# Patient Record
Sex: Female | Born: 1950 | Race: White | Hispanic: No | Marital: Married | Smoking: Never smoker
Health system: Southern US, Community
[De-identification: ages and names within clinical notes are randomized; demographics above are authoritative.]

## PROBLEM LIST (undated history)

## (undated) DIAGNOSIS — E039 Hypothyroidism, unspecified: Secondary | ICD-10-CM

## (undated) DIAGNOSIS — C50919 Malignant neoplasm of unspecified site of unspecified female breast: Secondary | ICD-10-CM

## (undated) DIAGNOSIS — Z923 Personal history of irradiation: Secondary | ICD-10-CM

## (undated) DIAGNOSIS — C801 Malignant (primary) neoplasm, unspecified: Secondary | ICD-10-CM

## (undated) DIAGNOSIS — C50411 Malignant neoplasm of upper-outer quadrant of right female breast: Secondary | ICD-10-CM

## (undated) DIAGNOSIS — C50412 Malignant neoplasm of upper-outer quadrant of left female breast: Secondary | ICD-10-CM

## (undated) DIAGNOSIS — IMO0001 Reserved for inherently not codable concepts without codable children: Secondary | ICD-10-CM

## (undated) DIAGNOSIS — D219 Benign neoplasm of connective and other soft tissue, unspecified: Secondary | ICD-10-CM

## (undated) DIAGNOSIS — IMO0002 Reserved for concepts with insufficient information to code with codable children: Secondary | ICD-10-CM

## (undated) HISTORY — PX: REFRACTIVE SURGERY: SHX103

## (undated) HISTORY — DX: Reserved for concepts with insufficient information to code with codable children: IMO0002

## (undated) HISTORY — PX: BREAST BIOPSY: SHX20

## (undated) HISTORY — PX: COLONOSCOPY: SHX174

## (undated) HISTORY — PX: BREAST SURGERY: SHX581

## (undated) HISTORY — DX: Reserved for inherently not codable concepts without codable children: IMO0001

## (undated) HISTORY — PX: TONSILLECTOMY AND ADENOIDECTOMY: SUR1326

## (undated) HISTORY — PX: BREAST LUMPECTOMY: SHX2

## (undated) HISTORY — PX: MYOMECTOMY: SHX85

## (undated) HISTORY — DX: Hypothyroidism, unspecified: E03.9

## (undated) HISTORY — DX: Benign neoplasm of connective and other soft tissue, unspecified: D21.9

---

## 2000-02-14 ENCOUNTER — Encounter: Admission: RE | Admit: 2000-02-14 | Discharge: 2000-02-14 | Payer: Self-pay | Admitting: Obstetrics and Gynecology

## 2000-02-14 ENCOUNTER — Encounter: Payer: Self-pay | Admitting: Obstetrics and Gynecology

## 2001-02-23 ENCOUNTER — Encounter: Payer: Self-pay | Admitting: Obstetrics and Gynecology

## 2001-02-23 ENCOUNTER — Encounter: Admission: RE | Admit: 2001-02-23 | Discharge: 2001-02-23 | Payer: Self-pay | Admitting: Obstetrics and Gynecology

## 2002-06-04 ENCOUNTER — Encounter: Payer: Self-pay | Admitting: Obstetrics and Gynecology

## 2002-06-04 ENCOUNTER — Encounter: Admission: RE | Admit: 2002-06-04 | Discharge: 2002-06-04 | Payer: Self-pay | Admitting: Obstetrics and Gynecology

## 2002-11-22 ENCOUNTER — Observation Stay (HOSPITAL_COMMUNITY): Admission: RE | Admit: 2002-11-22 | Discharge: 2002-11-23 | Payer: Self-pay | Admitting: Orthopedic Surgery

## 2003-06-07 ENCOUNTER — Encounter: Admission: RE | Admit: 2003-06-07 | Discharge: 2003-06-07 | Payer: Self-pay | Admitting: Obstetrics and Gynecology

## 2003-06-07 ENCOUNTER — Encounter: Payer: Self-pay | Admitting: Obstetrics and Gynecology

## 2003-09-24 HISTORY — PX: SHOULDER SURGERY: SHX246

## 2004-06-07 ENCOUNTER — Encounter: Admission: RE | Admit: 2004-06-07 | Discharge: 2004-06-07 | Payer: Self-pay | Admitting: Obstetrics and Gynecology

## 2005-06-18 ENCOUNTER — Encounter: Admission: RE | Admit: 2005-06-18 | Discharge: 2005-06-18 | Payer: Self-pay | Admitting: Obstetrics and Gynecology

## 2006-06-26 ENCOUNTER — Encounter: Admission: RE | Admit: 2006-06-26 | Discharge: 2006-06-26 | Payer: Self-pay | Admitting: Obstetrics and Gynecology

## 2007-06-29 ENCOUNTER — Encounter: Admission: RE | Admit: 2007-06-29 | Discharge: 2007-06-29 | Payer: Self-pay | Admitting: Obstetrics and Gynecology

## 2008-08-23 ENCOUNTER — Encounter: Admission: RE | Admit: 2008-08-23 | Discharge: 2008-08-23 | Payer: Self-pay | Admitting: Obstetrics and Gynecology

## 2009-09-18 ENCOUNTER — Encounter: Admission: RE | Admit: 2009-09-18 | Discharge: 2009-09-18 | Payer: Self-pay | Admitting: Internal Medicine

## 2010-10-03 ENCOUNTER — Encounter
Admission: RE | Admit: 2010-10-03 | Discharge: 2010-10-03 | Payer: Self-pay | Source: Home / Self Care | Attending: Internal Medicine | Admitting: Internal Medicine

## 2011-02-08 NOTE — Op Note (Signed)
   NAMEMEEGHAN, Valerie Heath                         ACCOUNT NO.:  [id number]   MEDICAL RECORD NO.:  [id number]                   PATIENT TYPE:  AMB   LOCATION:  DAY                                  FACILITY:  [facility]   PHYSICIAN:  Georges Lynch. Gioffre, M.D.             DATE OF BIRTH:  06/17/1951   DATE OF PROCEDURE:  11/22/2002  DATE OF DISCHARGE:                                 OPERATIVE REPORT   PREOPERATIVE DIAGNOSIS:  1. Small tear of the rotator cuff tendon on the left.  2. Severe impingement syndrome, left shoulder.   POSTOPERATIVE DIAGNOSIS:  1. Small tear of the rotator cuff tendon on the left.  2. Severe impingement syndrome, left shoulder.   OPERATION PERFORMED:  1. Open decompression by performing a partial acromionectomy, left shoulder.  2. Tear of a small hole in the rotator cuff tendon, left shoulder.   SURGEON:  Georges Lynch. Darrelyn Hillock, M.D.   ASSISTANT:  Ebbie Ridge. Paitsel, P.A.   ANESTHESIA:  General.   DESCRIPTION OF PROCEDURE:  Prior to general anesthesia the patient had an  interscalene block.  A routine orthopedic prep and drape of the left  shoulder was carried out.  Incision was made over the anterior aspect of the  left shoulder, the bleeders were identified and cauterized.  At this time  the deltoid tendon was stripped from the acromion by sharp dissection.  She  had a markedly thickened acromion that literally put a small hole in her  rotator cuff tendon. One could note that with abduction, she literally had  no room between the rotator cuff and the acromion.  So I then protected the  rotator cuff with the Bennett retractor and then did a partial  acromionectomy with the oscillating saw.  I then utilized the bur to even  out the raw bone and the uneven bone end.  We now had a sufficient amount of  room for abduction.  I was able to take her on through a good range of  motion.  I then removed the subdeltoid bursa and then repaired the small  hole in the rotator cuff  with #1 Tycron suture.  I thoroughly irrigated out  the area and then repaired the deltoid tendon and muscle in the usual  fashion.  Subcu was closed with 0 Vicryl, skin with metal staples.  A  sterile Neosporin dressing was applied.  She was placed in a sling.  She had  1g of IV Ancef preop.                                                Ronald A. Darrelyn Hillock, M.D.    RAG/MEDQ  D:  11/22/2002  T:  11/22/2002  Job:  [id number]

## 2011-04-11 ENCOUNTER — Other Ambulatory Visit: Payer: Self-pay | Admitting: Gynecology

## 2011-04-11 ENCOUNTER — Other Ambulatory Visit (HOSPITAL_COMMUNITY)
Admission: RE | Admit: 2011-04-11 | Discharge: 2011-04-11 | Disposition: A | Discharge status: Home / Self Care | Payer: 59 | Source: Ambulatory Visit | Attending: Gynecology | Admitting: Gynecology

## 2011-04-11 ENCOUNTER — Ambulatory Visit (INDEPENDENT_AMBULATORY_CARE_PROVIDER_SITE_OTHER): Payer: 59 | Admitting: Gynecology

## 2011-04-11 DIAGNOSIS — Z01419 Encounter for gynecological examination (general) (routine) without abnormal findings: Secondary | ICD-10-CM

## 2011-04-11 DIAGNOSIS — Z124 Encounter for screening for malignant neoplasm of cervix: Secondary | ICD-10-CM | POA: Insufficient documentation

## 2011-04-11 DIAGNOSIS — N952 Postmenopausal atrophic vaginitis: Secondary | ICD-10-CM

## 2011-04-16 ENCOUNTER — Encounter: Payer: Self-pay | Admitting: *Deleted

## 2011-10-16 ENCOUNTER — Other Ambulatory Visit: Payer: Self-pay | Admitting: Gynecology

## 2011-10-16 DIAGNOSIS — Z1231 Encounter for screening mammogram for malignant neoplasm of breast: Secondary | ICD-10-CM

## 2011-11-04 ENCOUNTER — Ambulatory Visit
Admission: RE | Admit: 2011-11-04 | Discharge: 2011-11-04 | Disposition: A | Discharge status: Home / Self Care | Payer: 59 | Source: Ambulatory Visit | Attending: Gynecology | Admitting: Gynecology

## 2011-11-04 DIAGNOSIS — Z1231 Encounter for screening mammogram for malignant neoplasm of breast: Secondary | ICD-10-CM

## 2012-04-14 ENCOUNTER — Encounter: Payer: Self-pay | Admitting: Gynecology

## 2012-04-14 ENCOUNTER — Ambulatory Visit (INDEPENDENT_AMBULATORY_CARE_PROVIDER_SITE_OTHER): Payer: BC Managed Care – PPO | Admitting: Gynecology

## 2012-04-14 VITALS — BP 124/70 | Ht 64.0 in | Wt 142.0 lb

## 2012-04-14 DIAGNOSIS — N952 Postmenopausal atrophic vaginitis: Secondary | ICD-10-CM

## 2012-04-14 DIAGNOSIS — Z01419 Encounter for gynecological examination (general) (routine) without abnormal findings: Secondary | ICD-10-CM

## 2012-04-14 NOTE — Progress Notes (Signed)
Valerie Heath [birth year] [id number]        61 y.o.  G0P0 for annual exam.  Doing well without complaints.  Past medical history,surgical history, medications, allergies, family history and social history were all reviewed and documented in the EPIC chart. ROS:  Was performed and pertinent positives and negatives are included in the history.  Exam: Kim assistant Filed Vitals:   04/14/12 1532  BP: 124/70  Height: 5\' 4"  (1.626 m)  Weight: 142 lb (64.411 kg)   General appearance  Normal Skin grossly normal Head/Neck normal with no cervical or supraclavicular adenopathy thyroid normal Lungs  clear Cardiac RR, without RMG Abdominal  soft, nontender, without masses, organomegaly or hernia Breasts  examined lying and sitting without masses, retractions, discharge or axillary adenopathy. Pelvic  Ext/BUS/vagina  normal mild atrophic changes  Cervix  normal   Uterus  anteverted, normal size, shape and contour, midline and mobile nontender   Adnexa  Without masses or tenderness    Anus and perineum  normal   Rectovaginal  normal sphincter tone without palpated masses or tenderness.    Assessment/Plan:  61 y.o. G0P0 female for annual exam.   1. Postmenopausal. Mild atrophic genital changes. Patient doing well without symptoms/bleeding. We'll continue to monitor. 2. Pap smear. Last Pap 2012. No Pap done today. No history of abnormal Pap smears before. We'll plan less frequent screening per current screening guidelines every 3-5 years. 3. Mammography. Patient had mammogram January 2013. Continue on an annual basis. SBE monthly reviewed. 4. DEXA. Patient had DEXA through Dr. Alver Fisher office last year. We'll follow up with him in reference to this. 5. Colonoscopy. Patient had colonoscopy 2 years ago. We'll follow up with their recommended screening interval. 6. Health maintenance. No blood work done today as it's all done through Dr. Leandro Reasoner office who actively sees her. Follow up in one year,  sooner as needed.    Dara Lords MD, 4:39 PM 04/14/2012

## 2012-04-14 NOTE — Patient Instructions (Signed)
Follow up in one year for annual gynecologic exam. 

## 2012-07-02 ENCOUNTER — Encounter: Payer: Self-pay | Admitting: Cardiology

## 2012-10-07 ENCOUNTER — Other Ambulatory Visit: Payer: Self-pay | Admitting: Gynecology

## 2012-10-07 DIAGNOSIS — Z1231 Encounter for screening mammogram for malignant neoplasm of breast: Secondary | ICD-10-CM

## 2012-11-06 ENCOUNTER — Ambulatory Visit: Payer: BC Managed Care – PPO

## 2012-11-09 ENCOUNTER — Ambulatory Visit
Admission: RE | Admit: 2012-11-09 | Discharge: 2012-11-09 | Disposition: A | Discharge status: Home / Self Care | Payer: BC Managed Care – PPO | Source: Ambulatory Visit | Attending: Gynecology | Admitting: Gynecology

## 2012-11-09 DIAGNOSIS — Z1231 Encounter for screening mammogram for malignant neoplasm of breast: Secondary | ICD-10-CM

## 2013-04-20 ENCOUNTER — Encounter: Payer: Self-pay | Admitting: Gynecology

## 2013-04-20 ENCOUNTER — Ambulatory Visit (INDEPENDENT_AMBULATORY_CARE_PROVIDER_SITE_OTHER): Payer: BC Managed Care – PPO | Admitting: Gynecology

## 2013-04-20 VITALS — BP 120/70 | Ht 65.0 in | Wt 144.0 lb

## 2013-04-20 DIAGNOSIS — L989 Disorder of the skin and subcutaneous tissue, unspecified: Secondary | ICD-10-CM

## 2013-04-20 DIAGNOSIS — N952 Postmenopausal atrophic vaginitis: Secondary | ICD-10-CM

## 2013-04-20 DIAGNOSIS — Z01419 Encounter for gynecological examination (general) (routine) without abnormal findings: Secondary | ICD-10-CM

## 2013-04-20 DIAGNOSIS — M899 Disorder of bone, unspecified: Secondary | ICD-10-CM

## 2013-04-20 DIAGNOSIS — M8580 Other specified disorders of bone density and structure, unspecified site: Secondary | ICD-10-CM

## 2013-04-20 NOTE — Patient Instructions (Signed)
Office will call you with biopsy results 

## 2013-04-20 NOTE — Progress Notes (Signed)
Valerie Heath Sep 29, 1950 [id number]        62 y.o.  G0P0 for annual exam.  Several issues noted below.  Past medical history,surgical history, medications, allergies, family history and social history were all reviewed and documented in the EPIC chart.  ROS:  Performed and pertinent positives and negatives are included in the history, assessment and plan .  Exam: Kim assistant Filed Vitals:   04/20/13 1131  BP: 120/70  Height: 5\' 5"  (1.651 m)  Weight: 144 lb (65.318 kg)   General appearance  Normal Skin grossly normal Head/Neck normal with no cervical or supraclavicular adenopathy thyroid normal Lungs  clear Cardiac RR, without RMG Abdominal  soft, nontender, without masses, organomegaly or hernia Breasts  examined lying and sitting without masses, retractions, discharge or axillary adenopathy. Pelvic  Ext/BUS/vagina  hyperkeratotic skin, thick plaque-like surrounding anus and extending to the perineal body.  Cervix  normal with atrophic changes  Uterus  anteverted, normal size, shape and contour, midline and mobile nontender   Adnexa  Without masses or tenderness    Anus and perineum  normal   Rectovaginal  normal sphincter tone without palpated masses or tenderness.    Assessment/Plan:  62 y.o. G0P0 female for annual exam.   1. Hyperkeratotic vulvar/perianal changes. Patient relates having pruritus chronically for years. Never appreciated to this extent previously. Discussed with patient she'll biopsy warranted and she agrees.  Subsequently representative area right lateral to anus cleansed with Betadine, infiltrated with 1% lidocaine and a small skin biopsy taken. Silver nitrate applied afterwards. Patient to followup for biopsy results and treatment plan. 2. Postmenopausal. Patient without significant symptoms of hot flushes night sweats vaginal dryness or dyspareunia. Patient knows to report any bleeding. 3. Osteopenia. Being followed by Dr. Clelia Croft and will continue to  followup with him. Increase calcium item and they discussed. 4. Pap smear 2012. No Pap smear done today. No history of abnormal Pap smears. Plan repeat Pap smear next year at 3 year interval. 5. Colonoscopy 2011. Repeated today are recommended interval. 6. Mammography 10/2012. Continued annual mammography. 7. Health maintenance. The lab work done as this is all done through Dr. Alver Fisher office. Followup for biopsy results.  Note: This document was prepared with digital dictation and possible smart phrase technology. Any transcriptional errors that result from this process are unintentional.   Dara Lords MD, 11:59 AM 04/20/2013

## 2013-04-23 ENCOUNTER — Other Ambulatory Visit: Payer: Self-pay | Admitting: Gynecology

## 2013-04-23 MED ORDER — CLOBETASOL PROPIONATE 0.05 % EX CREA: TOPICAL_CREAM | Freq: Every day | CUTANEOUS | Status: DC

## 2013-06-08 ENCOUNTER — Encounter: Payer: Self-pay | Admitting: Gynecology

## 2013-06-08 ENCOUNTER — Ambulatory Visit (INDEPENDENT_AMBULATORY_CARE_PROVIDER_SITE_OTHER): Payer: BC Managed Care – PPO | Admitting: Gynecology

## 2013-06-08 DIAGNOSIS — L900 Lichen sclerosus et atrophicus: Secondary | ICD-10-CM

## 2013-06-08 DIAGNOSIS — L940 Localized scleroderma [morphea]: Secondary | ICD-10-CM

## 2013-06-08 NOTE — Progress Notes (Signed)
Patient presents for followup exam. Had hyperkeratotic area July 2014 which was biopsied and showed lichen sclerosis. Has been using Temovate cream nightly and has done great with resolution of her itching irritated symptoms.  Exam with Selena Batten assistant External BUS vagina with whitish lichen sclerosis changes bilateral posterior fourchette and perianal region. No hyperkeratotic areas or other lesions. Vaginal exam with atrophic changes.  Assessment and plan: Lichen sclerosis, responding to Temovate. Will wean off of the Temovate now. Will use when necessary for symptom relief. Followup in July 2015 when due for annual exam. Sooner if any issues.

## 2013-06-08 NOTE — Patient Instructions (Signed)
Continue with the Temovate cream as needed. Follow up in July for your annual exam.

## 2013-11-29 ENCOUNTER — Other Ambulatory Visit: Payer: Self-pay

## 2013-11-29 DIAGNOSIS — Z1231 Encounter for screening mammogram for malignant neoplasm of breast: Secondary | ICD-10-CM

## 2013-11-29 DIAGNOSIS — Z803 Family history of malignant neoplasm of breast: Secondary | ICD-10-CM

## 2013-12-21 ENCOUNTER — Ambulatory Visit
Admission: RE | Admit: 2013-12-21 | Discharge: 2013-12-21 | Disposition: A | Discharge status: Home / Self Care | Payer: PRIVATE HEALTH INSURANCE | Source: Ambulatory Visit

## 2013-12-21 DIAGNOSIS — Z1231 Encounter for screening mammogram for malignant neoplasm of breast: Secondary | ICD-10-CM

## 2013-12-21 DIAGNOSIS — Z803 Family history of malignant neoplasm of breast: Secondary | ICD-10-CM

## 2013-12-23 ENCOUNTER — Other Ambulatory Visit: Payer: Self-pay | Admitting: Gynecology

## 2013-12-23 DIAGNOSIS — R928 Other abnormal and inconclusive findings on diagnostic imaging of breast: Secondary | ICD-10-CM

## 2013-12-29 ENCOUNTER — Other Ambulatory Visit: Payer: Self-pay | Admitting: Gynecology

## 2013-12-29 ENCOUNTER — Ambulatory Visit
Admission: RE | Admit: 2013-12-29 | Discharge: 2013-12-29 | Disposition: A | Discharge status: Home / Self Care | Payer: PRIVATE HEALTH INSURANCE | Source: Ambulatory Visit | Attending: Gynecology | Admitting: Gynecology

## 2013-12-29 ENCOUNTER — Ambulatory Visit
Admission: RE | Admit: 2013-12-29 | Discharge: 2013-12-29 | Disposition: A | Discharge status: Home / Self Care | Payer: Self-pay | Source: Ambulatory Visit | Attending: Gynecology | Admitting: Gynecology

## 2013-12-29 DIAGNOSIS — R928 Other abnormal and inconclusive findings on diagnostic imaging of breast: Secondary | ICD-10-CM

## 2013-12-29 DIAGNOSIS — N6320 Unspecified lump in the left breast, unspecified quadrant: Secondary | ICD-10-CM

## 2014-01-04 ENCOUNTER — Ambulatory Visit
Admission: RE | Admit: 2014-01-04 | Discharge: 2014-01-04 | Disposition: A | Discharge status: Home / Self Care | Payer: PRIVATE HEALTH INSURANCE | Source: Ambulatory Visit | Attending: Gynecology | Admitting: Gynecology

## 2014-01-04 ENCOUNTER — Other Ambulatory Visit: Payer: Self-pay | Admitting: Gynecology

## 2014-01-04 DIAGNOSIS — N6320 Unspecified lump in the left breast, unspecified quadrant: Secondary | ICD-10-CM

## 2014-01-04 DIAGNOSIS — R928 Other abnormal and inconclusive findings on diagnostic imaging of breast: Secondary | ICD-10-CM

## 2014-01-25 ENCOUNTER — Encounter (INDEPENDENT_AMBULATORY_CARE_PROVIDER_SITE_OTHER): Payer: Self-pay | Admitting: General Surgery

## 2014-01-25 ENCOUNTER — Ambulatory Visit (INDEPENDENT_AMBULATORY_CARE_PROVIDER_SITE_OTHER): Payer: PRIVATE HEALTH INSURANCE | Admitting: General Surgery

## 2014-01-25 VITALS — BP 140/90 | HR 80 | Temp 97.8°F | Resp 14 | Ht 65.0 in | Wt 146.8 lb

## 2014-01-25 DIAGNOSIS — N6092 Unspecified benign mammary dysplasia of left breast: Secondary | ICD-10-CM | POA: Insufficient documentation

## 2014-01-25 DIAGNOSIS — N62 Hypertrophy of breast: Secondary | ICD-10-CM

## 2014-01-25 NOTE — Progress Notes (Signed)
Patient ID: Valerie Heath, female   DOB: 15-Aug-1951, 63 y.o.   MRN: [id number]  Chief Complaint  Patient presents with  . New Evaluation    Lft br atypical lobular hyperplasia    HPI Valerie Heath is a 63 y.o. female.  She is referred by Dr. Luberta Robertson for evaluation of atypical lobular hyperplasia left breast, 2:00 position.  The patient has no prior history of breast problems. She gets yearly mammograms. Recent mammogram shows a 1 cm area of distortion in the left breast at the 2:00 position. Image guided biopsy shows atypical lobular hyperplasia. She is referred for consideration of lumpectomy.  In the history is significant in that her mother developed some type of gynecologic cancer and then later on developed breast cancer in her 86s and she is now living at age 81. Father died of congestive heart failure but did have prostate cancer  The patient is otherwise healthy. She had a myomectomy through a Pfannenstiel incision and has hypothyroidism. Denies tobacco. Married with 2 children. 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 HPI  Past Medical History  Diagnosis Date  . Hypothyroid   . Fibroid     Past Surgical History  Procedure Laterality Date  . Tonsillectomy and adenoidectomy    . Myomectomy    . Shoulder surgery      Family History  Problem Relation Age of Onset  . Hypertension Mother   . Breast cancer Mother 17  . Cancer Mother     Fallopian tube tumor  . Diabetes Father   . Hypertension Father   . Heart disease Father   . Prostate cancer Father   . Colon cancer Paternal Uncle     Social History History  Substance Use Topics  . Smoking status: Never Smoker   . Smokeless tobacco: Not on file  . Alcohol Use: 1.0 oz/week    2 drink(s) per week    Allergies  Allergen Reactions  . Erythromycin Nausea Only    Current Outpatient Prescriptions  Medication Sig Dispense Refill  . Calcium Carbonate-Vitamin D (CALCIUM + D PO) Take  by mouth.      . Cholecalciferol (VITAMIN D PO) Take by mouth.      . Flaxseed Oil OIL 1,400 mg by Does not apply route 3 (three) times a week.      Marland Kitchen glucosamine-chondroitin 500-400 MG tablet Take 1,875 tablets by mouth 5 (five) times daily.      Marland Kitchen levothyroxine (SYNTHROID, LEVOTHROID) 125 MCG tablet Take 125 mcg by mouth daily.        . Multiple Vitamin (MULTIVITAMIN) tablet Take 1 tablet by mouth daily.      . Omega-3 Fatty Acids (FISH OIL PO) Take by mouth.       No current facility-administered medications for this visit.    Review of Systems Review of Systems  Constitutional: Negative for fever, chills and unexpected weight change.  HENT: Negative for congestion, hearing loss, sore throat, trouble swallowing and voice change.   Eyes: Negative for visual disturbance.  Respiratory: Negative for cough and wheezing.   Cardiovascular: Negative for chest pain, palpitations and leg swelling.  Gastrointestinal: Negative for nausea, vomiting, abdominal pain, diarrhea, constipation, blood in stool, abdominal distention and anal bleeding.  Genitourinary: Negative for hematuria, vaginal bleeding and difficulty urinating.  Musculoskeletal: Negative for arthralgias.  Skin: Negative for rash and wound.  Neurological: Negative for seizures, syncope and headaches.  Hematological: Negative for adenopathy. Does not bruise/bleed easily.  Psychiatric/Behavioral: Negative  for confusion.    Blood pressure 140/90, pulse 80, temperature 97.8 F (36.6 C), temperature source Temporal, resp. rate 14, height 5\' 5"  (1.651 m), weight 146 lb 12.8 oz (66.588 kg).  Physical Exam Physical Exam  Constitutional: She is oriented to person, place, and time. She appears well-developed and well-nourished. No distress.  HENT:  Head: Normocephalic and atraumatic.  Nose: Nose normal.  Mouth/Throat: No oropharyngeal exudate.  Eyes: Conjunctivae and EOM are normal. Pupils are equal, round, and reactive to light. Left  eye exhibits no discharge. No scleral icterus.  Neck: Neck supple. No JVD present. No tracheal deviation present. No thyromegaly present.  Cardiovascular: Normal rate, regular rhythm, normal heart sounds and intact distal pulses.   No murmur heard. Pulmonary/Chest: Effort normal and breath sounds normal. No respiratory distress. She has no wheezes. She has no rales. She exhibits no tenderness.  Small bruise left breast, 2:30 position. No other mass or skin change in either breast and no axillary adenopathy.  Abdominal: Soft. Bowel sounds are normal. She exhibits no distension and no mass. There is no tenderness. There is no rebound and no guarding.  Healed Pfannenstiel scar  Musculoskeletal: She exhibits no edema and no tenderness.  Lymphadenopathy:    She has no cervical adenopathy.  Neurological: She is alert and oriented to person, place, and time. She exhibits normal muscle tone. Coordination normal.  Skin: Skin is warm. No rash noted. She is not diaphoretic. No erythema. No pallor.  Psychiatric: She has a normal mood and affect. Her behavior is normal. Judgment and thought content normal.    Data Reviewed Imaging studies. Pathology report  Assessment    Atypical lobular hyperplasia, 2:00 position left breast, 1 cm area by mammography     Plan    Scheduled for a left breast lumpectomy with radioactive seed localization  I discussed the indications, details, techniques, and numerous risk of the surgery with the patient. She is aware of the risk of bleeding, infection, reoperation if this is cancer, cosmetic deformity, nerve damage with chronic pain or numbness, and other unforeseen problems. She understands all these issues. All of her questions are answered. She agrees with this plan.        Edsel Petrin. Dalbert Batman, M.D., [facility] General and Minimally invasive Surgery Breast and Colorectal Surgery Office:   [phone] Pager:    [phone]  01/25/2014, 12:44 PM

## 2014-01-25 NOTE — Patient Instructions (Signed)
Your image guided biopsy of the left breast shows atypical lobular hyperplasia.  Because of your family history of breast cancer, and because there is a possibility that you have an early cancer in the left breast already, this area should be conservatively excised with a lumpectomy.  You will be scheduled for a left breast lumpectomy with radioactive seed localization in the future.     Lumpectomy A lumpectomy is a form of "breast conserving" or "breast preservation" surgery. It may also be referred to as a partial mastectomy. During a lumpectomy, the portion of the breast that contains the cancerous tumor or breast mass (the lump) is removed. Some normal tissue around the lump may also be removed to make sure all the tumor has been removed. This surgery should take 40 minutes or less. LET [facility] CARE PROVIDER KNOW ABOUT:  Any allergies you have.  All medicines you are taking, including vitamins, herbs, eye drops, creams, and over-the-counter medicines.  Previous problems you or members of your family have had with the use of anesthetics.  Any blood disorders you have.  Previous surgeries you have had.  Medical conditions you have. RISKS AND COMPLICATIONS Generally, this is a safe procedure. However, as with any procedure, complications can occur. Possible complications include:  Bleeding.  Infection.  Pain.  Temporary swelling.  Change in the shape of the breast, particularly if a large portion is removed. BEFORE THE PROCEDURE  Ask your health care provider about changing or stopping your regular medicines.  Do not eat or drink anything for 7 8 hours before the surgery or as directed by your health care provider. Ask your health care provider if you can take a sip of water with any approved medicines.  On the day of surgery, your healthcare provider will use a mammogram or ultrasound to locate and mark the tumor in your breast. These markings on your breast will show  where the cut (incision) will be made. PROCEDURE   An IV tube will be put into one of your veins.  You may be given medicine to help you relax before the surgery (sedative). You will be given one of the following:  A medicine that numbs the area (local anesthesia).  A medicine that makes you go to sleep (general anesthesia).  Your health care provider will use a kind of electric scalpel that uses heat to minimize bleeding (electrocautery knife).  A curved incision (like a smile or frown) that follows the natural curve of your breast is made, to allow for minimal scarring and better healing.  The tumor will be removed with some of the surrounding tissue. This will be sent to the lab for analysis. Your health care provider may also remove your lymph nodes at this time if needed.  Sometimes, but not always, a rubber tube called a drain will be surgically inserted into your breast area or armpit to collect excess fluid that may accumulate in the space where the tumor was. This drain is connected to a plastic bulb on the outside of your body. This drain creates suction to help remove the fluid.  The incisions will be closed with stitches (sutures).  A bandage may be placed over the incisions. AFTER THE PROCEDURE  You will be taken to the recovery area.  You will be given medicine for pain.  A small rubber drain may be placed in the breast for 2 3 days to prevent a collection of blood (hematoma) from developing in the breast. You will  be given instructions on caring for the drain before you go home.  A pressure bandage (dressing) will be applied for 1 2 days to prevent bleeding. Ask your health care provider how to care for your bandage at home. Document Released: 10/21/2006 Document Revised: 05/12/2013 Document Reviewed: 02/12/2013 [facility] Patient Information 2014 [place].

## 2014-01-26 ENCOUNTER — Other Ambulatory Visit (INDEPENDENT_AMBULATORY_CARE_PROVIDER_SITE_OTHER): Payer: Self-pay | Admitting: General Surgery

## 2014-01-26 DIAGNOSIS — N6092 Unspecified benign mammary dysplasia of left breast: Secondary | ICD-10-CM

## 2014-01-27 ENCOUNTER — Encounter (HOSPITAL_BASED_OUTPATIENT_CLINIC_OR_DEPARTMENT_OTHER): Payer: Self-pay | Admitting: *Deleted

## 2014-02-17 NOTE — H&P (Signed)
Valerie Heath    MRN:  [id number]   Description: 63 year old female  Provider: Adin Hector, MD  Department: [facility]                Diagnoses      Atypical lobular hyperplasia of left breast    -  Primary      611.1           Current Vitals Most recent update: 01/25/2014 11:59 AM by Elwyn Lade, CMA      BP Pulse Temp(Src) Resp Ht Wt      140/90 80 97.8 F (36.6 C) (Temporal) 14 5\' 5"  (1.651 m) 146 lb 12.8 oz (66.588 kg)      BMI - 24.43 kg/m2                   History and Physical    Adin Hector, MD     Status: Signed            Patient ID: Valerie Heath, female   DOB: [birth year], 63 y.o.   MRN: [id number]                 HPI Valerie Heath is a 63 y.o. female.  She is referred by Dr. Luberta Robertson for evaluation of atypical lobular hyperplasia left breast, 2:00 position.   The patient has no prior history of breast problems. She gets yearly mammograms. Recent mammogram shows a 1 cm area of distortion in the left breast at the 2:00 position. Image guided biopsy shows atypical lobular hyperplasia. She is referred for consideration of lumpectomy.   In the history is significant in that her mother developed some type of gynecologic cancer and then later on developed breast cancer in her 18s and she is now living at age 57. Father died of congestive heart failure but did have prostate cancer   The patient is otherwise healthy. She had a myomectomy through a Pfannenstiel incision and has hypothyroidism. Denies tobacco. Married with 2 children. 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        Past Medical History   Diagnosis  Date   .  Hypothyroid     .  Fibroid           Past Surgical History   Procedure  Laterality  Date   .  Tonsillectomy and adenoidectomy       .  Myomectomy       .  Shoulder surgery             Family History   Problem  Relation  Age of Onset   .  Hypertension  Mother     .  Breast  cancer  Mother  66   .  Cancer  Mother         Fallopian tube tumor   .  Diabetes  Father     .  Hypertension  Father     .  Heart disease  Father     .  Prostate cancer  Father     .  Colon cancer  Paternal Uncle          Social History History   Substance Use Topics   .  Smoking status:  Never Smoker    .  Smokeless tobacco:  Not on file   .  Alcohol Use:  1.0 oz/week       2 drink(s) per week  Allergies   Allergen  Reactions   .  Erythromycin  Nausea Only         Current Outpatient Prescriptions   Medication  Sig  Dispense  Refill   .  Calcium Carbonate-Vitamin D (CALCIUM + D PO)  Take by mouth.         .  Cholecalciferol (VITAMIN D PO)  Take by mouth.         .  Flaxseed Oil OIL  1,400 mg by Does not apply route 3 (three) times a week.         Marland Kitchen  glucosamine-chondroitin 500-400 MG tablet  Take 1,875 tablets by mouth 5 (five) times daily.         Marland Kitchen  levothyroxine (SYNTHROID, LEVOTHROID) 125 MCG tablet  Take 125 mcg by mouth daily.           .  Multiple Vitamin (MULTIVITAMIN) tablet  Take 1 tablet by mouth daily.         .  Omega-3 Fatty Acids (FISH OIL PO)  Take by mouth.       .        Review of Systems   Constitutional: Negative for fever, chills and unexpected weight change.  HENT: Negative for congestion, hearing loss, sore throat, trouble swallowing and voice change.   Eyes: Negative for visual disturbance.  Respiratory: Negative for cough and wheezing.   Cardiovascular: Negative for chest pain, palpitations and leg swelling.  Gastrointestinal: Negative for nausea, vomiting, abdominal pain, diarrhea, constipation, blood in stool, abdominal distention and anal bleeding.  Genitourinary: Negative for hematuria, vaginal bleeding and difficulty urinating.  Musculoskeletal: Negative for arthralgias.  Skin: Negative for rash and wound.  Neurological: Negative for seizures, syncope and headaches.  Hematological: Negative for adenopathy. Does not  bruise/bleed easily.  Psychiatric/Behavioral: Negative for confusion.      Blood pressure 140/90, pulse 80, temperature 97.8 F (36.6 C), temperature source Temporal, resp. rate 14, height 5\' 5"  (1.651 m), weight 146 lb 12.8 oz (66.588 kg).   Physical Exam   Constitutional: She is oriented to person, place, and time. She appears well-developed and well-nourished. No distress.  HENT:   Head: Normocephalic and atraumatic.   Nose: Nose normal.   Mouth/Throat: No oropharyngeal exudate.  Eyes: Conjunctivae and EOM are normal. Pupils are equal, round, and reactive to light. Left eye exhibits no discharge. No scleral icterus.  Neck: Neck supple. No JVD present. No tracheal deviation present. No thyromegaly present.  Cardiovascular: Normal rate, regular rhythm, normal heart sounds and intact distal pulses.    No murmur heard. Pulmonary/Chest: Effort normal and breath sounds normal. No respiratory distress. She has no wheezes. She has no rales. She exhibits no tenderness.  Small bruise left breast, 2:30 position. No other mass or skin change in either breast and no axillary adenopathy.  Abdominal: Soft. Bowel sounds are normal. She exhibits no distension and no mass. There is no tenderness. There is no rebound and no guarding.  Healed Pfannenstiel scar  Musculoskeletal: She exhibits no edema and no tenderness.  Lymphadenopathy:    She has no cervical adenopathy.  Neurological: She is alert and oriented to person, place, and time. She exhibits normal muscle tone. Coordination normal.  Skin: Skin is warm. No rash noted. She is not diaphoretic. No erythema. No pallor.  Psychiatric: She has a normal mood and affect. Her behavior is normal. Judgment and thought content normal.      Data Reviewed Imaging studies. Pathology report   Assessment  Atypical lobular hyperplasia, 2:00 position left breast, 1 cm area by mammography      Plan    Scheduled for a left breast lumpectomy with  radioactive seed localization   I discussed the indications, details, techniques, and numerous risk of the surgery with the patient. She is aware of the risk of bleeding, infection, reoperation if this is cancer, cosmetic deformity, nerve damage with chronic pain or numbness, and other unforeseen problems. She understands all these issues. All of her questions are answered. She agrees with this plan.         Edsel Petrin. Dalbert Batman, M.D., [facility] General and Minimally invasive Surgery Breast and Colorectal Surgery Office:   [phone] Pager:   [phone]

## 2014-02-21 ENCOUNTER — Ambulatory Visit
Admission: RE | Admit: 2014-02-21 | Discharge: 2014-02-21 | Disposition: A | Discharge status: Home / Self Care | Payer: PRIVATE HEALTH INSURANCE | Source: Ambulatory Visit | Attending: General Surgery | Admitting: General Surgery

## 2014-02-21 ENCOUNTER — Encounter (HOSPITAL_BASED_OUTPATIENT_CLINIC_OR_DEPARTMENT_OTHER)
Admission: RE | Disposition: A | Discharge status: Home / Self Care | Payer: Self-pay | Source: Ambulatory Visit | Attending: General Surgery

## 2014-02-21 ENCOUNTER — Encounter (HOSPITAL_BASED_OUTPATIENT_CLINIC_OR_DEPARTMENT_OTHER): Payer: No Typology Code available for payment source | Admitting: Anesthesiology

## 2014-02-21 ENCOUNTER — Ambulatory Visit (HOSPITAL_BASED_OUTPATIENT_CLINIC_OR_DEPARTMENT_OTHER): Payer: No Typology Code available for payment source | Admitting: Anesthesiology

## 2014-02-21 ENCOUNTER — Ambulatory Visit (HOSPITAL_BASED_OUTPATIENT_CLINIC_OR_DEPARTMENT_OTHER)
Admission: RE | Admit: 2014-02-21 | Discharge: 2014-02-21 | Disposition: A | Discharge status: Home / Self Care | Payer: No Typology Code available for payment source | Source: Ambulatory Visit | Attending: General Surgery | Admitting: General Surgery

## 2014-02-21 ENCOUNTER — Encounter (HOSPITAL_BASED_OUTPATIENT_CLINIC_OR_DEPARTMENT_OTHER): Payer: Self-pay

## 2014-02-21 DIAGNOSIS — Z170 Estrogen receptor positive status [ER+]: Secondary | ICD-10-CM | POA: Insufficient documentation

## 2014-02-21 DIAGNOSIS — Z803 Family history of malignant neoplasm of breast: Secondary | ICD-10-CM | POA: Insufficient documentation

## 2014-02-21 DIAGNOSIS — Z8049 Family history of malignant neoplasm of other genital organs: Secondary | ICD-10-CM | POA: Insufficient documentation

## 2014-02-21 DIAGNOSIS — Z881 Allergy status to other antibiotic agents status: Secondary | ICD-10-CM | POA: Insufficient documentation

## 2014-02-21 DIAGNOSIS — N6092 Unspecified benign mammary dysplasia of left breast: Secondary | ICD-10-CM | POA: Diagnosis present

## 2014-02-21 DIAGNOSIS — C50419 Malignant neoplasm of upper-outer quadrant of unspecified female breast: Secondary | ICD-10-CM | POA: Insufficient documentation

## 2014-02-21 DIAGNOSIS — E039 Hypothyroidism, unspecified: Secondary | ICD-10-CM | POA: Insufficient documentation

## 2014-02-21 DIAGNOSIS — Z79899 Other long term (current) drug therapy: Secondary | ICD-10-CM | POA: Insufficient documentation

## 2014-02-21 DIAGNOSIS — D0590 Unspecified type of carcinoma in situ of unspecified breast: Secondary | ICD-10-CM

## 2014-02-21 HISTORY — PX: BREAST LUMPECTOMY WITH RADIOACTIVE SEED LOCALIZATION: SHX6424

## 2014-02-21 LAB — POCT HEMOGLOBIN-HEMACUE: Hemoglobin: 13.6 g/dL (ref 12.0–15.0)

## 2014-02-21 SURGERY — BREAST LUMPECTOMY WITH RADIOACTIVE SEED LOCALIZATION
Anesthesia: General | Site: Breast | Laterality: Left

## 2014-02-21 MED ORDER — OXYCODONE HCL 5 MG/5ML PO SOLN: 5.0000 mg | Freq: Once | ORAL | Status: AC | PRN

## 2014-02-21 MED ORDER — FENTANYL CITRATE 0.05 MG/ML IJ SOLN
INTRAMUSCULAR | Status: AC
  Filled 2014-02-21: qty 4

## 2014-02-21 MED ORDER — HYDROMORPHONE HCL PF 1 MG/ML IJ SOLN
INTRAMUSCULAR | Status: AC
  Filled 2014-02-21: qty 1

## 2014-02-21 MED ORDER — HYDROMORPHONE HCL PF 1 MG/ML IJ SOLN
0.2500 mg | INTRAMUSCULAR | Status: DC | PRN
  Administered 2014-02-21 (×2): 0.5 mg via INTRAVENOUS

## 2014-02-21 MED ORDER — DEXAMETHASONE SODIUM PHOSPHATE 4 MG/ML IJ SOLN
INTRAMUSCULAR | Status: DC | PRN
  Administered 2014-02-21: 10 mg via INTRAVENOUS

## 2014-02-21 MED ORDER — MEPERIDINE HCL 25 MG/ML IJ SOLN: 6.2500 mg | INTRAMUSCULAR | Status: DC | PRN

## 2014-02-21 MED ORDER — OXYCODONE HCL 5 MG PO TABS
ORAL_TABLET | ORAL | Status: AC
  Filled 2014-02-21: qty 1

## 2014-02-21 MED ORDER — MIDAZOLAM HCL 2 MG/2ML IJ SOLN
INTRAMUSCULAR | Status: AC
  Filled 2014-02-21: qty 2

## 2014-02-21 MED ORDER — BUPIVACAINE-EPINEPHRINE (PF) 0.5% -1:200000 IJ SOLN
INTRAMUSCULAR | Status: DC | PRN
  Administered 2014-02-21: 8 mL

## 2014-02-21 MED ORDER — CEFAZOLIN SODIUM-DEXTROSE 2-3 GM-% IV SOLR
2.0000 g | INTRAVENOUS | Status: AC
  Administered 2014-02-21: 2 g via INTRAVENOUS

## 2014-02-21 MED ORDER — MIDAZOLAM HCL 2 MG/2ML IJ SOLN: 1.0000 mg | INTRAMUSCULAR | Status: DC | PRN

## 2014-02-21 MED ORDER — CEFAZOLIN SODIUM-DEXTROSE 2-3 GM-% IV SOLR
INTRAVENOUS | Status: AC
  Filled 2014-02-21: qty 50

## 2014-02-21 MED ORDER — ONDANSETRON HCL 4 MG/2ML IJ SOLN
INTRAMUSCULAR | Status: DC | PRN
  Administered 2014-02-21: 4 mg via INTRAVENOUS

## 2014-02-21 MED ORDER — LACTATED RINGERS IV SOLN
INTRAVENOUS | Status: DC
  Administered 2014-02-21: 10:00:00 via INTRAVENOUS

## 2014-02-21 MED ORDER — PROMETHAZINE HCL 25 MG/ML IJ SOLN: 6.2500 mg | INTRAMUSCULAR | Status: DC | PRN

## 2014-02-21 MED ORDER — FENTANYL CITRATE 0.05 MG/ML IJ SOLN
INTRAMUSCULAR | Status: DC | PRN
  Administered 2014-02-21: 100 ug via INTRAVENOUS

## 2014-02-21 MED ORDER — PROPOFOL 10 MG/ML IV BOLUS
INTRAVENOUS | Status: AC
  Filled 2014-02-21: qty 20

## 2014-02-21 MED ORDER — CHLORHEXIDINE GLUCONATE 4 % EX LIQD: 1.0000 "application " | Freq: Once | CUTANEOUS | Status: DC

## 2014-02-21 MED ORDER — OXYCODONE HCL 5 MG PO TABS
5.0000 mg | ORAL_TABLET | Freq: Once | ORAL | Status: AC | PRN
  Administered 2014-02-21: 5 mg via ORAL

## 2014-02-21 MED ORDER — FENTANYL CITRATE 0.05 MG/ML IJ SOLN: 50.0000 ug | INTRAMUSCULAR | Status: DC | PRN

## 2014-02-21 MED ORDER — PROPOFOL 10 MG/ML IV BOLUS
INTRAVENOUS | Status: DC | PRN
  Administered 2014-02-21: 140 mg via INTRAVENOUS

## 2014-02-21 MED ORDER — MIDAZOLAM HCL 5 MG/5ML IJ SOLN
INTRAMUSCULAR | Status: DC | PRN
  Administered 2014-02-21: 2 mg via INTRAVENOUS

## 2014-02-21 MED ORDER — HYDROCODONE-ACETAMINOPHEN 5-325 MG PO TABS: 1.0000 | ORAL_TABLET | Freq: Four times a day (QID) | ORAL | Status: DC | PRN

## 2014-02-21 MED ORDER — MIDAZOLAM HCL 2 MG/2ML IJ SOLN: 0.5000 mg | Freq: Once | INTRAMUSCULAR | Status: DC | PRN

## 2014-02-21 MED ORDER — LIDOCAINE HCL (CARDIAC) 20 MG/ML IV SOLN
INTRAVENOUS | Status: DC | PRN
  Administered 2014-02-21: 40 mg via INTRAVENOUS

## 2014-02-21 SURGICAL SUPPLY — 67 items
ADH SKN CLS APL DERMABOND .7 (GAUZE/BANDAGES/DRESSINGS) ×1
APL SKNCLS STERI-STRIP NONHPOA (GAUZE/BANDAGES/DRESSINGS)
APPLIER CLIP 9.375 MED OPEN (MISCELLANEOUS)
APR CLP MED 9.3 20 MLT OPN (MISCELLANEOUS)
BENZOIN TINCTURE PRP APPL 2/3 (GAUZE/BANDAGES/DRESSINGS) IMPLANT
BINDER BREAST LRG (GAUZE/BANDAGES/DRESSINGS) ×1 IMPLANT
BINDER BREAST MEDIUM (GAUZE/BANDAGES/DRESSINGS) IMPLANT
BINDER BREAST XLRG (GAUZE/BANDAGES/DRESSINGS) IMPLANT
BINDER BREAST XXLRG (GAUZE/BANDAGES/DRESSINGS) IMPLANT
BLADE 10 SAFETY STRL DISP (BLADE) IMPLANT
BLADE 15 SAFETY STRL DISP (BLADE) ×2 IMPLANT
BLADE HEX COATED 2.75 (ELECTRODE) ×2 IMPLANT
CANISTER SUC SOCK COL 7IN (MISCELLANEOUS) ×2 IMPLANT
CANISTER SUCT 1200ML W/VALVE (MISCELLANEOUS) ×2 IMPLANT
CHLORAPREP W/TINT 26ML (MISCELLANEOUS) ×2 IMPLANT
CLIP APPLIE 9.375 MED OPEN (MISCELLANEOUS) IMPLANT
COVER MAYO STAND STRL (DRAPES) ×2 IMPLANT
COVER PROBE W GEL 5X96 (DRAPES) ×2 IMPLANT
COVER TABLE BACK 60X90 (DRAPES) ×2 IMPLANT
DECANTER SPIKE VIAL GLASS SM (MISCELLANEOUS) IMPLANT
DERMABOND ADVANCED (GAUZE/BANDAGES/DRESSINGS) ×1
DERMABOND ADVANCED .7 DNX12 (GAUZE/BANDAGES/DRESSINGS) ×1 IMPLANT
DEVICE DUBIN W/COMP PLATE 8390 (MISCELLANEOUS) ×2 IMPLANT
DRAIN CHANNEL 19F RND (DRAIN) IMPLANT
DRAPE LAPAROSCOPIC ABDOMINAL (DRAPES) ×2 IMPLANT
DRAPE UTILITY XL STRL (DRAPES) ×2 IMPLANT
DRSG PAD ABDOMINAL 8X10 ST (GAUZE/BANDAGES/DRESSINGS) IMPLANT
ELECT REM PT RETURN 9FT ADLT (ELECTROSURGICAL) ×2
ELECTRODE REM PT RTRN 9FT ADLT (ELECTROSURGICAL) ×1 IMPLANT
EVACUATOR SILICONE 100CC (DRAIN) IMPLANT
GLOVE BIO SURGEON STRL SZ 6.5 (GLOVE) ×2 IMPLANT
GLOVE BIOGEL M STRL SZ7.5 (GLOVE) ×1 IMPLANT
GLOVE BIOGEL PI IND STRL 7.0 (GLOVE) IMPLANT
GLOVE BIOGEL PI IND STRL 8 (GLOVE) IMPLANT
GLOVE BIOGEL PI INDICATOR 7.0 (GLOVE) ×1
GLOVE BIOGEL PI INDICATOR 8 (GLOVE) ×1
GLOVE EUDERMIC 7 POWDERFREE (GLOVE) ×2 IMPLANT
GOWN STRL REUS W/ TWL LRG LVL3 (GOWN DISPOSABLE) ×1 IMPLANT
GOWN STRL REUS W/ TWL XL LVL3 (GOWN DISPOSABLE) ×1 IMPLANT
GOWN STRL REUS W/TWL LRG LVL3 (GOWN DISPOSABLE) ×2
GOWN STRL REUS W/TWL XL LVL3 (GOWN DISPOSABLE) ×2
KIT MARKER MARGIN INK (KITS) IMPLANT
NDL HYPO 25X1 1.5 SAFETY (NEEDLE) IMPLANT
NEEDLE HYPO 25X1 1.5 SAFETY (NEEDLE) ×2 IMPLANT
NS IRRIG 1000ML POUR BTL (IV SOLUTION) ×2 IMPLANT
PACK BASIN DAY SURGERY FS (CUSTOM PROCEDURE TRAY) ×2 IMPLANT
PENCIL BUTTON HOLSTER BLD 10FT (ELECTRODE) ×2 IMPLANT
PIN SAFETY STERILE (MISCELLANEOUS) ×1 IMPLANT
SHEET MEDIUM DRAPE 40X70 STRL (DRAPES) IMPLANT
SLEEVE SCD COMPRESS KNEE MED (MISCELLANEOUS) ×2 IMPLANT
SPONGE GAUZE 4X4 12PLY STER LF (GAUZE/BANDAGES/DRESSINGS) IMPLANT
SPONGE LAP 18X18 X RAY DECT (DISPOSABLE) IMPLANT
SPONGE LAP 4X18 X RAY DECT (DISPOSABLE) ×2 IMPLANT
STRIP CLOSURE SKIN 1/2X4 (GAUZE/BANDAGES/DRESSINGS) IMPLANT
SUT ETHILON 3 0 FSL (SUTURE) IMPLANT
SUT MNCRL AB 4-0 PS2 18 (SUTURE) ×2 IMPLANT
SUT SILK 2 0 SH (SUTURE) ×2 IMPLANT
SUT VIC AB 2-0 CT1 27 (SUTURE)
SUT VIC AB 2-0 CT1 TAPERPNT 27 (SUTURE) IMPLANT
SUT VIC AB 3-0 SH 27 (SUTURE)
SUT VIC AB 3-0 SH 27X BRD (SUTURE) IMPLANT
SUT VICRYL 3-0 CR8 SH (SUTURE) ×2 IMPLANT
SYR CONTROL 10ML LL (SYRINGE) ×1 IMPLANT
TOWEL OR 17X24 6PK STRL BLUE (TOWEL DISPOSABLE) ×2 IMPLANT
TOWEL OR NON WOVEN STRL DISP B (DISPOSABLE) ×2 IMPLANT
TUBE CONNECTING 20X1/4 (TUBING) ×2 IMPLANT
YANKAUER SUCT BULB TIP NO VENT (SUCTIONS) ×2 IMPLANT

## 2014-02-21 NOTE — Transfer of Care (Signed)
Immediate Anesthesia Transfer of Care Note  Patient: Valerie Heath  Procedure(s) Performed: Procedure(s): BREAST LUMPECTOMY WITH RADIOACTIVE SEED LOCALIZATION (Left)  Patient Location: PACU  Anesthesia Type:General  Level of Consciousness: awake, oriented and patient cooperative  Airway & Oxygen Therapy: Patient Spontanous Breathing and Patient connected to face mask  Post-op Assessment: Report given to PACU RN and Post -op Vital signs reviewed and stable  Post vital signs: Reviewed and stable  Complications: No apparent anesthesia complications

## 2014-02-21 NOTE — Discharge Instructions (Signed)
[facility] °Office Phone Number [phone] ° °BREAST BIOPSY/ PARTIAL MASTECTOMY: POST OP INSTRUCTIONS ° °Always review your discharge instruction sheet given to you by the facility where your surgery was performed. ° °IF YOU HAVE DISABILITY OR FAMILY LEAVE FORMS, YOU MUST BRING THEM TO THE OFFICE FOR PROCESSING.  DO NOT GIVE THEM TO YOUR DOCTOR. ° °1. A prescription for pain medication may be given to you upon discharge.  Take your pain medication as prescribed, if needed.  If narcotic pain medicine is not needed, then you may take acetaminophen (Tylenol) or ibuprofen (Advil) as needed. °2. Take your usually prescribed medications unless otherwise directed °3. If you need a refill on your pain medication, please contact your pharmacy.  They will contact our office to request authorization.  Prescriptions will not be filled after 5pm or on week-ends. °4. You should eat very light the first 24 hours after surgery, such as soup, crackers, pudding, etc.  Resume your normal diet the day after surgery. °5. Most patients will experience some swelling and bruising in the breast.  Ice packs and a good support bra will help.  Swelling and bruising can take several days to resolve.  °6. It is common to experience some constipation if taking pain medication after surgery.  Increasing fluid intake and taking a stool softener will usually help or prevent this problem from occurring.  A mild laxative (Milk of Magnesia or Miralax) should be taken according to package directions if there are no bowel movements after 48 hours. °7. Unless discharge instructions indicate otherwise, you may remove your bandages 24-48 hours after surgery, and you may shower at that time.  You may have steri-strips (small skin tapes) in place directly over the incision.  These strips should be left on the skin for 7-10 days.  If your surgeon used skin glue on the incision, you may shower in 24 hours.  The glue will flake off over the  next 2-3 weeks.  Any sutures or staples will be removed at the office during your follow-up visit. °8. ACTIVITIES:  You may resume regular daily activities (gradually increasing) beginning the next day.  Wearing a good support bra or sports bra minimizes pain and swelling.  You may have sexual intercourse when it is comfortable. °a. You may drive when you no longer are taking prescription pain medication, you can comfortably wear a seatbelt, and you can safely maneuver your car and apply brakes. °b. RETURN TO WORK:  ______________________________________________________________________________________ °9. You should see your doctor in the office for a follow-up appointment approximately two weeks after your surgery.  Your doctor’s nurse will typically make your follow-up appointment when she calls you with your pathology report.  Expect your pathology report 2-3 business days after your surgery.  You may call to check if you do not hear from us after three days. °10. OTHER INSTRUCTIONS: _______________________________________________________________________________________________ _____________________________________________________________________________________________________________________________________ °_____________________________________________________________________________________________________________________________________ °_____________________________________________________________________________________________________________________________________ ° °WHEN TO CALL YOUR DOCTOR: °1. Fever over 101.0 °2. Nausea and/or vomiting. °3. Extreme swelling or bruising. °4. Continued bleeding from incision. °5. Increased pain, redness, or drainage from the incision. ° °The clinic staff is available to answer your questions during regular business hours.  Please don’t hesitate to call and ask to speak to one of the nurses for clinical concerns.  If you have a medical emergency, go to the nearest  emergency room or call 911.  A surgeon from [facility] is always on call at the hospital. ° °For further questions, please visit [email or web address]  ° ° °  Post Anesthesia Home Care Instructions ° °Activity: °Get plenty of rest for the remainder of the day. A responsible adult should stay with you for 24 hours following the procedure.  °For the next 24 hours, DO NOT: °-Drive a car °-Operate machinery °-Drink alcoholic beverages °-Take any medication unless instructed by your physician °-Make any legal decisions or sign important papers. ° °Meals: °Start with liquid foods such as gelatin or soup. Progress to regular foods as tolerated. Avoid greasy, spicy, heavy foods. If nausea and/or vomiting occur, drink only clear liquids until the nausea and/or vomiting subsides. Call your physician if vomiting continues. ° °Special Instructions/Symptoms: °Your throat may feel dry or sore from the anesthesia or the breathing tube placed in your throat during surgery. If this causes discomfort, gargle with warm salt water. The discomfort should disappear within 24 hours. ° °

## 2014-02-21 NOTE — Anesthesia Procedure Notes (Signed)
Procedure Name: LMA Insertion Date/Time: 02/21/2014 10:15 AM Performed by: Toula Moos L Pre-anesthesia Checklist: Patient identified, Emergency Drugs available, Suction available, Patient being monitored and Timeout performed Patient Re-evaluated:Patient Re-evaluated prior to inductionOxygen Delivery Method: Circle System Utilized Preoxygenation: Pre-oxygenation with 100% oxygen Intubation Type: IV induction Ventilation: Mask ventilation without difficulty LMA: LMA inserted LMA Size: 4.0 Number of attempts: 1 Airway Equipment and Method: bite block Placement Confirmation: positive ETCO2 and breath sounds checked- equal and bilateral Tube secured with: Tape Dental Injury: Teeth and Oropharynx as per pre-operative assessment

## 2014-02-21 NOTE — Anesthesia Preprocedure Evaluation (Addendum)
Anesthesia Evaluation  Patient identified by MRN, date of birth, ID band Patient awake    Reviewed: Allergy & Precautions, H&P , NPO status , Patient's Chart, lab work & pertinent test results  History of Anesthesia Complications Negative for: history of anesthetic complications  Airway Mallampati: II TM Distance: >3 FB Neck ROM: Full    Dental  (+) Teeth Intact, Dental Advisory Given   Pulmonary neg pulmonary ROS,  breath sounds clear to auscultation  Pulmonary exam normal       Cardiovascular - anginanegative cardio ROS  Rhythm:Regular Rate:Normal     Neuro/Psych negative neurological ROS     GI/Hepatic negative GI ROS, Neg liver ROS,   Endo/Other  Hypothyroidism   Renal/GU negative Renal ROS     Musculoskeletal   Abdominal   Peds  Hematology negative hematology ROS (+)   Anesthesia Other Findings   Reproductive/Obstetrics                          Anesthesia Physical Anesthesia Plan  ASA: II  Anesthesia Plan: General   Post-op Pain Management:    Induction: Intravenous  Airway Management Planned: LMA  Additional Equipment:   Intra-op Plan:   Post-operative Plan:   Informed Consent: I have reviewed the patients History and Physical, chart, labs and discussed the procedure including the risks, benefits and alternatives for the proposed anesthesia with the patient or authorized representative who has indicated his/her understanding and acceptance.   Dental advisory given  Plan Discussed with: CRNA and Surgeon  Anesthesia Plan Comments: (Plan routine monitors, GA- LMA OK)        Anesthesia Quick Evaluation

## 2014-02-21 NOTE — Anesthesia Postprocedure Evaluation (Signed)
  Anesthesia Post-op Note  Patient: Printmaker  Procedure(s) Performed: Procedure(s): BREAST LUMPECTOMY WITH RADIOACTIVE SEED LOCALIZATION (Left)  Patient Location: PACU  Anesthesia Type:General  Level of Consciousness: awake, alert , oriented and patient cooperative  Airway and Oxygen Therapy: Patient Spontanous Breathing  Post-op Pain: none  Post-op Assessment: Post-op Vital signs reviewed, Patient's Cardiovascular Status Stable, Respiratory Function Stable, Patent Airway, No signs of Nausea or vomiting and Pain level controlled  Post-op Vital Signs: Reviewed and stable  Last Vitals:  Filed Vitals:   02/21/14 1230  BP: 132/66  Pulse: 66  Temp: 36.4 C  Resp: 16    Complications: No apparent anesthesia complications

## 2014-02-21 NOTE — Op Note (Signed)
Patient Name:           Valerie Heath   Date of Surgery:        02/21/2014  Note: This dictation was prepared with Dragon/digital dictation along with [facility] technology. Any transcriptional errors that result from this process are unintentional.  Pre op Diagnosis:      Atypical lobular hyperplasia left breast, upper outer quadrant  Post op Diagnosis:    Same  Procedure:                 Left breast lumpectomy with radioactive seed localization  Surgeon:                     Edsel Petrin. Dalbert Batman, M.D., FACS  Assistant:                      None  Operative Indications:   Valerie Heath is a 63 y.o. female. She is referred by Dr. Luberta Robertson for evaluation of atypical lobular hyperplasia left breast, 2:00 position.  The patient has no prior history of breast problems. She gets yearly mammograms. Recent mammogram shows a 1 cm area of distortion in the left breast at the 2:00 position. Image guided biopsy shows atypical lobular hyperplasia. She is referred for consideration of lumpectomy. Physical exam is unremarkable except for the biopsy site. There is no mass, skin changes, or adenopathy. In the history is significant in that her mother developed some type of gynecologic cancer and then later on developed breast cancer in her 3s and she is now living at age 85. Father died of congestive heart failure but did have prostate cancer  The patient is otherwise healthy. She had a myomectomy through a Pfannenstiel incision and has hypothyroidism. Denies tobacco. Married with 2 children. 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   Operative Findings:       A single radioactive seed was noted on the preoperative imaging. A single radioactive seed was noted on the specimen mammogram. The radioactivity was confined to the lumpectomy tissue and there was no residual radioactivity in the left breast postoperatively. The marker clip and the radioactive seed appeared to be in the  center of the specimen.  Procedure in Detail:          The radioactive seed was placed a few days ago. In the preop area I was able to use the neoprobe and isolate the radioactivity to the upper outer quadrant left breast. The patient was taken to the operating room and underwent general anesthesia with LMA device. The left breast was prepped and draped in sterile fashion. Antibiotics were given. Surgical time out was performed. Using the neoprobe I isolated an area of maximum radioactivity and made a curvilinear incision in the upper outer quadrant of the left breast at that site. Dissection was carried down into the breast tissue. I used the neoprobe frequently to guide  the dissection. A spherical piece of tissue was removed and marked with silk sutures to orient the pathologist in 3 cardinal positions. Specimen mammogram looked good with a marker clip and the  seed nearly in the exact center of the specimen. Specimen was marked with pins and sent to the lab. Hemostasis was excellent and achieved with electrocautery. The wound was irrigated with saline. The breast tissues were later closed in multiple layers with interrupted 3-0 Vicryl sutures and the skin closed with a running subcuticular 4-0 Monocryl suture and Dermabond. A breast binder  was placed the patient taken to PACU in stable condition. EBL 15 cc. Counts correct. Complications none.     Edsel Petrin. Dalbert Batman, M.D., FACS General and Minimally Invasive Surgery Breast and Colorectal Surgery  02/21/2014 11:02 AM

## 2014-02-21 NOTE — Interval H&P Note (Signed)
History and Physical Interval Note:  02/21/2014 9:28 AM  Valerie Heath  has presented today for surgery, with the diagnosis of Atypical lobular hyperplasia left breast  The various methods of treatment have been discussed with the patient and family. After consideration of risks, benefits and other options for treatment, the patient has consented to  Procedure(s): BREAST LUMPECTOMY WITH RADIOACTIVE SEED LOCALIZATION (Left) as a surgical intervention .  The patient's history has been reviewed, patient examined, no change in status, stable for surgery.  I have reviewed the patient's chart and labs.  Questions were answered to the patient's satisfaction.     Adin Hector

## 2014-02-23 ENCOUNTER — Telehealth (INDEPENDENT_AMBULATORY_CARE_PROVIDER_SITE_OTHER): Payer: Self-pay | Admitting: General Surgery

## 2014-02-23 ENCOUNTER — Other Ambulatory Visit (INDEPENDENT_AMBULATORY_CARE_PROVIDER_SITE_OTHER): Payer: Self-pay | Admitting: General Surgery

## 2014-02-23 ENCOUNTER — Encounter (HOSPITAL_BASED_OUTPATIENT_CLINIC_OR_DEPARTMENT_OTHER): Payer: Self-pay | Admitting: General Surgery

## 2014-02-23 DIAGNOSIS — C50912 Malignant neoplasm of unspecified site of left female breast: Secondary | ICD-10-CM

## 2014-02-23 NOTE — Telephone Encounter (Signed)
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 The margins are clear. This is a T1 C., NX cancer. I reviewed her mammograms and they are fairly dense.  I called the patient and discussed her report with her. She understands that she will need further evaluation and probably further surgery. I told her she might be a candidate for breast conservation, lumpectomy and sentinel node biopsy. I told her that she might have already had the lumpectomy since the margins are negative.  Because her breasts are dense, she'll be scheduled for bilateral breast MRI. She will return to see me after this is done. By that time we should have the breast diagnostic profile. We can discuss options at that point in time. We will decide whether to refer her to medical oncology or to proceed with surgery directly. \ Rodarius Kichline M. Dalbert Batman, M.D., [facility] General and Minimally invasive Surgery Breast and Colorectal Surgery Office:   [phone] Pager:   [phone]

## 2014-02-23 NOTE — Telephone Encounter (Signed)
Left message on machine requesting the patient call me back to discuss final pathology report.  Daeja Helderman Alyssa Grove 1:15 PM

## 2014-02-28 ENCOUNTER — Ambulatory Visit
Admission: RE | Admit: 2014-02-28 | Discharge: 2014-02-28 | Disposition: A | Discharge status: Home / Self Care | Payer: PRIVATE HEALTH INSURANCE | Source: Ambulatory Visit | Attending: General Surgery | Admitting: General Surgery

## 2014-02-28 DIAGNOSIS — C50912 Malignant neoplasm of unspecified site of left female breast: Secondary | ICD-10-CM

## 2014-02-28 MED ORDER — GADOBENATE DIMEGLUMINE 529 MG/ML IV SOLN
13.0000 mL | Freq: Once | INTRAVENOUS | Status: AC | PRN
  Administered 2014-02-28: 13 mL via INTRAVENOUS

## 2014-03-07 ENCOUNTER — Other Ambulatory Visit (INDEPENDENT_AMBULATORY_CARE_PROVIDER_SITE_OTHER): Payer: Self-pay | Admitting: General Surgery

## 2014-03-07 DIAGNOSIS — R928 Other abnormal and inconclusive findings on diagnostic imaging of breast: Secondary | ICD-10-CM

## 2014-03-09 ENCOUNTER — Encounter (INDEPENDENT_AMBULATORY_CARE_PROVIDER_SITE_OTHER): Payer: Self-pay | Admitting: General Surgery

## 2014-03-09 ENCOUNTER — Ambulatory Visit (INDEPENDENT_AMBULATORY_CARE_PROVIDER_SITE_OTHER): Payer: PRIVATE HEALTH INSURANCE | Admitting: General Surgery

## 2014-03-09 VITALS — BP 134/80 | HR 83 | Temp 98.1°F | Ht 65.0 in | Wt 148.0 lb

## 2014-03-09 DIAGNOSIS — C50412 Malignant neoplasm of upper-outer quadrant of left female breast: Secondary | ICD-10-CM

## 2014-03-09 DIAGNOSIS — C50419 Malignant neoplasm of upper-outer quadrant of unspecified female breast: Secondary | ICD-10-CM

## 2014-03-09 DIAGNOSIS — R928 Other abnormal and inconclusive findings on diagnostic imaging of breast: Secondary | ICD-10-CM

## 2014-03-09 NOTE — Patient Instructions (Signed)
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 This has been completely removed with a negative margin. We gave you a copy of your  pathology report that was highlighted.  The subsequent MRI shows a 10 mm density in the upper-outer quadrant of the right breast, and you are scheduled to have this area biopsied on June 29 after you return from vacation.  We talked about the different ways to take care of the left breast cancer and the need to do lymph node biopsies on the left.  We also talked about genetic testing, especially if you're found to have bilateral breast cancer.  We talked about referral to a medical oncologist and radiation oncologist. You stated she would like to hold off on this until we have all the information back on the right breast.  Return to see Dr. Dalbert Heath after the right breast biopsy is performed in radiology. We will discuss everything it detail at that point.

## 2014-03-09 NOTE — Progress Notes (Signed)
Patient ID: Valerie Heath, female   DOB: 09-Jan-1951, 63 y.o.   MRN: [id number] History: This patient underwent a left breast lumpectomy with radioactive seed localization of 02/21/2014. This was done for atypical lobular hyperplasia. 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 Margins are negative. Receptors positive. HER-2 negative PT 67 8%. She has had no problems with wound healing MRI was performed and shows a 10 mm area of enhancement in the upper outer quadrant of the right breast. She has a vacation coming up and so we could not schedule the MRI guided biopsy until June 29th. She is comfortable with that. We had a long discussion about management of the left breast cancer, surgical options. Implications if she turns out to have a bilateral breast cancer and so forth.  Exam: Patient is in no distress Left breast shows lumpectomy incision in the upper outer quadrant. Healing normally. Slight thickening. No infection. No hematoma. Left axilla negative. Right breast reveals no palpable mass, no skin changes, no axillary adenopathy  Assessment: Invasive ductal carcinoma left breast, upper outer quadrant, receptor positive, HER-2-negative. Negative margins. Recovery uneventfully following left lumpectomy with radioactive seed localization. Abnormal MRI right breast, upper outer quadrant, 10 mm area and cancer. MRI guided biopsy pending, scheduled for June 29  Plan: A long discussion about management of her left breast cancer. We talked about the fact of the lumpectomy has already been adequately done. We talked about mastectomy with or without reconstruction. She will need left sentinel lymph node biopsy and  she understands that. Next step in her care is to proceed with the MRI guided biopsy of the right breast abnormality. We talked about referral to medical oncology and radiation oncology. She wants to hold off on any of that until we have all  the information in. I told her that if she had bilateral breast cancer that consideration would need to be given to genetic testing, and we might need to do that anyway since her mother reportedly had some type of breast cancer and gynecologic cancer. I will see her on July 7.  Edsel Petrin. Dalbert Batman, M.D., [facility] General and Minimally invasive Surgery Breast and Colorectal Surgery Office:   [phone] Pager:   [phone]

## 2014-03-21 ENCOUNTER — Ambulatory Visit
Admission: RE | Admit: 2014-03-21 | Discharge: 2014-03-21 | Disposition: A | Discharge status: Home / Self Care | Payer: PRIVATE HEALTH INSURANCE | Source: Ambulatory Visit | Attending: General Surgery | Admitting: General Surgery

## 2014-03-21 DIAGNOSIS — R928 Other abnormal and inconclusive findings on diagnostic imaging of breast: Secondary | ICD-10-CM

## 2014-03-21 MED ORDER — GADOBENATE DIMEGLUMINE 529 MG/ML IV SOLN
13.0000 mL | Freq: Once | INTRAVENOUS | Status: AC | PRN
  Administered 2014-03-21: 13 mL via INTRAVENOUS

## 2014-03-23 ENCOUNTER — Telehealth (INDEPENDENT_AMBULATORY_CARE_PROVIDER_SITE_OTHER): Payer: Self-pay | Admitting: General Surgery

## 2014-03-23 NOTE — Telephone Encounter (Signed)
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  Mother had breast cancer. She has no siblings and no children. I discussed this with her in detail.  Offered Genetic counseling/testing Offered med-onc referral Discussed surgical options. She wanted to wait until we discuss in office next week before any decisions are made. She will bring her husband.   Edsel Petrin. Dalbert Batman, M.D., [facility] General and Minimally invasive Surgery Breast and Colorectal Surgery Office:   [phone] Pager:   [phone]

## 2014-03-23 NOTE — Telephone Encounter (Signed)
Called....LMOM to call back   Valerie Heath. Dalbert Batman, M.D., [facility]

## 2014-03-29 ENCOUNTER — Encounter (INDEPENDENT_AMBULATORY_CARE_PROVIDER_SITE_OTHER): Payer: Self-pay | Admitting: General Surgery

## 2014-03-29 ENCOUNTER — Ambulatory Visit (INDEPENDENT_AMBULATORY_CARE_PROVIDER_SITE_OTHER): Payer: PRIVATE HEALTH INSURANCE | Admitting: General Surgery

## 2014-03-29 VITALS — BP 110/65 | HR 71 | Temp 96.7°F | Resp 14 | Ht 65.0 in | Wt 149.8 lb

## 2014-03-29 DIAGNOSIS — C50412 Malignant neoplasm of upper-outer quadrant of left female breast: Secondary | ICD-10-CM | POA: Insufficient documentation

## 2014-03-29 DIAGNOSIS — C50411 Malignant neoplasm of upper-outer quadrant of right female breast: Secondary | ICD-10-CM | POA: Insufficient documentation

## 2014-03-29 DIAGNOSIS — C50419 Malignant neoplasm of upper-outer quadrant of unspecified female breast: Secondary | ICD-10-CM

## 2014-03-29 NOTE — Patient Instructions (Signed)
Your right breast image guided biopsy shows an early form of breast cancer: Ductal carcinoma in situ. 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  We have discussed all of your surgical options including right-sided lumpectomy and bilateral sentinel node biopsy. We've discussed the role of radiation therapy in that setting.  We discussed the option of bilateral mastectomy with bilateral sentinal node  biopsy, with or without plastic surgical reconstruction.  We have discussed referral to a medical oncologist for preoperative consultation, and you are aware that you will need to see one postop  We have also discussed genetic counseling and genetic testing since you have  have bilateral breast cancer and your mother had breast cancer. You are aware that a genetic mutation could increase your risk of recurrence in the future.  You have decided to proceed with right breast lumpectomy with radioactive seed localization, and bilateral sentinel node biopsy. You state that you would like to be referred to a medical oncologist postop and that you are considering genetic counseling.  We will schedule the planned surgery, and plan to refer you to a medical oncologist and radiation oncologist postop. We will discuss genetic testing again at a later date.

## 2014-03-29 NOTE — Progress Notes (Signed)
Patient ID: Valerie Heath, female   DOB: 11/15/1950, 63 y.o.   MRN: [id number]  History:  This patient underwent a left breast lumpectomy with radioactive seed localization of 02/21/2014. This was done for atypical lobular hyperplasia. 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 Margins are negative. Receptors positive. HER-2 negative PT 67 8%.  She has had no problems with wound healing  MRI was performed and shows a 10 mm area of enhancement in the upper outer quadrant of the right breast.  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 She developed a noticeable hematoma in the right breast.   Exam:  Patient is in no distress  Neck reveals no adenopathy or mass Lungs are clear to auscultation bilaterally Heart regular rate and rhythm without murmur or ectopy. Left breast shows lumpectomy incision in the upper outer quadrant. Healing normally. Slight thickening. No infection. No hematoma. Left axilla negative.  Right breast shows significant ecchymoses and a golf ball size hematoma centrally. No other skin changes or axillary adenopathy.  Assessment:  Invasive ductal carcinoma left breast, upper outer quadrant, receptor positive, HER-2-negative. Negative margins. Recovery uneventfully following left lumpectomy with radioactive seed localization.  Receptor-positive DCIS right breast, upper outer quadrant, status post image guided biopsy Family history breast cancer in mother.   Plan:  I had a long conversation with the patient and her husband. We went over the anatomy of her bilateral breast cancers. Essentially these are relatively small, solitary cancers, both in the upper outer quadrant. We talked about bilateral SLN biopsy. We talked about right lumpectomy with radioactive seed. We talked about radiation therapy being essential following lumpectomy. We talked about bilateral  mastectomy with or without reconstruction. I told her she would probably be offered antiestrogen therapy.   We talked about genetic counseling & testing as being appropriate given her risk factors. Discussed the implications of having a positive genetic testing in terms of recurrence. She has no siblings or children. Offered to refer to medical oncology radiation oncology preop for consultation. She declined.   She is strongly motivated towards breast conservation. She wants to go ahead with right lumpectomy with radioactive seed localization, bilateral sentinel lymph node  biopsy. She wants to be referred to medical oncology, radiation oncology, and probably genetic counseling postop. She is very willing to have radiation therapy and understands she will likely be offered antiestrogen therapy.  I discussed the indications, details, techniques, and numerous risk of the surgery with her. She is aware of the risk of arm swelling, arm numbness, arm pain, bleeding, infection, cosmetic deformity, skin necrosis, and other unforeseen problems. She understands these issues well. All of her questions are answered. She agrees with this plan.  We will try to wait about 3 weeks to let the right-sided hematoma subside.    Haywood M. Ingram, M.D., FACS  [facility]  General and Minimally invasive Surgery  Breast and Colorectal Surgery  Office: [phone]  Pager: [phone]   

## 2014-04-04 ENCOUNTER — Other Ambulatory Visit (INDEPENDENT_AMBULATORY_CARE_PROVIDER_SITE_OTHER): Payer: Self-pay | Admitting: General Surgery

## 2014-04-04 DIAGNOSIS — C50411 Malignant neoplasm of upper-outer quadrant of right female breast: Secondary | ICD-10-CM

## 2014-04-20 ENCOUNTER — Encounter (HOSPITAL_BASED_OUTPATIENT_CLINIC_OR_DEPARTMENT_OTHER): Payer: Self-pay | Admitting: *Deleted

## 2014-04-20 NOTE — Progress Notes (Signed)
Pt here 6/15 for lt lumpectomy-coming back for rt lump-bilat snbx No labs needed

## 2014-04-23 HISTORY — PX: BREAST SURGERY: SHX581

## 2014-04-26 ENCOUNTER — Ambulatory Visit
Admission: RE | Admit: 2014-04-26 | Discharge: 2014-04-26 | Disposition: A | Discharge status: Home / Self Care | Payer: No Typology Code available for payment source | Source: Ambulatory Visit | Attending: General Surgery | Admitting: General Surgery

## 2014-04-26 DIAGNOSIS — C50411 Malignant neoplasm of upper-outer quadrant of right female breast: Secondary | ICD-10-CM

## 2014-04-26 NOTE — H&P (Signed)
   History:  This patient underwent a left breast lumpectomy with radioactive seed localization of 02/21/2014. This was done for atypical lobular hyperplasia. 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 Margins are negative. Receptors positive. HER-2 negative PT 67 8%.  She has had no problems with wound healing  MRI was performed and shows a 10 mm area of enhancement in the upper outer quadrant of the right breast.  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 She developed a noticeable hematoma in the right breast.   Exam:  Patient is in no distress  Neck reveals no adenopathy or mass  Lungs are clear to auscultation bilaterally  Heart regular rate and rhythm without murmur or ectopy.  Left breast shows lumpectomy incision in the upper outer quadrant. Healing normally. Slight thickening. No infection. No hematoma. Left axilla negative.  Right breast shows significant ecchymoses and a golf ball size hematoma centrally. No other skin changes or axillary adenopathy.   Assessment:  Invasive ductal carcinoma left breast, upper outer quadrant, receptor positive, HER-2-negative. Negative margins. Recovery uneventfully following left lumpectomy with radioactive seed localization.  Receptor-positive DCIS right breast, upper outer quadrant, status post image guided biopsy  Family history breast cancer in mother.   Plan:  I had a long conversation with the patient and her husband. We went over the anatomy of her bilateral breast cancers. Essentially these are relatively small, solitary cancers, both in the upper outer quadrant. We talked about bilateral SLN biopsy. We talked about right lumpectomy with radioactive seed. We talked about radiation therapy being essential following lumpectomy. We talked about bilateral mastectomy with or without reconstruction. I told her she would  probably be offered antiestrogen therapy. We talked about genetic counseling & testing as being appropriate given her risk factors. Discussed the implications of having a positive genetic testing in terms of recurrence. She has no siblings or children. Offered to refer to medical oncology radiation oncology preop for consultation. She declined.  She is strongly motivated towards breast conservation. She wants to go ahead with right lumpectomy with radioactive seed localization, bilateral sentinel lymph node biopsy. She wants to be referred to medical oncology, radiation oncology, and probably genetic counseling postop. She is very willing to have radiation therapy and understands she will likely be offered antiestrogen therapy.  I discussed the indications, details, techniques, and numerous risk of the surgery with her. She is aware of the risk of arm swelling, arm numbness, arm pain, bleeding, infection, cosmetic deformity, skin necrosis, and other unforeseen problems. She understands these issues well. All of her questions are answered. She agrees with this plan.  We will try to wait about 3 weeks to let the right-sided hematoma subside.     Edsel Petrin. Dalbert Batman, M.D., [facility]  General and Minimally invasive Surgery  Breast and Colorectal Surgery  Office: [phone]  Pager: [phone]

## 2014-04-27 ENCOUNTER — Ambulatory Visit (HOSPITAL_BASED_OUTPATIENT_CLINIC_OR_DEPARTMENT_OTHER): Payer: 59 | Admitting: Anesthesiology

## 2014-04-27 ENCOUNTER — Ambulatory Visit
Admission: RE | Admit: 2014-04-27 | Discharge: 2014-04-27 | Disposition: A | Discharge status: Home / Self Care | Payer: No Typology Code available for payment source | Source: Ambulatory Visit | Attending: General Surgery | Admitting: General Surgery

## 2014-04-27 ENCOUNTER — Encounter (HOSPITAL_BASED_OUTPATIENT_CLINIC_OR_DEPARTMENT_OTHER): Payer: 59 | Admitting: Anesthesiology

## 2014-04-27 ENCOUNTER — Telehealth (INDEPENDENT_AMBULATORY_CARE_PROVIDER_SITE_OTHER): Payer: Self-pay

## 2014-04-27 ENCOUNTER — Ambulatory Visit (HOSPITAL_BASED_OUTPATIENT_CLINIC_OR_DEPARTMENT_OTHER)
Admission: RE | Admit: 2014-04-27 | Discharge: 2014-04-27 | Disposition: A | Discharge status: Home / Self Care | Payer: 59 | Source: Ambulatory Visit | Attending: General Surgery | Admitting: General Surgery

## 2014-04-27 ENCOUNTER — Encounter (HOSPITAL_BASED_OUTPATIENT_CLINIC_OR_DEPARTMENT_OTHER): Payer: Self-pay | Admitting: *Deleted

## 2014-04-27 ENCOUNTER — Encounter (HOSPITAL_BASED_OUTPATIENT_CLINIC_OR_DEPARTMENT_OTHER)
Admission: RE | Disposition: A | Discharge status: Home / Self Care | Payer: Self-pay | Source: Ambulatory Visit | Attending: General Surgery

## 2014-04-27 ENCOUNTER — Encounter (HOSPITAL_COMMUNITY)
Admission: RE | Admit: 2014-04-27 | Discharge: 2014-04-27 | Disposition: A | Discharge status: Home / Self Care | Payer: No Typology Code available for payment source | Source: Ambulatory Visit | Attending: General Surgery | Admitting: General Surgery

## 2014-04-27 DIAGNOSIS — C50419 Malignant neoplasm of upper-outer quadrant of unspecified female breast: Secondary | ICD-10-CM | POA: Insufficient documentation

## 2014-04-27 DIAGNOSIS — Z803 Family history of malignant neoplasm of breast: Secondary | ICD-10-CM | POA: Diagnosis not present

## 2014-04-27 DIAGNOSIS — N6019 Diffuse cystic mastopathy of unspecified breast: Secondary | ICD-10-CM | POA: Diagnosis not present

## 2014-04-27 DIAGNOSIS — D0590 Unspecified type of carcinoma in situ of unspecified breast: Secondary | ICD-10-CM

## 2014-04-27 DIAGNOSIS — N6029 Fibroadenosis of unspecified breast: Secondary | ICD-10-CM | POA: Insufficient documentation

## 2014-04-27 DIAGNOSIS — R920 Mammographic microcalcification found on diagnostic imaging of breast: Secondary | ICD-10-CM

## 2014-04-27 DIAGNOSIS — C50411 Malignant neoplasm of upper-outer quadrant of right female breast: Secondary | ICD-10-CM

## 2014-04-27 DIAGNOSIS — C50412 Malignant neoplasm of upper-outer quadrant of left female breast: Secondary | ICD-10-CM

## 2014-04-27 HISTORY — PX: LYMPH NODE DISSECTION: SHX5087

## 2014-04-27 HISTORY — DX: Malignant (primary) neoplasm, unspecified: C80.1

## 2014-04-27 LAB — POCT HEMOGLOBIN-HEMACUE: Hemoglobin: 14.3 g/dL (ref 12.0–15.0)

## 2014-04-27 SURGERY — RADIOACTIVE SEED GUIDED PARTIAL MASTECTOMY WITH AXILLARY SENTINEL LYMPH NODE BIOPSY
Anesthesia: General | Site: Breast | Laterality: Right

## 2014-04-27 MED ORDER — SODIUM CHLORIDE 0.9 % IJ SOLN
INTRAMUSCULAR | Status: AC
  Filled 2014-04-27: qty 10

## 2014-04-27 MED ORDER — BUPIVACAINE-EPINEPHRINE (PF) 0.5% -1:200000 IJ SOLN
INTRAMUSCULAR | Status: AC
  Filled 2014-04-27: qty 30

## 2014-04-27 MED ORDER — MIDAZOLAM HCL 2 MG/2ML IJ SOLN
INTRAMUSCULAR | Status: AC
  Filled 2014-04-27: qty 2

## 2014-04-27 MED ORDER — HYDROCODONE-ACETAMINOPHEN 5-325 MG PO TABS: 1.0000 | ORAL_TABLET | Freq: Four times a day (QID) | ORAL | Status: DC | PRN

## 2014-04-27 MED ORDER — LACTATED RINGERS IV SOLN
INTRAVENOUS | Status: DC
  Administered 2014-04-27 (×3): via INTRAVENOUS

## 2014-04-27 MED ORDER — FENTANYL CITRATE 0.05 MG/ML IJ SOLN
INTRAMUSCULAR | Status: AC
  Filled 2014-04-27: qty 2

## 2014-04-27 MED ORDER — CEFAZOLIN SODIUM-DEXTROSE 2-3 GM-% IV SOLR
INTRAVENOUS | Status: AC
  Filled 2014-04-27: qty 50

## 2014-04-27 MED ORDER — SODIUM CHLORIDE 0.9 % IJ SOLN
INTRAMUSCULAR | Status: DC | PRN
  Administered 2014-04-27: 11:00:00 via INTRAMUSCULAR

## 2014-04-27 MED ORDER — PROPOFOL 10 MG/ML IV BOLUS
INTRAVENOUS | Status: DC | PRN
  Administered 2014-04-27: 30 mg via INTRAVENOUS
  Administered 2014-04-27: 200 mg via INTRAVENOUS

## 2014-04-27 MED ORDER — FENTANYL CITRATE 0.05 MG/ML IJ SOLN
50.0000 ug | INTRAMUSCULAR | Status: DC | PRN
  Administered 2014-04-27: 50 ug via INTRAVENOUS
  Administered 2014-04-27: 100 ug via INTRAVENOUS

## 2014-04-27 MED ORDER — FENTANYL CITRATE 0.05 MG/ML IJ SOLN
INTRAMUSCULAR | Status: AC
  Filled 2014-04-27: qty 4

## 2014-04-27 MED ORDER — METHYLENE BLUE 1 % INJ SOLN
INTRAMUSCULAR | Status: AC
  Filled 2014-04-27: qty 10

## 2014-04-27 MED ORDER — ONDANSETRON HCL 4 MG/2ML IJ SOLN: 4.0000 mg | Freq: Four times a day (QID) | INTRAMUSCULAR | Status: DC | PRN

## 2014-04-27 MED ORDER — OXYCODONE HCL 5 MG/5ML PO SOLN: 5.0000 mg | Freq: Once | ORAL | Status: DC | PRN

## 2014-04-27 MED ORDER — LIDOCAINE HCL (CARDIAC) 20 MG/ML IV SOLN
INTRAVENOUS | Status: DC | PRN
  Administered 2014-04-27: 25 mg via INTRAVENOUS

## 2014-04-27 MED ORDER — FENTANYL CITRATE 0.05 MG/ML IJ SOLN
25.0000 ug | INTRAMUSCULAR | Status: DC | PRN
  Administered 2014-04-27: 50 ug via INTRAVENOUS
  Administered 2014-04-27 (×2): 25 ug via INTRAVENOUS
  Administered 2014-04-27: 50 ug via INTRAVENOUS

## 2014-04-27 MED ORDER — CHLORHEXIDINE GLUCONATE 4 % EX LIQD: 1.0000 "application " | Freq: Once | CUTANEOUS | Status: DC

## 2014-04-27 MED ORDER — DEXAMETHASONE SODIUM PHOSPHATE 4 MG/ML IJ SOLN
INTRAMUSCULAR | Status: DC | PRN
  Administered 2014-04-27: 10 mg via INTRAVENOUS

## 2014-04-27 MED ORDER — BUPIVACAINE-EPINEPHRINE (PF) 0.5% -1:200000 IJ SOLN
INTRAMUSCULAR | Status: DC | PRN
  Administered 2014-04-27 (×2): 15 mL via PERINEURAL

## 2014-04-27 MED ORDER — MIDAZOLAM HCL 2 MG/2ML IJ SOLN
1.0000 mg | INTRAMUSCULAR | Status: DC | PRN
  Administered 2014-04-27: 1 mg via INTRAVENOUS
  Administered 2014-04-27: 2 mg via INTRAVENOUS

## 2014-04-27 MED ORDER — TECHNETIUM TC 99M SULFUR COLLOID FILTERED
1.0000 | Freq: Once | INTRAVENOUS | Status: AC | PRN
  Administered 2014-04-27: 1 via INTRADERMAL

## 2014-04-27 MED ORDER — BUPIVACAINE-EPINEPHRINE (PF) 0.5% -1:200000 IJ SOLN
INTRAMUSCULAR | Status: DC | PRN
  Administered 2014-04-27: 10 mL

## 2014-04-27 MED ORDER — PROPOFOL 10 MG/ML IV EMUL
INTRAVENOUS | Status: AC
  Filled 2014-04-27: qty 50

## 2014-04-27 MED ORDER — OXYCODONE HCL 5 MG PO TABS: 5.0000 mg | ORAL_TABLET | Freq: Once | ORAL | Status: DC | PRN

## 2014-04-27 MED ORDER — CEFAZOLIN SODIUM-DEXTROSE 2-3 GM-% IV SOLR
2.0000 g | INTRAVENOUS | Status: AC
  Administered 2014-04-27: 2 g via INTRAVENOUS

## 2014-04-27 SURGICAL SUPPLY — 65 items
ADH SKN CLS APL DERMABOND .7 (GAUZE/BANDAGES/DRESSINGS) ×2
APL SKNCLS STERI-STRIP NONHPOA (GAUZE/BANDAGES/DRESSINGS)
APPLIER CLIP 9.375 MED OPEN (MISCELLANEOUS) ×2
APR CLP MED 9.3 20 MLT OPN (MISCELLANEOUS) ×1
BENZOIN TINCTURE PRP APPL 2/3 (GAUZE/BANDAGES/DRESSINGS) IMPLANT
BINDER BREAST LRG (GAUZE/BANDAGES/DRESSINGS) IMPLANT
BINDER BREAST MEDIUM (GAUZE/BANDAGES/DRESSINGS) IMPLANT
BINDER BREAST XLRG (GAUZE/BANDAGES/DRESSINGS) IMPLANT
BINDER BREAST XXLRG (GAUZE/BANDAGES/DRESSINGS) IMPLANT
BLADE HEX COATED 2.75 (ELECTRODE) ×2 IMPLANT
BLADE SURG 10 STRL SS (BLADE) IMPLANT
BLADE SURG 15 STRL LF DISP TIS (BLADE) ×1 IMPLANT
BLADE SURG 15 STRL SS (BLADE) ×2
CANISTER SUC SOCK COL 7IN (MISCELLANEOUS) ×1 IMPLANT
CANISTER SUCT 1200ML W/VALVE (MISCELLANEOUS) ×2 IMPLANT
CHLORAPREP W/TINT 26ML (MISCELLANEOUS) ×3 IMPLANT
CLIP APPLIE 9.375 MED OPEN (MISCELLANEOUS) ×1 IMPLANT
COVER MAYO STAND STRL (DRAPES) ×2 IMPLANT
COVER PROBE W GEL 5X96 (DRAPES) ×2 IMPLANT
COVER TABLE BACK 60X90 (DRAPES) ×2 IMPLANT
DECANTER SPIKE VIAL GLASS SM (MISCELLANEOUS) IMPLANT
DERMABOND ADVANCED (GAUZE/BANDAGES/DRESSINGS) ×2
DERMABOND ADVANCED .7 DNX12 (GAUZE/BANDAGES/DRESSINGS) ×1 IMPLANT
DEVICE DUBIN W/COMP PLATE 8390 (MISCELLANEOUS) ×2 IMPLANT
DRAPE LAPAROSCOPIC ABDOMINAL (DRAPES) ×1 IMPLANT
DRAPE U-SHAPE 76X120 STRL (DRAPES) ×4 IMPLANT
DRAPE UTILITY XL STRL (DRAPES) ×2 IMPLANT
DRSG PAD ABDOMINAL 8X10 ST (GAUZE/BANDAGES/DRESSINGS) IMPLANT
ELECT REM PT RETURN 9FT ADLT (ELECTROSURGICAL) ×2
ELECTRODE REM PT RTRN 9FT ADLT (ELECTROSURGICAL) ×1 IMPLANT
GLOVE BIOGEL PI IND STRL 7.0 (GLOVE) IMPLANT
GLOVE BIOGEL PI INDICATOR 7.0 (GLOVE) ×1
GLOVE ECLIPSE 6.5 STRL STRAW (GLOVE) ×2 IMPLANT
GLOVE EUDERMIC 7 POWDERFREE (GLOVE) ×4 IMPLANT
GOWN STRL REUS W/ TWL LRG LVL3 (GOWN DISPOSABLE) ×2 IMPLANT
GOWN STRL REUS W/ TWL XL LVL3 (GOWN DISPOSABLE) ×1 IMPLANT
GOWN STRL REUS W/TWL LRG LVL3 (GOWN DISPOSABLE) ×2
GOWN STRL REUS W/TWL XL LVL3 (GOWN DISPOSABLE) ×2
KIT MARKER MARGIN INK (KITS) ×2 IMPLANT
NDL HYPO 25X1 1.5 SAFETY (NEEDLE) ×2 IMPLANT
NDL SAFETY ECLIPSE 18X1.5 (NEEDLE) ×1 IMPLANT
NEEDLE HYPO 18GX1.5 SHARP (NEEDLE) ×2
NEEDLE HYPO 25X1 1.5 SAFETY (NEEDLE) ×4 IMPLANT
NS IRRIG 1000ML POUR BTL (IV SOLUTION) ×2 IMPLANT
PACK BASIN DAY SURGERY FS (CUSTOM PROCEDURE TRAY) ×2 IMPLANT
PENCIL BUTTON HOLSTER BLD 10FT (ELECTRODE) ×2 IMPLANT
SHEET MEDIUM DRAPE 40X70 STRL (DRAPES) ×2 IMPLANT
SLEEVE SCD COMPRESS KNEE MED (MISCELLANEOUS) ×2 IMPLANT
SPONGE GAUZE 4X4 12PLY STER LF (GAUZE/BANDAGES/DRESSINGS) IMPLANT
SPONGE LAP 18X18 X RAY DECT (DISPOSABLE) IMPLANT
SPONGE LAP 4X18 X RAY DECT (DISPOSABLE) ×4 IMPLANT
STRIP CLOSURE SKIN 1/2X4 (GAUZE/BANDAGES/DRESSINGS) IMPLANT
SUT ETHILON 3 0 FSL (SUTURE) IMPLANT
SUT MNCRL AB 4-0 PS2 18 (SUTURE) ×3 IMPLANT
SUT SILK 2 0 SH (SUTURE) ×2 IMPLANT
SUT VIC AB 2-0 CT1 27 (SUTURE)
SUT VIC AB 2-0 CT1 TAPERPNT 27 (SUTURE) IMPLANT
SUT VIC AB 3-0 SH 27 (SUTURE)
SUT VIC AB 3-0 SH 27X BRD (SUTURE) IMPLANT
SUT VICRYL 3-0 CR8 SH (SUTURE) ×3 IMPLANT
SYRINGE 10CC LL (SYRINGE) ×4 IMPLANT
TOWEL OR 17X24 6PK STRL BLUE (TOWEL DISPOSABLE) ×2 IMPLANT
TOWEL OR NON WOVEN STRL DISP B (DISPOSABLE) ×1 IMPLANT
TUBE CONNECTING 20X1/4 (TUBING) ×2 IMPLANT
YANKAUER SUCT BULB TIP NO VENT (SUCTIONS) ×2 IMPLANT

## 2014-04-27 NOTE — Telephone Encounter (Signed)
Pt called to see what time she needs to be at [facility] for her surgery. After speaking with Colletta Maryland in surgery scheduling I let pt know to arrive at 9:30.

## 2014-04-27 NOTE — Transfer of Care (Signed)
Immediate Anesthesia Transfer of Care Note  Patient: Valerie Heath  Procedure(s) Performed: Procedure(s): RIGHT BREAST SEED GUIDED LUMPECTOMY WITH BILATERAL AXILLARY SENTINEL LYMPH NODE BIOPSY (Right)  Patient Location: PACU  Anesthesia Type:General  Level of Consciousness: awake  Airway & Oxygen Therapy: Patient Spontanous Breathing and Patient connected to face mask oxygen  Post-op Assessment: Report given to PACU RN and Post -op Vital signs reviewed and stable  Post vital signs: Reviewed and stable  Complications: No apparent anesthesia complications

## 2014-04-27 NOTE — Anesthesia Preprocedure Evaluation (Signed)
Anesthesia Evaluation  Patient identified by MRN, date of birth, ID band Patient awake    Reviewed: Allergy & Precautions, H&P , NPO status , Patient's Chart, lab work & pertinent test results  Airway Mallampati: II  Neck ROM: full    Dental   Pulmonary neg pulmonary ROS,          Cardiovascular negative cardio ROS      Neuro/Psych    GI/Hepatic   Endo/Other  Hypothyroidism   Renal/GU      Musculoskeletal   Abdominal   Peds  Hematology   Anesthesia Other Findings   Reproductive/Obstetrics Breast CA                           Anesthesia Physical Anesthesia Plan  ASA: II  Anesthesia Plan: General   Post-op Pain Management:    Induction: Intravenous  Airway Management Planned: LMA  Additional Equipment:   Intra-op Plan:   Post-operative Plan:   Informed Consent: I have reviewed the patients History and Physical, chart, labs and discussed the procedure including the risks, benefits and alternatives for the proposed anesthesia with the patient or authorized representative who has indicated his/her understanding and acceptance.     Plan Discussed with: CRNA, Anesthesiologist and Surgeon  Anesthesia Plan Comments:         Anesthesia Quick Evaluation

## 2014-04-27 NOTE — Anesthesia Postprocedure Evaluation (Signed)
  Anesthesia Post-op Note  Patient: Valerie Heath  Procedure(s) Performed: Procedure(s): RIGHT BREAST SEED GUIDED LUMPECTOMY WITH BILATERAL AXILLARY SENTINEL LYMPH NODE BIOPSY (Right)  Patient Location: PACU  Anesthesia Type:General and block  Level of Consciousness: awake and alert   Airway and Oxygen Therapy: Patient Spontanous Breathing  Post-op Pain: mild  Post-op Assessment: Post-op Vital signs reviewed, Patient's Cardiovascular Status Stable and Respiratory Function Stable  Post-op Vital Signs: Reviewed  Filed Vitals:   04/27/14 1406  BP:   Pulse: 74  Temp:   Resp: 16    Complications: No apparent anesthesia complications

## 2014-04-27 NOTE — Progress Notes (Signed)
Assisted Dr. Hodierne with right, left, ultrasound guided, pectoralis block. Side rails up, monitors on throughout procedure. See vital signs in flow sheet. Tolerated Procedure well. °

## 2014-04-27 NOTE — Anesthesia Procedure Notes (Addendum)
Anesthesia Regional Block:  Pectoralis block  Pre-Anesthetic Checklist: ,, timeout performed, Correct Patient, Correct Site, Correct Laterality, Correct Procedure, Correct Position, site marked, Risks and benefits discussed,  Surgical consent,  Pre-op evaluation,  At surgeon's request and post-op pain management  Laterality: Left  Prep: chloraprep       Needles:  Injection technique: Single-shot  Needle Type: Echogenic Needle     Needle Length: 9cm 9 cm Needle Gauge: 21 and 21 G    Additional Needles:  Procedures: ultrasound guided (picture in chart) Pectoralis block Narrative:  Start time: 04/27/2014 10:33 AM End time: 04/27/2014 10:41 AM Injection made incrementally with aspirations every 5 mL.  Performed by: Personally  Anesthesiologist: Dr Marcie Bal   Anesthesia Regional Block:  Pectoralis block  Pre-Anesthetic Checklist: ,, timeout performed, Correct Patient, Correct Site, Correct Laterality, Correct Procedure, Correct Position, site marked, Risks and benefits discussed,  Surgical consent,  Pre-op evaluation,  At surgeon's request and post-op pain management  Laterality: Right  Prep: chloraprep       Needles:  Injection technique: Single-shot  Needle Type: Echogenic Needle     Needle Length: 9cm 9 cm Needle Gauge: 21 and 21 G    Additional Needles:  Procedures: ultrasound guided (picture in chart) Pectoralis block Narrative:  Start time: 04/27/2014 10:43 AM End time: 04/27/2014 10:51 AM Injection made incrementally with aspirations every 5 mL.  Performed by: Personally  Anesthesiologist: Dr Marcie Bal   Procedure Name: LMA Insertion Date/Time: 04/27/2014 11:14 AM Performed by: Lyndee Leo Pre-anesthesia Checklist: Patient identified, Emergency Drugs available, Suction available and Patient being monitored Patient Re-evaluated:Patient Re-evaluated prior to inductionOxygen Delivery Method: Circle System Utilized Preoxygenation: Pre-oxygenation with 100%  oxygen Intubation Type: IV induction Ventilation: Mask ventilation without difficulty LMA: LMA inserted LMA Size: 4.0 Number of attempts: 1 Airway Equipment and Method: bite block Placement Confirmation: positive ETCO2 Tube secured with: Tape Dental Injury: Teeth and Oropharynx as per pre-operative assessment

## 2014-04-27 NOTE — Interval H&P Note (Signed)
History and Physical Interval Note:  04/27/2014 10:52 AM  Valerie Heath  has presented today for surgery, with the diagnosis of bilateral breast cancer  The goals and the various methods of treatment have been discussed with the patient and family. After consideration of risks, benefits and other options for treatment, the patient has consented to  Procedure(s): RIGHT BREAST SEED GUIDED LUMPECTOMY WITH BILATERAL AXILLARY SENTINEL LYMPH NODE BIOPSY (N/A) as a surgical intervention .  The patient's history has been reviewed, patient examined today, no change in status, stable for surgery.  I have reviewed the patient's chart and labs.  Questions were answered to the patient's satisfaction.     Adin Hector

## 2014-04-27 NOTE — Op Note (Signed)
Patient Name:           Valerie Heath   Date of Surgery:        04/27/2014  Note: This dictation was prepared with Dragon/digital dictation along with [facility] technology. Any transcriptional errors that result from this process are unintentional.    Pre op Diagnosis:      1)   Invasive ductal carcinoma left breast, upper outer quadrant, receptor positive, HER-2-negative. Negative margins. Status post recent left lumpectomy with radioactive seed localization.  2)   Receptor-positive DCIS right breast, Lateral retroareolar area,, status post image guided biopsy  3)   Family history breast cancer in mother   Post op Diagnosis:    same  Procedure:  Inject blue dye right breast Inject blue dye  left breast Right partial mastectomy with radioactive seed localization and margin assessment Right axillary sentinel node biopsy Left axillary sentinel node biopsy  Surgeon:                     Edsel Petrin. Dalbert Batman, M.D., FACS  Assistant:                      none  Operative Indications:   This patient recently underwent a left breast lumpectomy with radioactive seed localization on 02/21/2014. This was done for atypical lobular hyperplasia. 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 Margins are negative. Receptors positive. HER-2 negative Ki-67 8%.  MRI was performed and shows a 10 mm area of enhancement in the upper outer quadrant of the right breast.  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 She developed a noticeable hematoma in the right breast.  She was counseled regarding her options, referral to medical oncology and radiation oncology and genetic counseling. She is strongly motivated for breast conservation and wanted to defer those consultations until she was postop. She is brought to the operating room for right partial mastectomy and bilateral sentinel node  biopsy.   Operative Findings:       The marker clip and the radioactive seed in the right breast were in the retroareolar area laterally. There was a resolving hematoma in this area which made the tissue somewhat fibrotic. The marker clip and the single radioactive seed were found within the specimen on the specimen mammogram. I was able to map and remove 2 sentinel nodes on the right side as well as 2 sentinel nodes on the left side.  Procedure in Detail:          The patient underwent radioactive seed placement into the right breast cancer yesterday, and it was positioned as described above. I identified the I-125 radioactivity in the right breast with the neoprobe preop. In the holding area, the nuclear medicine technician injected technetium-99  radionuclide into both breasts. On the left side I had them inject lateral to the upper outer quadrant incision. The patient was brought to the operating room. Gen. Anesthesia with LMA device was induced. Intravenous antibiotics were given. Surgical time out was performed.     Following alcohol prep I injected 5 cc of blue dye in the right breast retroareolar area and another 5 cc of blue dye into the left breast upper outer quadrant. The breasts were massaged for a few minutes. The breast and axilla and chest wall were then prepped and draped in a sterile fashion.     Using the  neoprobe I identified the area of maximal radioactivity in the right breast retroareolar area laterally. I made a circumareolar incision just outside the lateral areolar margin. I dissected down into the breast tissue and dissected widely around the radioactivity using the neoprobe frequently. The specimen was removed. The radioactivity was contained within the specimen. The specimen was marked with silk sutures and a 6 color ink kit to orient the pathologist. The specimen was marked according to the radiologist's directions and sent to the lab. Hemostasis was excellent and achieved with  electrocautery. Right breast lumpectomy cavity is marked with metal clips. The lumpectomy cavity was closed with several layers of interrupted 3-0 Vicryl and skin closed with a running subcuticular suture of 4-0 Monocryl and Dermabond.    I then made a transverse incision at the right axilla at the hairline. Dissection was carried down into the axillary space. Using the neoprobe I found 2 very hot, very blue lymph nodes and removed those. There was no more radioactivity in the axilla. The wound was irrigated. Hemostasis excellent. Deeper tissues were closed with interrupted 3-0 Vicryl sutures and skin closed with running subcuticular suture of 4-0 Monocryl and Dermabond.    I then made a transverse incision in the left axilla at the hairline. I dissected down into the axillary space and found 2 sentinel lymph nodes both hot and blue and both were sent to the lab. There was no more radioactivity. Hemostasis was excellent. The wound was irrigated. The left axilla tissues were closed with 3-0 Vicryl sutures and the skin closed with running subcuticular 4-0 Monocryl and Dermabond. The patient tolerated the procedure well and was  taken to PACU in stable condition. EBL 25 cc. Counts correct. Complications none.     Edsel Petrin. Dalbert Batman, M.D., FACS General and Minimally Invasive Surgery Breast and Colorectal Surgery  04/27/2014 12:47 PM

## 2014-04-27 NOTE — Discharge Instructions (Signed)
[facility] °Office Phone Number [phone] ° °BREAST BIOPSY/ PARTIAL MASTECTOMY: POST OP INSTRUCTIONS ° °Always review your discharge instruction sheet given to you by the facility where your surgery was performed. ° °IF YOU HAVE DISABILITY OR FAMILY LEAVE FORMS, YOU MUST BRING THEM TO THE OFFICE FOR PROCESSING.  DO NOT GIVE THEM TO YOUR DOCTOR. ° °1. A prescription for pain medication may be given to you upon discharge.  Take your pain medication as prescribed, if needed.  If narcotic pain medicine is not needed, then you may take acetaminophen (Tylenol) or ibuprofen (Advil) as needed. °2. Take your usually prescribed medications unless otherwise directed °3. If you need a refill on your pain medication, please contact your pharmacy.  They will contact our office to request authorization.  Prescriptions will not be filled after 5pm or on week-ends. °4. You should eat very light the first 24 hours after surgery, such as soup, crackers, pudding, etc.  Resume your normal diet the day after surgery. °5. Most patients will experience some swelling and bruising in the breast.  Ice packs and a good support bra will help.  Swelling and bruising can take several days to resolve.  °6. It is common to experience some constipation if taking pain medication after surgery.  Increasing fluid intake and taking a stool softener will usually help or prevent this problem from occurring.  A mild laxative (Milk of Magnesia or Miralax) should be taken according to package directions if there are no bowel movements after 48 hours. °7. Unless discharge instructions indicate otherwise, you may remove your bandages 24-48 hours after surgery, and you may shower at that time.  You may have steri-strips (small skin tapes) in place directly over the incision.  These strips should be left on the skin for 7-10 days.  If your surgeon used skin glue on the incision, you may shower in 24 hours.  The glue will flake off over the  next 2-3 weeks.  Any sutures or staples will be removed at the office during your follow-up visit. °8. ACTIVITIES:  You may resume regular daily activities (gradually increasing) beginning the next day.  Wearing a good support bra or sports bra minimizes pain and swelling.  You may have sexual intercourse when it is comfortable. °a. You may drive when you no longer are taking prescription pain medication, you can comfortably wear a seatbelt, and you can safely maneuver your car and apply brakes. °b. RETURN TO WORK:  ______________________________________________________________________________________ °9. You should see your doctor in the office for a follow-up appointment approximately two weeks after your surgery.  Your doctor’s nurse will typically make your follow-up appointment when she calls you with your pathology report.  Expect your pathology report 2-3 business days after your surgery.  You may call to check if you do not hear from us after three days. °10. OTHER INSTRUCTIONS: _______________________________________________________________________________________________ _____________________________________________________________________________________________________________________________________ °_____________________________________________________________________________________________________________________________________ °_____________________________________________________________________________________________________________________________________ ° °WHEN TO CALL YOUR DOCTOR: °1. Fever over 101.0 °2. Nausea and/or vomiting. °3. Extreme swelling or bruising. °4. Continued bleeding from incision. °5. Increased pain, redness, or drainage from the incision. ° °The clinic staff is available to answer your questions during regular business hours.  Please don’t hesitate to call and ask to speak to one of the nurses for clinical concerns.  If you have a medical emergency, go to the nearest  emergency room or call 911.  A surgeon from [facility] is always on call at the hospital. ° °For further questions, please visit [email or web address]  ° ° °  Regional Anesthesia Blocks  1. Numbness or the inability to move the "blocked" extremity may last from 3-48 hours after placement. The length of time depends on the medication injected and your individual response to the medication. If the numbness is not going away after 48 hours, call your surgeon.  2. The extremity that is blocked will need to be protected until the numbness is gone and the  Strength has returned. Because you cannot feel it, you will need to take extra care to avoid injury. Because it may be weak, you may have difficulty moving it or using it. You may not know what position it is in without looking at it while the block is in effect.  3. For blocks in the legs and feet, returning to weight bearing and walking needs to be done carefully. You will need to wait until the numbness is entirely gone and the strength has returned. You should be able to move your leg and foot normally before you try and bear weight or walk. You will need someone to be with you when you first try to ensure you do not fall and possibly risk injury.  4. Bruising and tenderness at the needle site are common side effects and will resolve in a few days.  5. Persistent numbness or new problems with movement should be communicated to the surgeon or the [facility] [phone] [facility] [phone]).   Post Anesthesia Home Care Instructions  Activity: Get plenty of rest for the remainder of the day. A responsible adult should stay with you for 24 hours following the procedure.  For the next 24 hours, DO NOT: -Drive a car -Paediatric nurse -Drink alcoholic beverages -Take any medication unless instructed by your physician -Make any legal decisions or sign important papers.  Meals: Start with liquid  foods such as gelatin or soup. Progress to regular foods as tolerated. Avoid greasy, spicy, heavy foods. If nausea and/or vomiting occur, drink only clear liquids until the nausea and/or vomiting subsides. Call your physician if vomiting continues.  Special Instructions/Symptoms: Your throat may feel dry or sore from the anesthesia or the breathing tube placed in your throat during surgery. If this causes discomfort, gargle with warm salt water. The discomfort should disappear within 24 hours.

## 2014-04-28 ENCOUNTER — Telehealth (INDEPENDENT_AMBULATORY_CARE_PROVIDER_SITE_OTHER): Payer: Self-pay

## 2014-04-28 NOTE — Telephone Encounter (Signed)
Informed Patient all of the lymph nodes right and left where negative for cancer . Dr. Dalbert Batman will discuss results on next ov

## 2014-04-28 NOTE — Progress Notes (Signed)
Quick Note:  Inform patient of Pathology report,.Tell her that all the lymph nodes on the right side and the left side were negative for cancer. Also tell her that there was no residual cancer in the lumpectomy specimen on the right. This is all excellent news. I will discuss with her in detail at her next office visit.  hmi ______

## 2014-04-29 ENCOUNTER — Telehealth (INDEPENDENT_AMBULATORY_CARE_PROVIDER_SITE_OTHER): Payer: Self-pay

## 2014-04-29 NOTE — Telephone Encounter (Signed)
LMOM for pt to call back and ask for triage. Pt can be advised of attached msg from Dr Dalbert Batman re: path result.

## 2014-04-29 NOTE — Telephone Encounter (Signed)
Message copied by Dois Davenport on Fri Apr 29, 2014 12:47 PM ------      Message from: Adin Hector      Created: Thu Apr 28, 2014 12:17 PM       Inform patient of Pathology report,.Tell her that all the lymph nodes on the right side and the left side were negative for cancer. Also tell her that there was no residual cancer in the lumpectomy specimen on the right. This is all excellent news. I will discuss with her in detail at her next office visit.            hmi ------

## 2014-04-29 NOTE — Telephone Encounter (Signed)
Pt called back. Pt notified of path result msg from Dr Dalbert Batman. Pt will keep appt for 8-19.

## 2014-05-10 ENCOUNTER — Other Ambulatory Visit (INDEPENDENT_AMBULATORY_CARE_PROVIDER_SITE_OTHER): Payer: Self-pay

## 2014-05-10 ENCOUNTER — Ambulatory Visit (INDEPENDENT_AMBULATORY_CARE_PROVIDER_SITE_OTHER): Payer: PRIVATE HEALTH INSURANCE | Admitting: General Surgery

## 2014-05-10 ENCOUNTER — Encounter (INDEPENDENT_AMBULATORY_CARE_PROVIDER_SITE_OTHER): Payer: Self-pay | Admitting: General Surgery

## 2014-05-10 VITALS — BP 122/76 | HR 77 | Temp 98.0°F | Ht 65.0 in | Wt 155.0 lb

## 2014-05-10 DIAGNOSIS — C50411 Malignant neoplasm of upper-outer quadrant of right female breast: Secondary | ICD-10-CM

## 2014-05-10 DIAGNOSIS — C50412 Malignant neoplasm of upper-outer quadrant of left female breast: Secondary | ICD-10-CM

## 2014-05-10 DIAGNOSIS — D0592 Unspecified type of carcinoma in situ of left breast: Secondary | ICD-10-CM

## 2014-05-10 DIAGNOSIS — C50419 Malignant neoplasm of upper-outer quadrant of unspecified female breast: Secondary | ICD-10-CM

## 2014-05-10 NOTE — Progress Notes (Addendum)
Patient ID: Valerie Heath, female   DOB: Nov 25, 1950, 63 y.o.   MRN: [id number]  History: This patient returns regard following her second operation for breast cancer.      This patient initially  underwent a left breast lumpectomy with radioactive seed localization on 02/21/2014. This was done for atypical lobular hyperplasia. 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 Margins are negative. Receptors positive. HER-2 negative Ki-67 8%.       RI was performed and shows a 10 mm area of enhancement in the upper outer quadrant of the right breast.  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 She developed a noticeable hematoma in the right breast.      She was counseled regarding her options, referral to medical oncology and radiation oncology and genetic counseling. She is strongly motivated for breast conservation and wanted to defer those consultations until she was postop.     On 04/27/2014 she underwent right partial mastectomy with radioactive seed localization and right axillary sentinel node  biopsy. She also underwent left axillary sentinel node biopsy.3 sentinel lymph nodes on the right and 2 sentinel lymph nodes on the left were all negative for cancer. There was no residual cancer in the right breast lumpectomy specimen.  Addendum: Genetic testing is negative for deleterious mutation   Exam: Patient looks well. No distress. Cooperative his usual baseline bilateral breast incisions and bilateral axillary incisions are healing normally. No infection or hematoma. No skin necrosis. Cosmesis very good. Contour normal. Nipple projection normal. No significant volume loss.   Assessment:    Invasive ductal carcinoma left breast, upper outer quadrant, receptor positive, HER-2-negative, pathologic stage TIc, N0. Recovering uneventfully following left partial mastectomy and left  axillary sentinel node biopsy    Ductal carcinoma in situ right breast, upper outer quadrant, receptor positive, pathologic stage Tis, N0 Recovering uneventfully following right partial mastectomy and right axilla sentinel node biopsy.     Addendum:Genetic testing is negative for deleterious mutation   Plan: Wound care and activities discussed Request Oncotype DX score on left side Ambulatory referral to medical oncology. I told her she would almost certainly be offered antiestrogen therapy. Whether or not she gets chemotherapy may depend on the Oncotype score... which we will ask for Ambulatory referral to radiation oncology. I told her she would need bilateral whole breast radiation Consideration will need to be given to a genetic counseling and genetic testing. Mother developed a fallopian tube cancer, and then 10 years later developed breast cancer at age 27. No other breast or ovarian cancer as mentioned. Her son return to see me in 6 weeks, sooner if there are any problems.    Edsel Petrin. Dalbert Batman, M.D., [facility] General and Minimally invasive Surgery Breast and Colorectal Surgery Office:   [phone] Pager:   [phone]

## 2014-05-10 NOTE — Patient Instructions (Signed)
You are recovering from your bilateral lumpectomy and bilateral axillary sentinel node biopsy without any obvious surgical complication.  Avoid sports, dirty activities, swiming pools or tubs  for another 2-3 weeks.  You will be referred to medical oncology  You will be referred to radiation oncology  Eventually, it would be a good idea to consider genetic counseling  Return to see Dr. Dalbert Batman in 6 weeks.

## 2014-05-11 ENCOUNTER — Encounter: Payer: Self-pay | Admitting: *Deleted

## 2014-05-11 NOTE — Progress Notes (Signed)
Received order for oncotype testing per Dr. Dalbert Batman. Requisition sent to pathology. Received by Alyse Low.

## 2014-05-16 ENCOUNTER — Encounter: Payer: Self-pay | Admitting: Radiation Oncology

## 2014-05-16 ENCOUNTER — Telehealth: Payer: Self-pay | Admitting: *Deleted

## 2014-05-16 NOTE — Telephone Encounter (Signed)
Left message for pt to return my call so I can schedule a med onc appt. 

## 2014-05-16 NOTE — Progress Notes (Signed)
Location of Breast Cancer: Invasive ductal carcinoma left breast, upper outer quadrant, ductal carcinoma in situ right breast, upper outer quadrant  Histology per Pathology Report:   02/21/14 Diagnosis Breast, lumpectomy, left, upper outer quadrant - INVASIVE DUCTAL CARCINOMA WITH CALCIFICATIONS, GRADE I/III, SPANNING 1.5 CM. - DUCTAL CARCINOMA IN SITU, INTERMEDIATE GRADE. - LOBULAR NEOPLASIA (LOBULAR CARCINOMA IN SITU). - THE SURGICAL RESECTION MARGINS ARE NEGATIVE FOR DUCTAL CARCINOMA. - SEE ONCOLOGY TABLE BELOW  03/21/14 Diagnosis Breast, right, needle core biopsy, anterior lateral - DUCTAL CARCINOMA IN SITU WITH CALCIFICATIONS. - ADENOSIS WITH CALCIFICATIONS. - SEE COMMENT.  04/27/14 Diagnosis 1. Breast, lumpectomy, Right - FIBROCYSTIC CHANGES WITH ADENOSIS AND CALCIFICATIONS. - HEALING BIOPSY SITE. - THERE IS NO EVIDENCE OF MALIGNANCY. - SEE ONCOLOGY TABLE BELOW. 2. Lymph node, sentinel, biopsy, Right axillary #1 - THERE IS NO EVIDENCE OF CARCINOMA IN 1 OF 1 LYMPH NODE (0/1). 3. Lymph node, sentinel, biopsy, Right axillary #2 - THERE IS NO EVIDENCE OF CARCINOMA IN 1 OF 1 LYMPH NODE (0/1). 4. Lymph node, sentinel, biopsy, Right axillary # 3 - THERE IS NO EVIDENCE OF CARCINOMA IN 1 OF 1 LYMPH NODE (0/1). 5. Lymph node, sentinel, biopsy, Left axillary #1 - - THERE IS NO EVIDENCE OF CARCINOMA IN 1 OF 1 LYMPH NODE (0/1). 6. Lymph node, sentinel, biopsy, Left axillary #2 - THERE IS NO EVIDENCE OF CARCINOMA IN 1 OF 1 LYMPH NODE (0/1).  Receptor Status: left breast ER(1005 +), PR (53%+), Her2-neu (negative) Right breast ER (100%+), PR (98%+)  Did patient present with symptoms (if so, please note symptoms) or was this found on screening mammography?: screening mammography  Past/Anticipated interventions by surgeon, if any: 02/21/14 - Procedure: BREAST LUMPECTOMY WITH RADIOACTIVE SEED LOCALIZATION;  Surgeon: Adin Hector, MD;  Location: [facility];  Service: General;   Laterality: Left; 04/27/2014 right partial mastectomy with radioactive seed localization and right axillary sentinel node biopsy.    Past/Anticipated interventions by medical oncology, if any:  Appointment with Dr. Lindi Adie 06/06/14  Lymphedema issues, if any:  no  Pain issues, if any:  No but is sore and swollen under her arms from surgery.  OBGYN history:  Patient was 63 years old with first period.  Has not used birth control pills.  She does not have any children.  Was 34 when she went through menopause.  She did not use estrogen.    SAFETY ISSUES:  Prior radiation? no  Pacemaker/ICD? no  Possible current pregnancy?no  Is the patient on methotrexate? no  Current Complaints / other details:  Patient is here with her husband.  She is retired from [employer]    Jacqulyn Liner, RN 05/16/2014,10:10 AM

## 2014-05-17 ENCOUNTER — Telehealth: Payer: Self-pay | Admitting: *Deleted

## 2014-05-17 NOTE — Telephone Encounter (Signed)
Pt returned my call and I confirmed 06/06/14 appt w/ her.  Mailed before appt letter, welcoming packet & intake form to pt.  Emailed Holley Raring and [facility] at [facility] to make them aware.

## 2014-05-18 ENCOUNTER — Encounter: Payer: Self-pay | Admitting: Radiation Oncology

## 2014-05-18 ENCOUNTER — Ambulatory Visit
Admission: RE | Admit: 2014-05-18 | Discharge: 2014-05-18 | Disposition: A | Discharge status: Home / Self Care | Payer: 59 | Source: Ambulatory Visit | Attending: Radiation Oncology | Admitting: Radiation Oncology

## 2014-05-18 VITALS — BP 158/90 | HR 83 | Temp 98.1°F | Resp 16 | Ht 65.0 in | Wt 154.5 lb

## 2014-05-18 DIAGNOSIS — Z170 Estrogen receptor positive status [ER+]: Secondary | ICD-10-CM | POA: Insufficient documentation

## 2014-05-18 DIAGNOSIS — D0590 Unspecified type of carcinoma in situ of unspecified breast: Secondary | ICD-10-CM | POA: Insufficient documentation

## 2014-05-18 DIAGNOSIS — C50411 Malignant neoplasm of upper-outer quadrant of right female breast: Secondary | ICD-10-CM

## 2014-05-18 DIAGNOSIS — C50419 Malignant neoplasm of upper-outer quadrant of unspecified female breast: Secondary | ICD-10-CM | POA: Diagnosis not present

## 2014-05-18 DIAGNOSIS — C50412 Malignant neoplasm of upper-outer quadrant of left female breast: Secondary | ICD-10-CM

## 2014-05-18 DIAGNOSIS — Z510 Encounter for antineoplastic radiation therapy: Secondary | ICD-10-CM | POA: Insufficient documentation

## 2014-05-18 DIAGNOSIS — E039 Hypothyroidism, unspecified: Secondary | ICD-10-CM | POA: Diagnosis not present

## 2014-05-18 HISTORY — DX: Malignant neoplasm of upper-outer quadrant of right female breast: C50.411

## 2014-05-18 HISTORY — DX: Malignant neoplasm of upper-outer quadrant of left female breast: C50.412

## 2014-05-18 NOTE — Progress Notes (Signed)
Please see the Nurse Progress Note in the MD Initial Consult Encounter for this patient. 

## 2014-05-18 NOTE — Progress Notes (Signed)
Radiation Oncology         [phone]) [phone] ________________________________  Initial outpatient Consultation  Name: Valerie Heath MRN: [id number]  Date: 05/18/2014  DOB: 20-Nov-1950  UT:MLYY, Valerie Saxon, MD  Adin Hector, MD   REFERRING PHYSICIAN: Adin Hector, MD  DIAGNOSIS: Intraductal carcinoma of the right breast, stage I invasive ductal carcinoma of the left breast (T1c, N0, Mx)   HISTORY OF PRESENT ILLNESS::Valerie Heath is a 63 y.o. female who is seen out courtesy of Dr. Lisabeth Register for an opinion concerning radiation therapy as part of management of patient's recently diagnosed bilateral breast cancer. Patient recently completed surgery directed at the left and right breast area.  the patient was interested in breast conservation therapy and initially underwent a left lumpectomy with radioactive seed localization. This was done for atypical lobular hyperplasia. The final pathology report showed invasive ductal carcinoma, grade 1/3 measuring 1.5 cm in size. There was also DCIS present. The margins were negative. The receptors were positive with HER-2/neu negative and Ki-67 8%.. additional imaging was performed and shows a 10 mm area of enhancement in the upper outer quadrant of the right breast. 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 . On 04/27/2014 she underwent right partial mastectomy with radioactive seed localization and right axillary sentinel node biopsy. She also underwent left axillary sentinel node biopsy.3 sentinel lymph nodes on the right and 2 sentinel lymph nodes on the left were all negative for cancer. There was no residual cancer in the right breast lumpectomy specimen. With this information the patient is now seen in radiation oncology to be considered for breast conservation therapy with treatment for bilateral breast cancer.   PREVIOUS RADIATION THERAPY: No  PAST MEDICAL HISTORY:  has a past  medical history of Hypothyroid; Fibroid; Cancer; Breast cancer of upper-outer quadrant of left female breast; and Breast cancer of upper-outer quadrant of right female breast.    PAST SURGICAL HISTORY: Past Surgical History  Procedure Laterality Date  . Tonsillectomy and adenoidectomy    . Myomectomy    . Shoulder surgery  2005    left  . Breast lumpectomy with radioactive seed localization Left 02/21/2014    Procedure: BREAST LUMPECTOMY WITH RADIOACTIVE SEED LOCALIZATION;  Surgeon: Adin Hector, MD;  Location: [facility];  Service: General;  Laterality: Left;  . Colonoscopy    . Lymph node dissection  04/27/14    right and left    FAMILY HISTORY: family history includes Brain cancer in her paternal uncle; Breast cancer (age of onset: 26) in her mother; Cancer in her mother; Colon cancer in her paternal uncle; Diabetes in her father; Heart disease in her father; Hypertension in her father and mother; Prostate cancer in her father and maternal uncle; Throat cancer in her maternal uncle.  SOCIAL HISTORY:  reports that she has never smoked. She does not have any smokeless tobacco history on file. She reports that she drinks about one ounce of alcohol per week. She reports that she does not use illicit drugs.  ALLERGIES: Erythromycin  MEDICATIONS:  Current Outpatient Prescriptions  Medication Sig Dispense Refill  . levothyroxine (SYNTHROID, LEVOTHROID) 125 MCG tablet Take 125 mcg by mouth daily.        . Calcium Carbonate-Vitamin D (CALCIUM + D PO) Take by mouth.      . Cholecalciferol (VITAMIN D PO) Take by mouth.      . Flaxseed Oil OIL 1,400 mg by Does  not apply route 3 (three) times a week.      Marland Kitchen glucosamine-chondroitin 500-400 MG tablet Take 1,875 tablets by mouth 5 (five) times daily.      . Multiple Vitamin (MULTIVITAMIN) tablet Take 1 tablet by mouth daily.      . NON FORMULARY 300 mg. 5 times a week      . NON FORMULARY Cherry extract 1500 mg 5 times a week        . Omega-3 Fatty Acids (FISH OIL PO) Take 1,000 mg by mouth 3 (three) times a week.        No current facility-administered medications for this encounter.    REVIEW OF SYSTEMS:  A 15 point review of systems is documented in the electronic medical record. This was obtained by the nursing staff. However, I reviewed this with the patient to discuss relevant findings and make appropriate changes.  No significant tenderness in either breast. Prior to diagnosis she denies any nipple discharge or bleeding from either breast. She denies a new bony pain headaches dizziness or blurred vision. Patient denies the problems with swelling in her arms or hands   PHYSICAL EXAM:  height is 5' 5"  (1.651 m) and weight is 154 lb 8 oz (70.081 kg). Her oral temperature is 98.1 F (36.7 C). Her blood pressure is 158/90 and her pulse is 83. Her respiration is 16.   BP 158/90  Pulse 83  Temp(Src) 98.1 F (36.7 C) (Oral)  Resp 16  Ht 5' 5"  (1.651 m)  Wt 154 lb 8 oz (70.081 kg)  BMI 25.71 kg/m2  General Appearance:    Alert, cooperative, no distress, appears stated age, accompanied by her husband on evaluation today   Head:    Normocephalic, without obvious abnormality, atraumatic  Eyes:    PERRL, conjunctiva/corneas clear, EOM's intact,     Ears:    Normal TM's and external ear canals, both ears  Nose:   Nares normal, septum midline, mucosa normal, no drainage    or sinus tenderness  Throat:   Lips, mucosa, and tongue normal; teeth and gums normal  Neck:   Supple, symmetrical, trachea midline, no adenopathy;    thyroid:  no enlargement/tenderness/nodules; no carotid   bruit or JVD  Back:     Symmetric, no curvature, ROM normal, no CVA tenderness  Lungs:     Clear to auscultation bilaterally, respirations unlabored  Chest Wall:    No tenderness or deformity   Heart:    Regular rate and rhythm, S1 and S2 normal, no murmur, rub   or gallop  Breast Exam:    Left:No tenderness, masses, or nipple abnormality,  well-healed scar in the upper outer quadrant, a separate scar and axillary area well-healed Right breast: No tenderness mass or nipple abnormality a curvi linear scar is present in the 9 clock position of the areolar border, a separate scar in the axillary region, healing well   Abdomen:     Soft, non-tender, bowel sounds active all four quadrants,    no masses, no organomegaly        Extremities:   Extremities normal, atraumatic, no cyanosis or edema  Pulses:   2+ and symmetric all extremities  Skin:   Skin color, texture, turgor normal, no rashes or lesions  Lymph nodes:   Cervical, supraclavicular, and axillary nodes normal  Neurologic:   normal strength, sensation and reflexes    throughout     ECOG = 0  0 - Asymptomatic (Fully active, able to  carry on all predisease activities without restriction)  LABORATORY DATA:  Lab Results  Component Value Date   HGB 14.3 04/27/2014   No results found for this basename: NA, K, CL, CO2, GLUCOSE,  BUN, CREATININE, CALCIUM      RADIOGRAPHY: Nm Sentinel Node Inj-no Rpt (breast)  04/27/2014   CLINICAL DATA: Bilateral breast cancer. Need bilateral breast injection  for bilateral sentinel node biopsy.   Sulfur colloid was injected intradermally by the nuclear medicine  technologist for breast cancer sentinel node localization.    Mm Breast Surgical Specimen  04/27/2014   CLINICAL DATA:  63 year old female with radioactive seed localization of right breast cancer. Surgical excision and surgical specimen.  EXAM: SPECIMEN RADIOGRAPH OF THE RIGHT BREAST  COMPARISON:  Previous exam(s)  FINDINGS: Status post excision of the right breast. The radioactive seed and biopsy marker clip are present, completely intact, and are marked for pathology.  IMPRESSION: Specimen radiograph of the right breast.   Electronically Signed   By: Hassan Rowan M.D.   On: 04/27/2014 12:00   Mm Rt Plc Breast Loc Dev   1st Lesion  Inc Mammo Guide  04/26/2014   CLINICAL DATA:  Diagnosis  of DCIS in the anterior 1/3 of the right breast on MRI guided biopsy 03/21/2014. Prior left breast lumpectomy on 02/21/2014, pathology invasive ductal carcinoma, DCIS and LCIS. Preoperative radioactive seed localization of the right breast.  EXAM: MAMMOGRAPHIC GUIDED RADIOACTIVE SEED LOCALIZATION OF THE RIGHT BREAST  COMPARISON:  Previous exam(s).  FINDINGS: Patient presents for radioactive seed localization prior to right breast lumpectomy. I met with the patient and we discussed the procedure of seed localization including benefits and alternatives. We discussed the high likelihood of a successful procedure. We discussed the risks of the procedure including infection, bleeding, tissue injury and further surgery. We discussed the low dose of radioactivity involved in the procedure. Informed, written consent was given.  The usual time-out protocol was performed immediately prior to the procedure.  Using mammographic guidance, sterile technique, 2% lidocaine and an I-125 radioactive seed, the cylinder shaped tissue marker clip was localized using a superior approach. The follow-up mammogram images confirm the seed in the expected location and are marked for Dr. Dalbert Batman.  Follow-up survey of the patient confirms the presence of the radioactive seed in the right breast.  Order number of I-125 seed:  [id number].  Dose of I-125 seed:  0.232 mCi.  The patient tolerated the procedure well and was [facility]. She was given instructions regarding seed removal.  IMPRESSION: Radioactive seed localization of the right breast. No apparent complications.   Electronically Signed   By: Evangeline Dakin M.D.   On: 04/26/2014 13:10      IMPRESSION: Bilateral breast cancer. Patient has intraductal breast cancer along the right side and a stage I invasive ductal carcinoma along the left side. She has clear margins on both lumpectomy specimens. She would be a good candidate for breast conservation therapy with  radiation therapy directed at both the left and right breast area. I discussed the treatment course,  side effects and potential toxicities of radiation therapy in this situation with the patient. She appears to understand and wishes to proceed with planned course of treatment.  PLAN: Tentative date for simulation is September 15 after her medical oncology consultation. If adjuvant chemotherapy is not recommended then she will proceed with simulation and planning on that date.  I spent 60 minutes minutes face to face with the patient and  more than 50% of that time was spent in counseling and/or coordination of care.   ------------------------------------------------  Blair Promise, PhD, MD

## 2014-05-20 ENCOUNTER — Encounter: Payer: Self-pay | Admitting: *Deleted

## 2014-05-20 NOTE — Progress Notes (Signed)
[facility] Psychosocial Distress Screening Clinical Social Work  Clinical Social Work was referred by distress screening protocol.  The patient scored a 5 on the Psychosocial Distress Thermometer which indicates mild distress. Clinical Social Worker phoned pt to assess for distress and other psychosocial needs. Pt very open to talking with CSW over the phone and is eager for her appointment in the coming weeks. CSW educated pt about [facility] and resources to assist her as she begins her cancer journey. Pt reports she has a case Freight forwarder through [employer] and CSW explained that may be a good resource for additional insurance questions. She is curious what her insurance will cover and hopes to get more answers when she comes to her appointment on 06/06/14.   CSW discussed other resources to assist and pt agrees to seek out CSW team as needed.    ONCBCN DISTRESS SCREENING 05/18/2014  Screening Type Initial Screening  Mark the number that describes how much distress you have been experiencing in the past week 5  Practical problem type Insurance  Emotional problem type Adjusting to illness  Information Concerns Type Lack of info about treatment;Lack of info about maintaining fitness    Clinical Social Worker follow up needed: No.  If yes, follow up plan: See above Loren Racer, [address] [place] for [place] Wednesday, Thursday and Friday Phone: [phone] Fax: [phone]

## 2014-05-23 ENCOUNTER — Telehealth: Payer: Self-pay | Admitting: Oncology

## 2014-05-23 NOTE — Addendum Note (Signed)
Encounter addended by: Jacqulyn Liner, RN on: 05/23/2014 10:06 AM<BR>     Documentation filed: Charges VN

## 2014-05-23 NOTE — Telephone Encounter (Signed)
Valerie Heath called and asked which vitamins/supplements are OK to take while having radiation.  Advised her not to take vitamins A and E and vitamin C should not exceed 1000 mg/day.  Advised her that taking a multivitamin is OK as well as fish oil and glucosamine.  Valerie Heath verbalized agreement.

## 2014-05-27 ENCOUNTER — Encounter: Payer: Self-pay | Admitting: *Deleted

## 2014-05-27 ENCOUNTER — Encounter (HOSPITAL_COMMUNITY): Payer: Self-pay

## 2014-05-27 NOTE — Progress Notes (Signed)
Received oncotype score of 8/6%. Pt care team notified.

## 2014-06-06 ENCOUNTER — Ambulatory Visit: Payer: No Typology Code available for payment source

## 2014-06-06 ENCOUNTER — Encounter: Payer: Self-pay | Admitting: Hematology and Oncology

## 2014-06-06 ENCOUNTER — Ambulatory Visit (HOSPITAL_BASED_OUTPATIENT_CLINIC_OR_DEPARTMENT_OTHER): Payer: 59 | Admitting: Hematology and Oncology

## 2014-06-06 VITALS — BP 130/72 | HR 90 | Temp 98.2°F | Resp 20 | Ht 65.0 in | Wt 153.9 lb

## 2014-06-06 DIAGNOSIS — C50419 Malignant neoplasm of upper-outer quadrant of unspecified female breast: Secondary | ICD-10-CM

## 2014-06-06 DIAGNOSIS — C50411 Malignant neoplasm of upper-outer quadrant of right female breast: Secondary | ICD-10-CM

## 2014-06-06 DIAGNOSIS — C50412 Malignant neoplasm of upper-outer quadrant of left female breast: Secondary | ICD-10-CM

## 2014-06-06 MED ORDER — ANASTROZOLE 1 MG PO TABS: 1.0000 mg | ORAL_TABLET | Freq: Every day | ORAL | Status: DC

## 2014-06-06 NOTE — Assessment & Plan Note (Signed)
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 Oncotype DX recurrence score was 8 which translates into a 6% risk of distant recurrence with antiestrogen therapy alone. I reviewed the significance of ER PR HER-2 receptors as well as the significance of Oncotype DX testing in great detail. Based on low risk of recurrence I do not recommend chemotherapy. I strongly recommended adjuvant antiestrogen therapy. Once radiation therapy is complete I instructed her to start anastrozole 1 mg daily.  I discussed the risks and benefits of anti-estrogen therapy with aromatase inhibitors. These include but not limited to insomnia, hot flashes, mood changes, vaginal dryness, bone density loss, and weight gain. Although rare, serious side effects including endometrial cancer, risk of blood clots were also discussed. We strongly believe that the benefits far outweigh the risks. Patient understands these risks and consented to starting treatment. Planned treatment duration is 5 years.  Patient will start antiestrogen therapy November 1 and I would like to see her back in December 1 to review any adverse effects to treatment.

## 2014-06-06 NOTE — Progress Notes (Signed)
[facility] CONSULT NOTE  Patient Care Team: Marton Redwood, MD as PCP - General (Internal Medicine)  CHIEF COMPLAINTS/PURPOSE OF CONSULTATION:  Newly diagnosed breast cancer  HISTORY OF PRESENTING ILLNESS:  Valerie Heath 63 y.o. 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 Patient had a routine screening mammogram in March 2015 that revealed suspicious changes but was later evaluated with an MRI that showed abnormalities in the right breast as well. 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 She underwent lumpectomy in the right breast that showed fibrocystic changes. At the same time in August 2015 she had bilateral sentinel lymph node study is both of which came back negative for cancer involvement. She has been set up with radiation oncology to undergo adjuvant radiation therapy. Her Oncotype DX recurrence score on the left breast and back as 8 suggesting a 6% risk of distant recurrence. She is here today to discuss the role of antiestrogen therapy.  I reviewed her records extensively and collaborated the history with the patient.  SUMMARY OF ONCOLOGIC HISTORY: Oncology History       Breast cancer of upper-outer quadrant of right female breast   03/29/2014 Initial Diagnosis MRI finding of suspicious changes   04/27/2014 Surgery Right breast lumpectomy: Fibrocystic changes; 5 sentinel lymph nodes negative    Breast cancer of upper-outer quadrant of left female breast   03/29/2014 Initial Diagnosis Breast cancer of upper-outer quadrant of left female breast   04/27/2014 Surgery Left breast lumpectomy: Invasive ductal carcinoma grade 1/3; 1.5 cm with DCIS, ER 100%, PR 53%, Ki-67 8%, HER-2 negative ratio 1.21    In terms of breast cancer risk profile:  She menarched at early age of 77 and  went to menopause at age 20  She had 0 pregnancy,  She has not received birth control pills for approximately 0.  She was never exposed to fertility medications or hormone replacement therapy.  She has  family history of Breast cancer  MEDICAL HISTORY:  Past Medical History  Diagnosis Date  . Hypothyroid   . Fibroid   . Cancer     breast cancer-lt  . Breast cancer of upper-outer quadrant of left female breast   . Breast cancer of upper-outer quadrant of right female breast     SURGICAL HISTORY: Past Surgical History  Procedure Laterality Date  . Tonsillectomy and adenoidectomy    . Myomectomy    . Shoulder surgery  2005    left  . Breast lumpectomy with radioactive seed localization Left 02/21/2014    Procedure: BREAST LUMPECTOMY WITH RADIOACTIVE SEED LOCALIZATION;  Surgeon: Adin Hector, MD;  Location: [facility];  Service: General;  Laterality: Left;  . Colonoscopy    . Lymph node dissection  04/27/14    right and left    SOCIAL HISTORY: History   Social History  . Marital Status: Married    Spouse Name: N/A    Number of Children: 0  . Years of Education: N/A   Occupational History  . retired - Sales executive for [employer]  . Smoking status: Never Smoker   . Smokeless tobacco: Never Used  . Alcohol Use: 1.0 oz/week    2 drink(s) per week     Comment: Sopcially  . Drug Use: No  . Sexual Activity: Yes  Birth Control/ Protection: Post-menopausal   Other Topics Concern  . Not on file   Social History Narrative  . No narrative on file    FAMILY HISTORY: Family History  Problem Relation Age of Onset  . Hypertension Mother   . Breast cancer Mother 15  . Cancer Mother     Fallopian tube tumor  . Diabetes Father   . Hypertension Father   . Heart disease Father   . Prostate cancer Father   . Colon cancer Paternal Uncle   . Brain cancer Paternal Uncle   . Throat cancer Maternal Uncle   . Prostate cancer  Maternal Uncle   . Breast cancer Maternal Aunt   . Leukemia Paternal Grandfather   . Leukemia Maternal Aunt     ALLERGIES:  is allergic to erythromycin.  MEDICATIONS:  Current Outpatient Prescriptions  Medication Sig Dispense Refill  . Calcium Carbonate-Vitamin D (CALCIUM + D PO) Take by mouth.      . Cholecalciferol (VITAMIN D PO) Take by mouth.      . Flaxseed Oil OIL 1,400 mg by Does not apply route 3 (three) times a week.      Marland Kitchen glucosamine-chondroitin 500-400 MG tablet Take 1,875 tablets by mouth 5 (five) times daily.      Marland Kitchen levothyroxine (SYNTHROID, LEVOTHROID) 125 MCG tablet Take 125 mcg by mouth daily.        . NON FORMULARY 300 mg. 5 times a week      . Omega-3 Fatty Acids (FISH OIL PO) Take 1,000 mg by mouth 3 (three) times a week.       Marland Kitchen anastrozole (ARIMIDEX) 1 MG tablet Take 1 tablet (1 mg total) by mouth daily.  30 tablet  0  . Multiple Vitamin (MULTIVITAMIN) tablet Take 1 tablet by mouth daily.      . NON FORMULARY Cherry extract 1500 mg 5 times a week       No current facility-administered medications for this visit.    REVIEW OF SYSTEMS:   Constitutional: Denies fevers, chills or abnormal night sweats Eyes: Denies blurriness of vision, double vision or watery eyes Ears, nose, mouth, throat, and face: Denies mucositis or sore throat Respiratory: Denies cough, dyspnea or wheezes Cardiovascular: Denies palpitation, chest discomfort or lower extremity swelling Gastrointestinal:  Denies nausea, heartburn or change in bowel habits Skin: Denies abnormal skin rashes Lymphatics: Denies new lymphadenopathy or easy bruising Neurological:Denies numbness, tingling or new weaknesses Behavioral/Psych: Mood is stable, no new changes  Breast: Tenderness in the breast and axilla from recent surgeries All other systems were reviewed with the patient and are negative.  PHYSICAL EXAMINATION: ECOG PERFORMANCE STATUS: 0 - Asymptomatic  Filed Vitals:   06/06/14 1444  BP: 130/72   Pulse: 90  Temp: 98.2 F (36.8 C)  Resp: 20   Filed Weights   06/06/14 1444  Weight: 153 lb 14.4 oz (69.809 kg)    GENERAL:alert, no distress and comfortable SKIN: skin color, texture, turgor are normal, no rashes or significant lesions EYES: normal, conjunctiva are pink and non-injected, sclera clear OROPHARYNX:no exudate, no erythema and lips, buccal mucosa, and tongue normal  NECK: supple, thyroid normal size, non-tender, without nodularity LYMPH:  no palpable lymphadenopathy in the cervical, axillary or inguinal LUNGS: clear to auscultation and percussion with normal breathing effort HEART: regular rate & rhythm and no murmurs and no lower extremity edema ABDOMEN:abdomen soft, non-tender and normal bowel sounds Musculoskeletal:no cyanosis of digits and no clubbing  PSYCH: alert & oriented x 3 with  fluent speech NEURO: no focal motor/sensory deficits   LABORATORY DATA:  I have reviewed the data as listed Lab Results  Component Value Date   HGB 14.3 04/27/2014   No results found for this basename: NA, K, CL, CO2    RADIOGRAPHIC STUDIES: I have personally reviewed the radiological reports and agreed with the findings in the report.  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 Oncotype DX recurrence score was 8 which translates into a 6% risk of distant recurrence with antiestrogen therapy alone. I reviewed the significance of ER PR HER-2 receptors as well as the significance of Oncotype DX testing in great detail. Based on low risk of recurrence I do not recommend chemotherapy. I strongly recommended adjuvant antiestrogen therapy. Once radiation therapy is complete I instructed her to start anastrozole 1 mg daily.  I discussed the risks and  benefits of anti-estrogen therapy with aromatase inhibitors. These include but not limited to insomnia, hot flashes, mood changes, vaginal dryness, bone density loss, and weight gain. Although rare, serious side effects including endometrial cancer, risk of blood clots were also discussed. We strongly believe that the benefits far outweigh the risks. Patient understands these risks and consented to starting treatment. Planned treatment duration is 5 years.  Patient will start antiestrogen therapy November 1 and I would like to see her back in December 1 to review any adverse effects to treatment.   Breast cancer of upper-outer quadrant of right female breast Right breast DCIS ER/PR positive: I recommended that antiestrogen therapy being given for the left breast cancer would also be effective in decreasing the risk of recurrence of invasive cancer and the right breast.  Surveillance: Once a year mammograms will still be the standard of care with periodic breast exams. Survivorship:Discussed the importance of physical exercise in decreasing the likelihood of breast cancer recurrence. Recommended 30 mins daily 6 days a week of either brisk walking or cycling or swimming. Encouraged patient to eat more fruits and vegetables and decrease red meat.    All questions were answered. The patient knows to call the clinic with any problems, questions or concerns. I spent 55 minutes counseling the patient face to face. The total time spent in the appointment was 60 minutes and more than 50% was on counseling.     Rulon Eisenmenger, MD 06/06/2014 3:51 PM

## 2014-06-06 NOTE — Assessment & Plan Note (Signed)
Right breast DCIS ER/PR positive: I recommended that antiestrogen therapy being given for the left breast cancer would also be effective in decreasing the risk of recurrence of invasive cancer and the right breast.  Surveillance: Once a year mammograms will still be the standard of care with periodic breast exams. Survivorship:Discussed the importance of physical exercise in decreasing the likelihood of breast cancer recurrence. Recommended 30 mins daily 6 days a week of either brisk walking or cycling or swimming. Encouraged patient to eat more fruits and vegetables and decrease red meat.

## 2014-06-07 ENCOUNTER — Telehealth: Payer: Self-pay | Admitting: Hematology and Oncology

## 2014-06-07 ENCOUNTER — Telehealth: Payer: Self-pay | Admitting: *Deleted

## 2014-06-07 ENCOUNTER — Ambulatory Visit
Admission: RE | Admit: 2014-06-07 | Discharge: 2014-06-07 | Disposition: A | Discharge status: Home / Self Care | Payer: 59 | Source: Ambulatory Visit | Attending: Radiation Oncology | Admitting: Radiation Oncology

## 2014-06-07 DIAGNOSIS — C50412 Malignant neoplasm of upper-outer quadrant of left female breast: Secondary | ICD-10-CM

## 2014-06-07 DIAGNOSIS — Z510 Encounter for antineoplastic radiation therapy: Secondary | ICD-10-CM | POA: Diagnosis not present

## 2014-06-07 NOTE — Telephone Encounter (Signed)
Received request from Coralyn Mark Investment banker, corporate) to schedule pt for genetics.  Called and left a message for the pt to return my call so I can schedule her.

## 2014-06-07 NOTE — Telephone Encounter (Signed)
, °

## 2014-06-07 NOTE — Progress Notes (Signed)
MD created note from office visit - copy to patient.  Original to scan.

## 2014-06-08 ENCOUNTER — Telehealth: Payer: Self-pay | Admitting: *Deleted

## 2014-06-08 NOTE — Telephone Encounter (Signed)
Pt returned my call and does not want to schedule the genetic test yet.  Will let Dr. Sondra Come or [place] know when she is ready.

## 2014-06-09 ENCOUNTER — Ambulatory Visit (INDEPENDENT_AMBULATORY_CARE_PROVIDER_SITE_OTHER): Payer: 59 | Admitting: Gynecology

## 2014-06-09 ENCOUNTER — Encounter: Payer: Self-pay | Admitting: Gynecology

## 2014-06-09 ENCOUNTER — Other Ambulatory Visit (HOSPITAL_COMMUNITY)
Admission: RE | Admit: 2014-06-09 | Discharge: 2014-06-09 | Disposition: A | Discharge status: Home / Self Care | Payer: 59 | Source: Ambulatory Visit | Attending: Gynecology | Admitting: Gynecology

## 2014-06-09 VITALS — BP 124/76 | Ht 64.0 in | Wt 152.0 lb

## 2014-06-09 DIAGNOSIS — Z01419 Encounter for gynecological examination (general) (routine) without abnormal findings: Secondary | ICD-10-CM | POA: Diagnosis not present

## 2014-06-09 DIAGNOSIS — C50919 Malignant neoplasm of unspecified site of unspecified female breast: Secondary | ICD-10-CM

## 2014-06-09 DIAGNOSIS — Z1151 Encounter for screening for human papillomavirus (HPV): Secondary | ICD-10-CM | POA: Diagnosis present

## 2014-06-09 NOTE — Progress Notes (Signed)
  Radiation Oncology         [phone] ________________________________  Name: Valerie Heath MRN: [id number]  Date: 06/07/2014  DOB: May 03, 1951  SIMULATION AND TREATMENT PLANNING NOTE  DIAGNOSIS : Intraductal carcinoma of the right breast, stage I invasive ductal carcinoma of the left breast (T1c, N0, Mx)   NARRATIVE:  The patient was brought to the [facility].  Identity was confirmed.  All relevant records and images related to the planned course of therapy were reviewed.  The patient freely provided informed written consent to proceed with treatment after reviewing the details related to the planned course of therapy. The consent form was witnessed and verified by the simulation staff.  Then, the patient was set-up in a stable reproducible  supine position for radiation therapy.  CT images were obtained.  Surface markings were placed.  The CT images were loaded into the planning software.  Then the target and avoidance structures were contoured.  Treatment planning then occurred.  The radiation prescription was entered and confirmed.  Then, I designed and supervised the construction of a total of 1 medically necessary complex treatment devices.  I have requested : 3D Simulation  I have requested a DVH of the following structures: Lumpectomy cavities in both the left and right breast, lungs, heart..  I have ordered:dose calc.  PLAN:  The patient will receive 50.4 Gy in 28 fractions directed at the right breast. The patient will then undergo additional planning and daily with a boost field directed at the lumpectomy cavity and continue to a cumulative dose of 60.4 gray  The patient will receive 50.4 gray in 28 fractions directed at the left breast. The patient will then undergo additional planning and continue with a boost field directed at the lumpectomy cavity within the left breast  to a cumulative dose of 60.4  gray  ________________________________  -----------------------------------  Blair Promise, PhD, MD

## 2014-06-09 NOTE — Progress Notes (Signed)
Valerie Heath [birth year] [id number]        63 y.o.  G0P0 for annual exam.  Several issues noted below.  Past medical history,surgical history, problem list, medications, allergies, family history and social history were all reviewed and documented as reviewed in the EPIC chart.  ROS:  12 system ROS performed with pertinent positives and negatives included in the history, assessment and plan.   Additional significant findings :  none   Exam: Kim Counsellor Vitals:   06/09/14 1137  BP: 124/76  Height: 5\' 4"  (1.626 m)  Weight: 152 lb (68.947 kg)   General appearance:  Normal affect, orientation and appearance. Skin: Grossly normal HEENT: Without gross lesions.  No cervical or supraclavicular adenopathy. Thyroid normal.  Lungs:  Clear without wheezing, rales or rhonchi Cardiac: RR, without RMG Abdominal:  Soft, nontender, without masses, guarding, rebound, organomegaly or hernia Breasts:  Examined lying and sitting without masses, retractions, discharge or axillary adenopathy.  Well-healed left lumpectomy/lymph node incisions. Right with healing lumpectomy/lymph node incisions Pelvic:  Ext/BUS/vagina with generalized atrophic changes.  Slight whitish changes perineal body consistent with her history of lichen sclerosis.  Cervix atrophic. Pap/HPV  Uterus anteverted, normal size, shape and contour, midline and mobile nontender   Adnexa  Without masses or tenderness    Anus and perineum  Normal   Rectovaginal  Normal sphincter tone without palpated masses or tenderness.    Assessment/Plan:  63 y.o. G0P0 female for annual exam.   1. Recent diagnoses with both right and left breast cancer. Starting radiation treatment bilaterally. Plan is to start Arimidex afterwards.  Does have a history of fallopian tube cancer and breast cancer in her mother. She was never genetically tested by the patient's history. The patient is planning genetic testing per recommendations from her oncologist. I  reviewed possible risks of ovarian/look into cancer with her. Recommend baseline ultrasound now. If genetic positive strongly recommend prophylactic salpingo-oophorectomy. Possibly consider prophylactic salpingo-oophorectomy regardless if genetic negative. Will rediscuss after ultrasound. 2. Postmenopausal/atrophic genital changes. Patient without significant symptoms of hot flushes, night sweats, dyspareunia or vaginal dryness.  No vaginal bleeding. Plan observation at this time. Report any vaginal bleeding. 3. Lichen sclerosus. Doing well without significant symptoms. Will use Temovate cream as needed. 4. Pap smear 2012. Pap/HPV done today. No history of significant abnormal Pap smears previously. 5. DEXA 2013. Patient is going to repeat this year at Dr. Raul Del office for new baseline. Discussed potential for accelerated bone loss with Arimidex.   6. Colonoscopy 2011. Repeat at their recommended interval. 7. Of maintenance. No routine blood work done as this is done at Dr. Raul Del office. Follow up for ultrasound.     Anastasio Auerbach MD, 12:10 PM 06/09/2014

## 2014-06-09 NOTE — Patient Instructions (Signed)
Follow up for genetic testing for breast cancer gene Follow up for ultrasound as scheduled.

## 2014-06-09 NOTE — Addendum Note (Signed)
Encounter addended by: Blair Promise, MD on: 06/09/2014  5:46 PM<BR>     Documentation filed: Visit Diagnoses, Notes Section

## 2014-06-09 NOTE — Progress Notes (Signed)
  Radiation Oncology         [phone] ________________________________  Name: Valerie Heath MRN: [id number]  Date: 06/07/2014  DOB: 03-Feb-1951  Optical Surface Tracking Plan:  Since intensity modulated radiotherapy (IMRT) and 3D conformal radiation treatment methods are predicated on accurate and precise positioning for treatment, intrafraction motion monitoring is medically necessary to ensure accurate and safe treatment delivery.  The ability to quantify intrafraction motion without excessive ionizing radiation dose can only be performed with optical surface tracking. Accordingly, surface imaging offers the opportunity to obtain 3D measurements of patient position throughout IMRT and 3D treatments without excessive radiation exposure.  I am ordering optical surface tracking for this patient's upcoming course of radiotherapy. ________________________________  Blair Promise, MD 06/09/2014 5:45 PM    Reference:   Ursula Alert, J, et al. Surface imaging-based analysis of intrafraction motion for breast radiotherapy patients.Journal of [place], n. 6, nov. 2014. ISSN [id number].   Available at: <http://www.jacmp.org/index.php/jacmp/article/view/4957>.

## 2014-06-10 LAB — URINALYSIS W MICROSCOPIC + REFLEX CULTURE
Bacteria, UA: NONE SEEN
Bilirubin Urine: NEGATIVE
Casts: NONE SEEN
Crystals: NONE SEEN
Glucose, UA: NEGATIVE mg/dL
Hgb urine dipstick: NEGATIVE
Ketones, ur: NEGATIVE mg/dL
Leukocytes, UA: NEGATIVE
Nitrite: NEGATIVE
Protein, ur: NEGATIVE mg/dL
Specific Gravity, Urine: 1.006 (ref 1.005–1.030)
Squamous Epithelial / LPF: NONE SEEN
Urobilinogen, UA: 0.2 mg/dL (ref 0.0–1.0)
pH: 6.5 (ref 5.0–8.0)

## 2014-06-13 DIAGNOSIS — Z510 Encounter for antineoplastic radiation therapy: Secondary | ICD-10-CM | POA: Diagnosis not present

## 2014-06-13 LAB — CYTOLOGY - PAP

## 2014-06-15 ENCOUNTER — Ambulatory Visit
Admission: RE | Admit: 2014-06-15 | Discharge: 2014-06-15 | Disposition: A | Discharge status: Home / Self Care | Payer: 59 | Source: Ambulatory Visit | Attending: Radiation Oncology | Admitting: Radiation Oncology

## 2014-06-15 DIAGNOSIS — C50412 Malignant neoplasm of upper-outer quadrant of left female breast: Secondary | ICD-10-CM

## 2014-06-15 DIAGNOSIS — C50411 Malignant neoplasm of upper-outer quadrant of right female breast: Secondary | ICD-10-CM

## 2014-06-15 DIAGNOSIS — Z510 Encounter for antineoplastic radiation therapy: Secondary | ICD-10-CM | POA: Diagnosis not present

## 2014-06-15 MED ORDER — ALRA NON-METALLIC DEODORANT (RAD-ONC)
1.0000 "application " | Freq: Once | TOPICAL | Status: AC
  Administered 2014-06-15: 1 via TOPICAL

## 2014-06-15 MED ORDER — RADIAPLEXRX EX GEL
Freq: Once | CUTANEOUS | Status: AC
  Administered 2014-06-15: 18:00:00 via TOPICAL

## 2014-06-15 NOTE — Progress Notes (Signed)
Patient given Radiation Therapy and You book and discussed potential side effects/management of fatigue and skin changes.  She was also given the skin care handout. She was given Alra Deoderant and radiaplex gel.  She was instructed to apply the radiaplex twice a day after treatment and at bedtime.  She was advised to call with questions and concerns.

## 2014-06-15 NOTE — Progress Notes (Signed)
  Radiation Oncology         [phone] ________________________________  Name: Valerie Heath MRN: [id number]  Date: 06/15/2014  DOB: 01-21-1951  Simulation Verification Note  Status: outpatient  NARRATIVE: The patient was brought to the treatment unit and placed in the planned treatment position. The clinical setup was verified. Then port films were obtained and uploaded to the radiation oncology medical record software.  The treatment beams were carefully compared against the planned radiation fields. The position location and shape of the radiation fields was reviewed. They targeted volume of tissue appears to be appropriately covered by the radiation beams. Organs at risk appear to be excluded as planned.  Based on my personal review, I approved the simulation verification. The patient's treatment will proceed as planned.  -----------------------------------  Blair Promise, PhD, MD

## 2014-06-16 ENCOUNTER — Ambulatory Visit
Admission: RE | Admit: 2014-06-16 | Discharge: 2014-06-16 | Disposition: A | Discharge status: Home / Self Care | Payer: 59 | Source: Ambulatory Visit | Attending: Radiation Oncology | Admitting: Radiation Oncology

## 2014-06-16 DIAGNOSIS — Z510 Encounter for antineoplastic radiation therapy: Secondary | ICD-10-CM | POA: Diagnosis not present

## 2014-06-17 ENCOUNTER — Ambulatory Visit
Admission: RE | Admit: 2014-06-17 | Discharge: 2014-06-17 | Disposition: A | Discharge status: Home / Self Care | Payer: 59 | Source: Ambulatory Visit | Attending: Radiation Oncology | Admitting: Radiation Oncology

## 2014-06-17 DIAGNOSIS — Z510 Encounter for antineoplastic radiation therapy: Secondary | ICD-10-CM | POA: Diagnosis not present

## 2014-06-20 ENCOUNTER — Ambulatory Visit
Admission: RE | Admit: 2014-06-20 | Discharge: 2014-06-20 | Disposition: A | Discharge status: Home / Self Care | Payer: 59 | Source: Ambulatory Visit | Attending: Radiation Oncology | Admitting: Radiation Oncology

## 2014-06-20 DIAGNOSIS — Z510 Encounter for antineoplastic radiation therapy: Secondary | ICD-10-CM | POA: Diagnosis not present

## 2014-06-21 ENCOUNTER — Ambulatory Visit
Admission: RE | Admit: 2014-06-21 | Discharge: 2014-06-21 | Disposition: A | Discharge status: Home / Self Care | Payer: 59 | Source: Ambulatory Visit | Attending: Radiation Oncology | Admitting: Radiation Oncology

## 2014-06-21 ENCOUNTER — Encounter: Payer: Self-pay | Admitting: Radiation Oncology

## 2014-06-21 ENCOUNTER — Encounter: Payer: No Typology Code available for payment source | Admitting: Radiation Oncology

## 2014-06-21 VITALS — BP 147/70 | HR 79 | Temp 98.1°F | Resp 16 | Ht 64.0 in | Wt 155.4 lb

## 2014-06-21 DIAGNOSIS — Z510 Encounter for antineoplastic radiation therapy: Secondary | ICD-10-CM | POA: Diagnosis not present

## 2014-06-21 DIAGNOSIS — C50412 Malignant neoplasm of upper-outer quadrant of left female breast: Secondary | ICD-10-CM

## 2014-06-21 NOTE — Progress Notes (Addendum)
Shantell has completed 4/28 fractions to her right and left breasts.  She denies pain and fatigue.  The skin on her mid chest area has slight hyperpigmentation and she reports that it is "scratchy."  She is using radiaplex gel twice a day.

## 2014-06-21 NOTE — Progress Notes (Signed)
Weekly Management Note Current Dose: 7.2  Gy  Projected Dose: 60.4 Gy   Narrative:  The patient presents for routine under treatment assessment.  CBCT/MVCT images/Port film x-rays were reviewed.  The chart was checked. Doing well. No complaints. Anxious about lung and heart damage as well as skin irritation. Using radiaplex.   Physical Findings: Weight: 155 lb 6.4 oz (70.489 kg). Unchanged  Impression:  The patient is tolerating radiation.  Plan:  Continue treatment as planned.Discussed 3D planning. Discussed skin toxicity.

## 2014-06-22 ENCOUNTER — Encounter: Payer: Self-pay | Admitting: Gynecology

## 2014-06-22 ENCOUNTER — Ambulatory Visit (INDEPENDENT_AMBULATORY_CARE_PROVIDER_SITE_OTHER): Payer: 59 | Admitting: Gynecology

## 2014-06-22 ENCOUNTER — Ambulatory Visit
Admission: RE | Admit: 2014-06-22 | Discharge: 2014-06-22 | Disposition: A | Discharge status: Home / Self Care | Payer: 59 | Source: Ambulatory Visit | Attending: Radiation Oncology | Admitting: Radiation Oncology

## 2014-06-22 ENCOUNTER — Ambulatory Visit (INDEPENDENT_AMBULATORY_CARE_PROVIDER_SITE_OTHER): Payer: 59

## 2014-06-22 ENCOUNTER — Other Ambulatory Visit: Payer: Self-pay | Admitting: Gynecology

## 2014-06-22 DIAGNOSIS — C50919 Malignant neoplasm of unspecified site of unspecified female breast: Secondary | ICD-10-CM

## 2014-06-22 DIAGNOSIS — N83339 Acquired atrophy of ovary and fallopian tube, unspecified side: Secondary | ICD-10-CM

## 2014-06-22 DIAGNOSIS — Z510 Encounter for antineoplastic radiation therapy: Secondary | ICD-10-CM | POA: Diagnosis not present

## 2014-06-22 NOTE — Patient Instructions (Signed)
Follow up if you ultimately decide that you want to pursue removing the ovaries and fallopian tubes.

## 2014-06-22 NOTE — Progress Notes (Signed)
Valerie Heath 01-17-1951 [id number]        63 y.o.  G0P0 Presents for surveillance ultrasound. Patient has a history of cancer. Her mother has a history of breast cancer and fallopian tube cancer. This was discovered incidentally when she had a hysterectomy performed due to a right or bleeding in her 62s. Patient is scheduling an appointment for genetic counseling and testing.  Past medical history,surgical history, problem list, medications, allergies, family history and social history were all reviewed and documented in the EPIC chart.  Directed ROS with pertinent positives and negatives documented in the history of present illness/assessment and plan.  Ultrasound shows uterus normal size and echotexture. Endometrial echo 1.3 mm. Right left ovaries visualized and postmenopausal. Cul-de-sac negative.   Assessment/Plan:  63 y.o. 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 Is in the process of arranging a genetic counseling appointment as per her oncologist recommended. Ultrasound is negative. If gene positive the patient knows the need to pursue prophylactic sucking oophorectomy. If gene negative options to pursue prophylactic salpingo-oophorectomy discussed and offered versus observation. Patient recognizes not all gene mutations will be tested for. Patient will readdress after she has genetic counseling and testing.     Anastasio Auerbach MD, 12:41 PM 06/22/2014

## 2014-06-23 ENCOUNTER — Ambulatory Visit
Admission: RE | Admit: 2014-06-23 | Discharge: 2014-06-23 | Disposition: A | Discharge status: Home / Self Care | Payer: 59 | Source: Ambulatory Visit | Attending: Radiation Oncology | Admitting: Radiation Oncology

## 2014-06-23 DIAGNOSIS — C50412 Malignant neoplasm of upper-outer quadrant of left female breast: Secondary | ICD-10-CM | POA: Insufficient documentation

## 2014-06-23 DIAGNOSIS — C50411 Malignant neoplasm of upper-outer quadrant of right female breast: Secondary | ICD-10-CM | POA: Diagnosis not present

## 2014-06-23 DIAGNOSIS — Z510 Encounter for antineoplastic radiation therapy: Secondary | ICD-10-CM | POA: Insufficient documentation

## 2014-06-23 DIAGNOSIS — L819 Disorder of pigmentation, unspecified: Secondary | ICD-10-CM | POA: Diagnosis not present

## 2014-06-23 DIAGNOSIS — R5383 Other fatigue: Secondary | ICD-10-CM | POA: Insufficient documentation

## 2014-06-24 ENCOUNTER — Ambulatory Visit
Admission: RE | Admit: 2014-06-24 | Discharge: 2014-06-24 | Disposition: A | Discharge status: Home / Self Care | Payer: 59 | Source: Ambulatory Visit | Attending: Radiation Oncology | Admitting: Radiation Oncology

## 2014-06-24 DIAGNOSIS — Z510 Encounter for antineoplastic radiation therapy: Secondary | ICD-10-CM | POA: Diagnosis not present

## 2014-06-27 ENCOUNTER — Telehealth: Payer: Self-pay

## 2014-06-27 ENCOUNTER — Ambulatory Visit
Admission: RE | Admit: 2014-06-27 | Discharge: 2014-06-27 | Disposition: A | Discharge status: Home / Self Care | Payer: 59 | Source: Ambulatory Visit | Attending: Radiation Oncology | Admitting: Radiation Oncology

## 2014-06-27 DIAGNOSIS — Z510 Encounter for antineoplastic radiation therapy: Secondary | ICD-10-CM | POA: Diagnosis not present

## 2014-06-27 NOTE — Telephone Encounter (Signed)
Pt called in 10/1 inquiring re: genetic testing.  Inbskt sent VG.   Per his instructions today, inbskt sent to [facility] to start genetic testing process.

## 2014-06-28 ENCOUNTER — Ambulatory Visit
Admission: RE | Admit: 2014-06-28 | Discharge: 2014-06-28 | Disposition: A | Discharge status: Home / Self Care | Payer: 59 | Source: Ambulatory Visit | Attending: Radiation Oncology | Admitting: Radiation Oncology

## 2014-06-28 ENCOUNTER — Telehealth: Payer: Self-pay | Admitting: *Deleted

## 2014-06-28 ENCOUNTER — Encounter: Payer: Self-pay | Admitting: Radiation Oncology

## 2014-06-28 VITALS — BP 128/67 | HR 76 | Temp 97.6°F | Resp 12 | Ht 64.0 in | Wt 153.3 lb

## 2014-06-28 DIAGNOSIS — C50411 Malignant neoplasm of upper-outer quadrant of right female breast: Secondary | ICD-10-CM

## 2014-06-28 DIAGNOSIS — Z510 Encounter for antineoplastic radiation therapy: Secondary | ICD-10-CM | POA: Diagnosis not present

## 2014-06-28 DIAGNOSIS — C50412 Malignant neoplasm of upper-outer quadrant of left female breast: Secondary | ICD-10-CM

## 2014-06-28 NOTE — Progress Notes (Signed)
Valerie Heath has completed 9 fractions to her left and right breasts.  She denies pain.  She did notice fatigue after exercising today.  The skin on her chest is pink.  She is using radiaplex gel.

## 2014-06-28 NOTE — Telephone Encounter (Signed)
Received request from [place] to schedule pt for genetics.  Called pt and left a message for her to return my call so I can schedule her.

## 2014-06-28 NOTE — Progress Notes (Signed)
  Radiation Oncology         [phone] ________________________________  Name: Alexis Reber MRN: [id number]  Date: 06/28/2014  DOB: Feb 02, 1951  Weekly Radiation Therapy Management  DIAGNOSIS : Intraductal carcinoma of the right breast, stage I invasive ductal carcinoma of the left breast (T1c, N0, Mx)   Current Dose: 16.2 Gy     Planned Dose:  60.4 Gy  Narrative . . . . . . . . The patient presents for routine under treatment assessment.                                   The patient is without complaint except for some mild fatigue after exercising                                 Set-up films were reviewed.                                 The chart was checked. Physical Findings. . .  height is 5\' 4"  (1.626 m) and weight is 153 lb 4.8 oz (69.536 kg). Her oral temperature is 97.6 F (36.4 C). Her blood pressure is 128/67 and her pulse is 76. Her respiration is 12. . Mild erythema to both breast areas. The lungs are clear. The heart has a regular rhythm and rate. Impression . . . . . . . The patient is tolerating radiation. Plan . . . . . . . . . . . . Continue treatment as planned.  ________________________________   Blair Promise, PhD, MD

## 2014-06-28 NOTE — Telephone Encounter (Signed)
Pt returned my call and I confirmed 06/30/14 appt w/ pt.  Gave her instructions of what to do when she is done with Radiation.

## 2014-06-29 ENCOUNTER — Ambulatory Visit
Admission: RE | Admit: 2014-06-29 | Discharge: 2014-06-29 | Disposition: A | Discharge status: Home / Self Care | Payer: 59 | Source: Ambulatory Visit | Attending: Radiation Oncology | Admitting: Radiation Oncology

## 2014-06-29 DIAGNOSIS — Z510 Encounter for antineoplastic radiation therapy: Secondary | ICD-10-CM | POA: Diagnosis not present

## 2014-06-30 ENCOUNTER — Encounter (INDEPENDENT_AMBULATORY_CARE_PROVIDER_SITE_OTHER): Payer: PRIVATE HEALTH INSURANCE | Admitting: General Surgery

## 2014-06-30 ENCOUNTER — Ambulatory Visit (HOSPITAL_BASED_OUTPATIENT_CLINIC_OR_DEPARTMENT_OTHER): Payer: 59 | Admitting: Genetic Counselor

## 2014-06-30 ENCOUNTER — Other Ambulatory Visit: Payer: 59

## 2014-06-30 ENCOUNTER — Ambulatory Visit
Admission: RE | Admit: 2014-06-30 | Discharge: 2014-06-30 | Disposition: A | Discharge status: Home / Self Care | Payer: 59 | Source: Ambulatory Visit | Attending: Radiation Oncology | Admitting: Radiation Oncology

## 2014-06-30 DIAGNOSIS — C50919 Malignant neoplasm of unspecified site of unspecified female breast: Secondary | ICD-10-CM

## 2014-06-30 DIAGNOSIS — Z510 Encounter for antineoplastic radiation therapy: Secondary | ICD-10-CM | POA: Diagnosis not present

## 2014-06-30 DIAGNOSIS — Z803 Family history of malignant neoplasm of breast: Secondary | ICD-10-CM

## 2014-06-30 DIAGNOSIS — Z808 Family history of malignant neoplasm of other organs or systems: Secondary | ICD-10-CM

## 2014-06-30 DIAGNOSIS — C50412 Malignant neoplasm of upper-outer quadrant of left female breast: Secondary | ICD-10-CM

## 2014-06-30 DIAGNOSIS — Z315 Encounter for genetic counseling: Secondary | ICD-10-CM

## 2014-06-30 DIAGNOSIS — C7970 Secondary malignant neoplasm of unspecified adrenal gland: Principal | ICD-10-CM

## 2014-07-01 ENCOUNTER — Encounter: Payer: Self-pay | Admitting: Genetic Counselor

## 2014-07-01 ENCOUNTER — Ambulatory Visit
Admission: RE | Admit: 2014-07-01 | Discharge: 2014-07-01 | Disposition: A | Discharge status: Home / Self Care | Payer: 59 | Source: Ambulatory Visit | Attending: Radiation Oncology | Admitting: Radiation Oncology

## 2014-07-01 DIAGNOSIS — Z510 Encounter for antineoplastic radiation therapy: Secondary | ICD-10-CM | POA: Diagnosis not present

## 2014-07-01 NOTE — Progress Notes (Signed)
HISTORY OF PRESENT ILLNESS: Dr. Brigitte Pulse requested a cancer genetics consultation for Valerie Heath, a 63 y.o. female, due to a personal and family history of cancer.  Valerie Heath presents to clinic today to discuss the possibility of a hereditary predisposition to cancer, genetic testing, and to further clarify her future cancer risks, as well as potential cancer risk for family members.    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   Breast cancer of upper-outer quadrant of right female breast 02/2014    Past Surgical History  Procedure Laterality Date   Tonsillectomy and adenoidectomy     Myomectomy     Shoulder surgery  2005    left   Breast lumpectomy with radioactive seed localization Left 02/21/2014    Procedure: BREAST LUMPECTOMY WITH RADIOACTIVE SEED LOCALIZATION;  Surgeon: Adin Hector, MD;  Location: [facility];  Service: General;  Laterality: Left;   Colonoscopy     Lymph node dissection  04/27/14    right and left   Breast surgery Right 04/2014    Lumpectomy    History   Social History   Marital Status: Married    Spouse Name: N/A    Number of Children: 0   Years of Education: N/A   Occupational History   retired - Sales executive for [employer] History Main Topics   Smoking status: Never Smoker    Smokeless tobacco: Never Used   Alcohol Use: 1.0 oz/week    2 drink(s) per week     Comment: Socially   Drug Use: No   Sexual Activity: Yes    Patent examiner Protection: Post-menopausal   Other Topics Concern   Not on file   Social History Narrative   No narrative on file     FAMILY HISTORY:  During the visit, a 4-generation pedigree was obtained. Significant diagnoses include the following:  Family History  Problem Relation Age of Onset   Hypertension Mother    Breast cancer Mother 60    Cancer Mother 93    breast cancer at 57 ; Fallopian tube tumor in her 67s   Diabetes Father    Hypertension Father    Heart disease Father    Prostate cancer Father    Cancer Father 28    prostate cancer   Colon cancer Paternal Uncle    Brain cancer Paternal Uncle    Cancer Paternal Uncle 66    colon cancer   Throat cancer Maternal Uncle    Cancer Maternal Uncle 45    prostate cancer   Prostate cancer Maternal Uncle    Breast cancer Maternal Aunt    Cancer Maternal Aunt 45    breast cancer   Leukemia Paternal Grandfather    Cancer Paternal Grandfather 27    leukemia   Leukemia Maternal Aunt    Cancer Maternal Aunt 90    leukemia   Cancer Paternal Uncle 61    brain cancer    Valerie Heath's ancestry is of Caucasian descent. There is no known Jewish ancestry or consanguinity.  GENETIC COUNSELING ASSESSMENT: Valerie Heath is a 63 y.o. female with a personal and family history of cancer suggestive of a hereditary predisposition to cancer. We, therefore, discussed and recommended the following at today's visit.   DISCUSSION: We reviewed the characteristics, features and inheritance patterns  of hereditary cancer syndromes. We also discussed genetic testing, including the appropriate family members to test, the process of testing, insurance coverage and turn-around-time for results. We discussed the implications of a negative, positive and/or variant of uncertain significant result. We recommended Valerie Heath pursue genetic testing for the [employer] gene panel.   PLAN: Based on our above recommendation, Valerie Heath wished to pursue genetic testing and the blood sample was drawn and will be sent to [facility] for analysis. Results should be available within approximately 5 weeks time, at which point they will be disclosed by telephone to Valerie Heath, as will any additional recommendations warranted by these results. We also encouraged Valerie Heath to remain in contact  with cancer genetics annually so that we can continuously update the family history and inform her of any changes in cancer genetics and testing that may be of benefit for this family. Ms.  Heath questions were answered to her satisfaction today. Our contact information was provided should additional questions or concerns arise.   Thank you for the referral and allowing [place] to share in the care of your patient.   The patient was seen for a total of 40 minutes in face-to-face genetic counseling.  This patient was discussed with Dr. Jana Hakim who agrees with the above.    _______________________________________________________________________ For Office Staff:  Number of people involved in session: 3 Was an Intern/ student involved with case: not applicable

## 2014-07-04 ENCOUNTER — Ambulatory Visit
Admission: RE | Admit: 2014-07-04 | Discharge: 2014-07-04 | Disposition: A | Discharge status: Home / Self Care | Payer: 59 | Source: Ambulatory Visit | Attending: Radiation Oncology | Admitting: Radiation Oncology

## 2014-07-04 DIAGNOSIS — Z510 Encounter for antineoplastic radiation therapy: Secondary | ICD-10-CM | POA: Diagnosis not present

## 2014-07-05 ENCOUNTER — Encounter: Payer: Self-pay | Admitting: Radiation Oncology

## 2014-07-05 ENCOUNTER — Ambulatory Visit
Admission: RE | Admit: 2014-07-05 | Discharge: 2014-07-05 | Disposition: A | Discharge status: Home / Self Care | Payer: 59 | Source: Ambulatory Visit | Attending: Radiation Oncology | Admitting: Radiation Oncology

## 2014-07-05 VITALS — BP 138/73 | HR 88 | Temp 98.4°F | Resp 20 | Wt 154.8 lb

## 2014-07-05 DIAGNOSIS — Z510 Encounter for antineoplastic radiation therapy: Secondary | ICD-10-CM | POA: Diagnosis not present

## 2014-07-05 DIAGNOSIS — C50411 Malignant neoplasm of upper-outer quadrant of right female breast: Secondary | ICD-10-CM | POA: Diagnosis not present

## 2014-07-05 MED ORDER — RADIAPLEXRX EX GEL
Freq: Once | CUTANEOUS | Status: AC
  Administered 2014-07-05: 14:00:00 via TOPICAL

## 2014-07-05 NOTE — Progress Notes (Signed)
  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 . . . . . . . The patient presents for routine under treatment assessment.                                   The patient is without complaint except for some mild itching in the central chest region. She has noticed some mild fatigue in addition.                                 Set-up films were reviewed.                                 The chart was checked. Physical Findings. . .  weight is 154 lb 12.8 oz (70.217 kg). Her oral temperature is 98.4 F (36.9 C). Her blood pressure is 138/73 and her pulse is 88. Her respiration is 20. .  The chest area shows some erythema in the upper central chest region. No signs of infection. Impression . . . . . . . The patient is tolerating radiation. Plan . . . . . . . . . . . . Continue treatment as planned.  ________________________________   Blair Promise, PhD, MD

## 2014-07-05 NOTE — Progress Notes (Signed)
Weekly rad txs rt breast, 14 th treatment, erythema ,skin intact, using radiaplex bid, mid chest starting to itch, is getting tired some, was nauseated last night and this am,  2:13 PM

## 2014-07-06 ENCOUNTER — Ambulatory Visit
Admission: RE | Admit: 2014-07-06 | Discharge: 2014-07-06 | Disposition: A | Discharge status: Home / Self Care | Payer: 59 | Source: Ambulatory Visit | Attending: Radiation Oncology | Admitting: Radiation Oncology

## 2014-07-06 DIAGNOSIS — Z510 Encounter for antineoplastic radiation therapy: Secondary | ICD-10-CM | POA: Diagnosis not present

## 2014-07-07 ENCOUNTER — Ambulatory Visit
Admission: RE | Admit: 2014-07-07 | Discharge: 2014-07-07 | Disposition: A | Discharge status: Home / Self Care | Payer: 59 | Source: Ambulatory Visit | Attending: Radiation Oncology | Admitting: Radiation Oncology

## 2014-07-07 DIAGNOSIS — Z510 Encounter for antineoplastic radiation therapy: Secondary | ICD-10-CM | POA: Diagnosis not present

## 2014-07-08 ENCOUNTER — Ambulatory Visit
Admission: RE | Admit: 2014-07-08 | Discharge: 2014-07-08 | Disposition: A | Discharge status: Home / Self Care | Payer: 59 | Source: Ambulatory Visit | Attending: Radiation Oncology | Admitting: Radiation Oncology

## 2014-07-08 DIAGNOSIS — Z510 Encounter for antineoplastic radiation therapy: Secondary | ICD-10-CM | POA: Diagnosis not present

## 2014-07-11 ENCOUNTER — Ambulatory Visit
Admission: RE | Admit: 2014-07-11 | Discharge: 2014-07-11 | Disposition: A | Discharge status: Home / Self Care | Payer: 59 | Source: Ambulatory Visit | Attending: Radiation Oncology | Admitting: Radiation Oncology

## 2014-07-11 DIAGNOSIS — Z510 Encounter for antineoplastic radiation therapy: Secondary | ICD-10-CM | POA: Diagnosis not present

## 2014-07-12 ENCOUNTER — Encounter: Payer: Self-pay | Admitting: Radiation Oncology

## 2014-07-12 ENCOUNTER — Ambulatory Visit
Admission: RE | Admit: 2014-07-12 | Discharge: 2014-07-12 | Disposition: A | Discharge status: Home / Self Care | Payer: 59 | Source: Ambulatory Visit | Attending: Radiation Oncology | Admitting: Radiation Oncology

## 2014-07-12 VITALS — BP 131/80 | HR 73 | Temp 98.0°F | Resp 16 | Ht 64.0 in | Wt 154.2 lb

## 2014-07-12 DIAGNOSIS — C50411 Malignant neoplasm of upper-outer quadrant of right female breast: Secondary | ICD-10-CM

## 2014-07-12 DIAGNOSIS — Z510 Encounter for antineoplastic radiation therapy: Secondary | ICD-10-CM | POA: Diagnosis not present

## 2014-07-12 DIAGNOSIS — C50412 Malignant neoplasm of upper-outer quadrant of left female breast: Secondary | ICD-10-CM

## 2014-07-12 NOTE — Progress Notes (Signed)
  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 . . . . . . . The patient presents for routine under treatment assessment.                                   The patient is without complaint except for some sharp shooting pains within both breasts. She also reports some fatigue. She has had some mild nausea which is controlled with food at this time. She is not wish anything stronger for this issue.                                 Set-up films were reviewed.                                 The chart was checked. Physical Findings. . .  height is 5\' 4"  (1.626 m) and weight is 154 lb 3.2 oz (69.945 kg). Her oral temperature is 98 F (36.7 C). Her blood pressure is 131/80 and her pulse is 73. Her respiration is 16. . The lungs are clear. The heart has a regular rhythm and rate. Examination of both breast areas shows some hyperpigmentation changes and erythema in the upper inner aspect of both breasts.  Impression . . . . . . . The patient is tolerating radiation. Plan . . . . . . . . . . . . Continue treatment as planned.  ________________________________   Blair Promise, PhD, MD

## 2014-07-12 NOTE — Progress Notes (Signed)
Valerie Heath has completed 19 fractions to her right and left breast.  She denies pain except for occasional sharp pains in both breasts.  She has also noticed that she is having extra heart beats.  This started last week.  She reports having nausea that started a few weeks ago.  She reports fatigue.  The skin on both of her breasts is pink.  She is using radiaplex.

## 2014-07-13 ENCOUNTER — Ambulatory Visit
Admission: RE | Admit: 2014-07-13 | Discharge: 2014-07-13 | Disposition: A | Discharge status: Home / Self Care | Payer: 59 | Source: Ambulatory Visit | Attending: Radiation Oncology | Admitting: Radiation Oncology

## 2014-07-13 DIAGNOSIS — Z510 Encounter for antineoplastic radiation therapy: Secondary | ICD-10-CM | POA: Diagnosis not present

## 2014-07-14 ENCOUNTER — Ambulatory Visit
Admission: RE | Admit: 2014-07-14 | Discharge: 2014-07-14 | Disposition: A | Discharge status: Home / Self Care | Payer: 59 | Source: Ambulatory Visit | Attending: Radiation Oncology | Admitting: Radiation Oncology

## 2014-07-14 DIAGNOSIS — Z510 Encounter for antineoplastic radiation therapy: Secondary | ICD-10-CM | POA: Diagnosis not present

## 2014-07-15 ENCOUNTER — Ambulatory Visit
Admission: RE | Admit: 2014-07-15 | Discharge: 2014-07-15 | Disposition: A | Discharge status: Home / Self Care | Payer: 59 | Source: Ambulatory Visit | Attending: Radiation Oncology | Admitting: Radiation Oncology

## 2014-07-15 DIAGNOSIS — Z510 Encounter for antineoplastic radiation therapy: Secondary | ICD-10-CM | POA: Diagnosis not present

## 2014-07-18 ENCOUNTER — Ambulatory Visit
Admission: RE | Admit: 2014-07-18 | Discharge: 2014-07-18 | Disposition: A | Discharge status: Home / Self Care | Payer: 59 | Source: Ambulatory Visit | Attending: Radiation Oncology | Admitting: Radiation Oncology

## 2014-07-18 DIAGNOSIS — Z510 Encounter for antineoplastic radiation therapy: Secondary | ICD-10-CM | POA: Diagnosis not present

## 2014-07-19 ENCOUNTER — Ambulatory Visit: Admission: RE | Admit: 2014-07-19 | Payer: 59 | Source: Ambulatory Visit | Admitting: Radiation Oncology

## 2014-07-19 ENCOUNTER — Encounter: Payer: Self-pay | Admitting: Radiation Oncology

## 2014-07-19 ENCOUNTER — Ambulatory Visit
Admission: RE | Admit: 2014-07-19 | Discharge: 2014-07-19 | Disposition: A | Discharge status: Home / Self Care | Payer: 59 | Source: Ambulatory Visit | Attending: Radiation Oncology | Admitting: Radiation Oncology

## 2014-07-19 ENCOUNTER — Inpatient Hospital Stay
Admission: RE | Admit: 2014-07-19 | Discharge: 2014-07-19 | Disposition: A | Discharge status: Home / Self Care | Payer: 59 | Source: Ambulatory Visit | Attending: Radiation Oncology | Admitting: Radiation Oncology

## 2014-07-19 VITALS — BP 148/79 | HR 75 | Temp 97.7°F | Resp 16 | Ht 64.0 in | Wt 156.5 lb

## 2014-07-19 DIAGNOSIS — C50411 Malignant neoplasm of upper-outer quadrant of right female breast: Secondary | ICD-10-CM

## 2014-07-19 DIAGNOSIS — Z510 Encounter for antineoplastic radiation therapy: Secondary | ICD-10-CM | POA: Diagnosis not present

## 2014-07-19 MED ORDER — BIAFINE EX EMUL
Freq: Once | CUTANEOUS | Status: AC
  Administered 2014-07-19: 16:00:00 via TOPICAL

## 2014-07-19 MED ORDER — PROCHLORPERAZINE MALEATE 10 MG PO TABS: 10.0000 mg | ORAL_TABLET | Freq: Four times a day (QID) | ORAL | Status: DC | PRN

## 2014-07-19 NOTE — Progress Notes (Signed)
   Department of Radiation Oncology  Phone:  [phone] Fax:        [phone]  Electron beam simulation note  Today the patient underwent additional planning for radiation therapy directed at the left and right breast area. Patient's treatment planning CT scan was reviewed and she had set up of 2 custom electron cutout fields, one of which is directed at the lumpectomy  within the right breast and the second directed at the lumpectomy cavity within the left breast. The patient will be treated with a custom electron cutout field for both areas. [employer] were generated for both areas of treatment (complex isodose plans) . The right breast will be treated with 15 megavoltage electrons. The left breast will be treated with 12 megavoltage electrons.  A special port plan is requested for treatment.  Blair Promise, MD

## 2014-07-19 NOTE — Addendum Note (Signed)
Encounter addended by: Jacqulyn Liner, RN on: 07/19/2014  3:38 PM<BR>     Documentation filed: Inpatient MAR

## 2014-07-19 NOTE — Progress Notes (Signed)
  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 . . . . . . . The patient presents for routine under treatment assessment.                                   The patient these have problems with nausea. A prescription for Compazine has been sent to her pharmacy.  She is having some itching in the upper aspect of both breasts. Patient will be switched to Biafine.                                 Set-up films were reviewed.                                 The chart was checked. Physical Findings. . .  height is 5\' 4"  (1.626 m) and weight is 156 lb 8 oz (70.988 kg). Her oral temperature is 97.7 F (36.5 C). Her blood pressure is 148/79 and her pulse is 75. Her respiration is 16. . The lungs are clear. The heart has a regular rhythm and rate. 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 . . . . . . The patient is tolerating radiation. Plan . . . . . . . . . . . . Continue treatment as planned.  ________________________________   Blair Promise, PhD, MD

## 2014-07-19 NOTE — Addendum Note (Signed)
Encounter addended by: Jacqulyn Liner, RN on: 07/19/2014  2:03 PM<BR>     Documentation filed: Orders

## 2014-07-19 NOTE — Progress Notes (Signed)
Valerie Heath has completed 24 fractions to her bilateral breasts.  She denies pain.  She reports nausea with dry heaves in the mornings and would like medication for this.  She reports fatigue.  The skin on both of her breasts is red.  The skin on her mid chest has a raised rash that she said is itchy.  She is using radiaplex twice a day.

## 2014-07-20 ENCOUNTER — Ambulatory Visit: Payer: 59 | Admitting: Radiation Oncology

## 2014-07-20 ENCOUNTER — Ambulatory Visit
Admission: RE | Admit: 2014-07-20 | Discharge: 2014-07-20 | Disposition: A | Discharge status: Home / Self Care | Payer: 59 | Source: Ambulatory Visit | Attending: Radiation Oncology | Admitting: Radiation Oncology

## 2014-07-20 DIAGNOSIS — Z510 Encounter for antineoplastic radiation therapy: Secondary | ICD-10-CM | POA: Diagnosis not present

## 2014-07-21 ENCOUNTER — Ambulatory Visit
Admission: RE | Admit: 2014-07-21 | Discharge: 2014-07-21 | Disposition: A | Discharge status: Home / Self Care | Payer: 59 | Source: Ambulatory Visit | Attending: Radiation Oncology | Admitting: Radiation Oncology

## 2014-07-21 DIAGNOSIS — Z510 Encounter for antineoplastic radiation therapy: Secondary | ICD-10-CM | POA: Diagnosis not present

## 2014-07-22 ENCOUNTER — Ambulatory Visit
Admission: RE | Admit: 2014-07-22 | Discharge: 2014-07-22 | Disposition: A | Discharge status: Home / Self Care | Payer: 59 | Source: Ambulatory Visit | Attending: Radiation Oncology | Admitting: Radiation Oncology

## 2014-07-22 DIAGNOSIS — Z510 Encounter for antineoplastic radiation therapy: Secondary | ICD-10-CM | POA: Diagnosis not present

## 2014-07-25 ENCOUNTER — Ambulatory Visit
Admission: RE | Admit: 2014-07-25 | Discharge: 2014-07-25 | Disposition: A | Discharge status: Home / Self Care | Payer: 59 | Source: Ambulatory Visit | Attending: Radiation Oncology | Admitting: Radiation Oncology

## 2014-07-25 DIAGNOSIS — Z510 Encounter for antineoplastic radiation therapy: Secondary | ICD-10-CM | POA: Diagnosis not present

## 2014-07-26 ENCOUNTER — Encounter: Payer: Self-pay | Admitting: Genetic Counselor

## 2014-07-26 ENCOUNTER — Ambulatory Visit
Admission: RE | Admit: 2014-07-26 | Discharge: 2014-07-26 | Disposition: A | Discharge status: Home / Self Care | Payer: 59 | Source: Ambulatory Visit | Attending: Radiation Oncology | Admitting: Radiation Oncology

## 2014-07-26 ENCOUNTER — Encounter: Payer: Self-pay | Admitting: Radiation Oncology

## 2014-07-26 VITALS — BP 130/75 | HR 80 | Temp 97.9°F | Resp 16 | Ht 64.0 in | Wt 155.6 lb

## 2014-07-26 DIAGNOSIS — C50912 Malignant neoplasm of unspecified site of left female breast: Secondary | ICD-10-CM | POA: Insufficient documentation

## 2014-07-26 DIAGNOSIS — C50411 Malignant neoplasm of upper-outer quadrant of right female breast: Secondary | ICD-10-CM

## 2014-07-26 DIAGNOSIS — C50911 Malignant neoplasm of unspecified site of right female breast: Secondary | ICD-10-CM | POA: Insufficient documentation

## 2014-07-26 DIAGNOSIS — Z510 Encounter for antineoplastic radiation therapy: Secondary | ICD-10-CM | POA: Insufficient documentation

## 2014-07-26 DIAGNOSIS — C50412 Malignant neoplasm of upper-outer quadrant of left female breast: Secondary | ICD-10-CM

## 2014-07-26 MED ORDER — RADIAPLEXRX EX GEL
Freq: Once | CUTANEOUS | Status: AC
  Administered 2014-07-26: 16:00:00 via TOPICAL

## 2014-07-26 NOTE — Progress Notes (Signed)
  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 . . . . . . . The patient presents for routine under treatment assessment.                                   The patient is having some fatigue. She denies any more nausea with taking Compazine                                 Set-up films were reviewed.                                 The chart was checked. Physical Findings. . The lungs are clear. The heart has regular rhythm and rate. The left breast shows some hyperpigmentation changes and mild erythema. The right breast shows some erythema and hyperpigmentation changes. Impression . . . . . . . The patient is tolerating radiation. Plan . . . . . . . . . . . . Continue treatment as planned.  ________________________________   Blair Promise, PhD, MD

## 2014-07-26 NOTE — Progress Notes (Signed)
Valerie Heath has completed 29 fractions to both of her breasts.  She denies pain.  She reports fatigue.  Her nausea has improved with taking compazine.  The skin on her breasts is red without any peeling.  She is using radiaplex and biafine underneath her breasts.  She is requesting a refill on radiaplex.  Another tube has been given.

## 2014-07-26 NOTE — Progress Notes (Signed)
  Radiation Oncology         [phone] ________________________________  Name: Valerie Heath MRN: [id number]  Date: 07/26/2014  DOB: 1950/12/24  Electron beam setup  check  The patient was taken to the treatment room and placed in the treatment position. She had set up of both of her custom electron cutout fields. These showed accurate placement for treatment.   ________________________________   Blair Promise, PhD, MD

## 2014-07-26 NOTE — Progress Notes (Signed)
HPI:  Ms. Warda was previously seen in the [facility] clinic due to a personal and family history of cancer and concerns regarding a hereditary predisposition to cancer. Please refer to our prior cancer genetics clinic note for more information regarding Ms. Bevins's medical, social and family histories, and our assessment and recommendations, at the time. Ms. Croak recent genetic test results were disclosed to her, as were recommendations warranted by these results. These results and recommendations are discussed in more detail below.  GENETIC TEST RESULTS: At the time of Ms. Romanski's visit, we recommended she pursue genetic testing of the OvaNext gene panel. This test, which included sequencing and deletion/duplication analysis of the genes, was performed at [facility]. Genetic testing was normal, and did not reveal a deleterious mutation in these genes. A complete list of all genes tested is located on the test report scanned into EPIC.    We discussed with Ms. Celia that since the current genetic testing is not perfect, it is possible there may be a gene mutation in one of these genes that current testing cannot detect, but that chance is small.  We also discussed, that it is possible that another gene that has not yet been discovered, or that we have not yet tested, is responsible for the cancer diagnoses in the family, and it is, therefore, important to remain in touch with cancer genetics in the future so that we can continue to offer Ms. Twining the most up to date genetic testing.   CANCER SCREENING RECOMMENDATIONS: This result is generally reassuring and suggests that Ms. Kauffmann's cancer was most likely not due to an inherited predisposition associated with one of these genes.  Most cancers happen by chance and this negative test suggests that her cancer falls into this category.  We, therefore, recommended she continue to follow the cancer management and  screening guidelines provided by her oncology and primary providers.   RECOMMENDATIONS FOR FAMILY MEMBERS:  While these results are reassuring for Ms. Guillot, this test does not tell us anything about Ms. Melland's maternal relatives' risks. We recommended these relatives also have genetic counseling and testing. Please let us know if we can help facilitate testing. Genetic counselors can be located in other cities, by visiting the website of the [employer] ([employer]se) and Field seismologist for a Dietitian by zip code.  FOLLOW-UP: Lastly, we discussed with Ms. Sheer that cancer genetics is a rapidly advancing field and it is possible that new genetic tests will be appropriate for her and/or her family members in the future. We encouraged her to remain in contact with cancer genetics on an annual basis so we can update her personal and family histories and let her know of advances in cancer genetics that may benefit this family.   Our contact number was provided. Ms. Ezekiel questions were answered to her satisfaction, and she knows she is welcome to call us at anytime with additional questions or concerns.   Catherine A. Fine, MS, CGC Certified Psychologist, [email or web address]

## 2014-07-27 ENCOUNTER — Ambulatory Visit
Admission: RE | Admit: 2014-07-27 | Discharge: 2014-07-27 | Disposition: A | Discharge status: Home / Self Care | Payer: 59 | Source: Ambulatory Visit | Attending: Radiation Oncology | Admitting: Radiation Oncology

## 2014-07-27 DIAGNOSIS — Z510 Encounter for antineoplastic radiation therapy: Secondary | ICD-10-CM | POA: Diagnosis not present

## 2014-07-28 ENCOUNTER — Ambulatory Visit
Admission: RE | Admit: 2014-07-28 | Discharge: 2014-07-28 | Disposition: A | Discharge status: Home / Self Care | Payer: 59 | Source: Ambulatory Visit | Attending: Radiation Oncology | Admitting: Radiation Oncology

## 2014-07-28 DIAGNOSIS — Z510 Encounter for antineoplastic radiation therapy: Secondary | ICD-10-CM | POA: Diagnosis not present

## 2014-07-29 ENCOUNTER — Ambulatory Visit
Admission: RE | Admit: 2014-07-29 | Discharge: 2014-07-29 | Disposition: A | Discharge status: Home / Self Care | Payer: 59 | Source: Ambulatory Visit | Attending: Radiation Oncology | Admitting: Radiation Oncology

## 2014-07-29 DIAGNOSIS — Z510 Encounter for antineoplastic radiation therapy: Secondary | ICD-10-CM | POA: Diagnosis not present

## 2014-08-01 ENCOUNTER — Ambulatory Visit
Admission: RE | Admit: 2014-08-01 | Discharge: 2014-08-01 | Disposition: A | Discharge status: Home / Self Care | Payer: 59 | Source: Ambulatory Visit | Attending: Radiation Oncology | Admitting: Radiation Oncology

## 2014-08-01 ENCOUNTER — Inpatient Hospital Stay
Admission: RE | Admit: 2014-08-01 | Discharge: 2014-08-01 | Disposition: A | Discharge status: Home / Self Care | Payer: 59 | Source: Ambulatory Visit | Attending: Radiation Oncology | Admitting: Radiation Oncology

## 2014-08-01 ENCOUNTER — Encounter: Payer: Self-pay | Admitting: Radiation Oncology

## 2014-08-01 VITALS — BP 119/72 | HR 84 | Temp 98.0°F | Resp 20 | Ht 64.0 in | Wt 156.5 lb

## 2014-08-01 DIAGNOSIS — C50412 Malignant neoplasm of upper-outer quadrant of left female breast: Secondary | ICD-10-CM

## 2014-08-01 DIAGNOSIS — C50411 Malignant neoplasm of upper-outer quadrant of right female breast: Secondary | ICD-10-CM

## 2014-08-01 DIAGNOSIS — Z510 Encounter for antineoplastic radiation therapy: Secondary | ICD-10-CM | POA: Diagnosis not present

## 2014-08-01 NOTE — Progress Notes (Signed)
Valerie Heath has completed treatment to both of her breasts.  She denies pain and fatigue.  The skin on both her breasts is red.  She has some peeling underneath her right arm.  She reports her skin itches.  She is using radiaplex and biafine twice a day.  She has an appointment with Dr. Dalbert Batman on Thursday.

## 2014-08-01 NOTE — Progress Notes (Signed)
  Radiation Oncology         [phone] ________________________________  Name: Valerie Heath  MRN: [id number]  Date: 08/01/2014  DOB: [birth year]  Weekly Radiation Therapy Management    ICD-9-CM ICD-10-CM  1. Breast cancer of upper-outer quadrant of left female breast 174.4 C50.412  2. Breast cancer of upper-outer quadrant of right female breast 174.4 C50.411    Current Dose: 60.4 Gy     Planned Dose:  60.4 Gy  Narrative . . . . . . . . The patient presents for routine under treatment assessment.                                   The patient is without complaint.  Valerie Heath has completed treatment to both of her breasts.  She denies pain and fatigue.  The skin on both her breasts is red.  She has some peeling underneath her right arm.  She reports her skin itches.  She is using radiaplex and biafine twice a day.  She has an appointment with Dr. Dalbert Batman on Thursday.                                 Set-up films were reviewed.                                 The chart was checked. Physical Findings. . .  height is 5\' 4"  (1.626 m) and weight is 156 lb 8 oz (70.988 kg). Her oral temperature is 98 F (36.7 C). Her blood pressure is 119/72 and her pulse is 84. Her respiration is 20. . Weight essentially stable.  No significant changes. Impression . . . . . . . The patient completes radiation today. Plan . . . . . . . . . . . . Follow-up in one month with Dr. Sondra Come.  ________________________________  Sheral Apley. Tammi Klippel, M.D.

## 2014-08-12 ENCOUNTER — Encounter: Payer: Self-pay | Admitting: Hematology and Oncology

## 2014-08-22 ENCOUNTER — Encounter: Payer: Self-pay | Admitting: Radiation Oncology

## 2014-08-22 NOTE — Progress Notes (Signed)
  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 The left lumpectomy cavity was boosted with 12 megavoltage electrons using a custom electron cutout  Narrative: The patient tolerated radiation treatment relatively well.   She developed some itching and discomfort in both breasts. She also developed some fatigue. No moist desquamation in either breast  Plan: The patient has completed radiation treatment. The patient will return to radiation oncology clinic for routine followup in one month. I advised them to call or return sooner if they have any questions or concerns related to their recovery or treatment.  -----------------------------------  Blair Promise, PhD, MD

## 2014-08-23 ENCOUNTER — Ambulatory Visit (HOSPITAL_BASED_OUTPATIENT_CLINIC_OR_DEPARTMENT_OTHER): Payer: 59 | Admitting: Hematology and Oncology

## 2014-08-23 ENCOUNTER — Telehealth: Payer: Self-pay | Admitting: Hematology and Oncology

## 2014-08-23 VITALS — BP 130/77 | HR 75 | Temp 98.1°F | Resp 18 | Ht 64.0 in | Wt 156.0 lb

## 2014-08-23 DIAGNOSIS — D0511 Intraductal carcinoma in situ of right breast: Secondary | ICD-10-CM

## 2014-08-23 DIAGNOSIS — C50412 Malignant neoplasm of upper-outer quadrant of left female breast: Secondary | ICD-10-CM

## 2014-08-23 DIAGNOSIS — Z170 Estrogen receptor positive status [ER+]: Secondary | ICD-10-CM

## 2014-08-23 DIAGNOSIS — C50411 Malignant neoplasm of upper-outer quadrant of right female breast: Secondary | ICD-10-CM

## 2014-08-23 MED ORDER — ANASTROZOLE 1 MG PO TABS: 1.0000 mg | ORAL_TABLET | Freq: Every day | ORAL | Status: DC

## 2014-08-23 NOTE — Progress Notes (Signed)
Patient Care Team: Marton Redwood, MD as PCP - General (Internal Medicine)  DIAGNOSIS: No matching staging information was found for the patient.  SUMMARY OF ONCOLOGIC HISTORY: Oncology History   Grams mammogram in Arimidex     Breast cancer of upper-outer quadrant of right female breast   03/29/2014 Initial Diagnosis MRI finding of suspicious changes   04/27/2014 Surgery Right breast lumpectomy: Fibrocystic changes; 5 sentinel lymph nodes negative    Breast cancer of upper-outer quadrant of left female breast   03/29/2014 Initial Diagnosis Breast cancer of upper-outer quadrant of left female breast   04/27/2014 Surgery Left breast lumpectomy: Invasive ductal carcinoma grade 1/3; 1.5 cm with DCIS, ER 100%, PR 53%, Ki-67 8%, HER-2 negative ratio 1.21   06/14/2014 - 08/01/2014 Radiation Therapy Adjuvant radiation therapy   08/02/2014 -  Anti-estrogen oral therapy Arimidex 1 mg daily X 5 years    CHIEF COMPLIANT: followup of breast cancer  INTERVAL HISTORY: Valerie Heath is a 63 year old Caucasian with above-mentioned 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 She did not need chemotherapy because she had low risk Oncotype DX. She is currently on Arimidex for a month and is tolerating it very well. She denies any hot flashes or muscle aches or pains.  REVIEW OF SYSTEMS:   Constitutional: Denies fevers, chills or abnormal weight loss Eyes: Denies blurriness of vision Ears, nose, mouth, throat, and face: Denies mucositis or sore throat Respiratory: Denies cough, dyspnea or wheezes Cardiovascular: Denies palpitation, chest discomfort or lower extremity swelling Gastrointestinal:  Denies nausea, heartburn or change in bowel habits Skin: Denies abnormal skin rashes Lymphatics: Denies new lymphadenopathy or easy bruising Neurological:Denies numbness, tingling or new weaknesses Behavioral/Psych: Mood is stable, no new  changes  Breast:  Denies any pain in the breast All other systems were reviewed with the patient and are negative.  I have reviewed the past medical history, past surgical history, social history and family history with the patient and they are unchanged from previous note.  ALLERGIES:  is allergic to erythromycin.  MEDICATIONS:  Current Outpatient Prescriptions  Medication Sig Dispense Refill  . anastrozole (ARIMIDEX) 1 MG tablet Take 1 tablet (1 mg total) by mouth daily. 90 tablet 3  . Calcium Carbonate-Vitamin D (CALCIUM + D PO) Take 1 capsule by mouth daily.     . Cholecalciferol (VITAMIN D PO) Take 1 capsule by mouth daily.     Marland Kitchen emollient (BIAFINE) cream Apply topically as needed.    . Flaxseed Oil OIL 1,400 mg by Does not apply route 3 (three) times a week.    Marland Kitchen glucosamine-chondroitin 500-400 MG tablet Take 1,875 tablets by mouth 5 (five) times daily.    Marland Kitchen levothyroxine (SYNTHROID, LEVOTHROID) 125 MCG tablet Take 125 mcg by mouth daily.      . Multiple Vitamin (MULTIVITAMIN) tablet Take 1 tablet by mouth daily.    . NON FORMULARY 300 mg. 5 times a week    . NON FORMULARY Cherry extract 1500 mg 5 times a week    . non-metallic deodorant (ALRA) MISC Apply 1 application topically daily as needed.    . Omega-3 Fatty Acids (FISH OIL PO) Take 1,000 mg by mouth 3 (three) times a week.     . prochlorperazine (COMPAZINE) 10 MG tablet Take 1 tablet (10 mg total) by mouth every 6 (six) hours as needed for nausea or vomiting. 30 tablet 0  . Wound Cleansers (RADIAPLEX EX) Apply topically.  No current facility-administered medications for this visit.    PHYSICAL EXAMINATION: ECOG PERFORMANCE STATUS: 1 - Symptomatic but completely ambulatory  Filed Vitals:   08/23/14 1108  BP: 130/77  Pulse: 75  Temp: 98.1 F (36.7 C)  Resp: 18   Filed Weights   08/23/14 1108  Weight: 156 lb (70.761 kg)    GENERAL:alert, no distress and comfortable SKIN: skin color, texture, turgor are  normal, no rashes or significant lesions EYES: normal, Conjunctiva are pink and non-injected, sclera clear OROPHARYNX:no exudate, no erythema and lips, buccal mucosa, and tongue normal  NECK: supple, thyroid normal size, non-tender, without nodularity LYMPH:  no palpable lymphadenopathy in the cervical, axillary or inguinal LUNGS: clear to auscultation and percussion with normal breathing effort HEART: regular rate & rhythm and no murmurs and no lower extremity edema ABDOMEN:abdomen soft, non-tender and normal bowel sounds Musculoskeletal:no cyanosis of digits and no clubbing  NEURO: alert & oriented x 3 with fluent speech, no focal motor/sensory deficits  LABORATORY DATA:  I have reviewed the data as listed   Chemistry   No results found for: NA, K, CL, CO2, BUN, CREATININE, GLU No results found for: CALCIUM, ALKPHOS, AST, ALT, BILITOT     Lab Results  Component Value Date   HGB 14.3 04/27/2014   ASSESSMENT & PLAN:  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 Oncotype DX recurrence score was 8 which translates into a 6% risk of distant recurrence with antiestrogen therapy alone  Aromatase inhibitor counseling: Patient is tolerating Arimidex extremely well without any major problems or concerns. She denies any hot flashes or muscle aches or pains. We'll continue to watch and monitor for side effects.  Return to clinic in 3 months for followup.  No orders of the defined types were placed in this encounter.   The patient has a good understanding of the overall plan. she agrees with it. She will call  with any problems that may develop before her next visit here.   Rulon Eisenmenger, MD 08/23/2014 11:29 AM

## 2014-08-23 NOTE — Telephone Encounter (Signed)
, °

## 2014-08-23 NOTE — Addendum Note (Signed)
Addended by: Prentiss Bells on: 08/23/2014 01:17 PM   Modules accepted: Orders, Medications

## 2014-08-23 NOTE — Assessment & Plan Note (Signed)
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 Tolerating it well 

## 2014-08-23 NOTE — Assessment & Plan Note (Signed)
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 Oncotype DX recurrence score was 8 which translates into a 6% risk of distant recurrence with antiestrogen therapy alone  Aromatase inhibitor counseling: Patient is tolerating Arimidex extremely well without any major problems or concerns. She denies any hot flashes or muscle aches or pains. We'll continue to watch and monitor for side effects.  Return to clinic in 3 months for followup.

## 2014-08-25 ENCOUNTER — Encounter: Payer: Self-pay | Admitting: *Deleted

## 2014-08-25 NOTE — Progress Notes (Signed)
[facility] Psychosocial Distress Screening Clinical Social Work  Clinical Social Work was referred by distress screening protocol.  The patient scored a 5 on the Psychosocial Distress Thermometer which indicates moderate distress. Clinical Social Worker phoned pt to assess for distress and other psychosocial needs. CSW left supportive message and awaits return call.   ONCBCN DISTRESS SCREENING 08/23/2014  Screening Type Initial Screening  Distress experienced in past week (1-10) 5  Practical problem type Insurance  Emotional problem type Adjusting to illness;Adjusting to appearance changes  Information Concerns Type   Physical Problem type Constipation/diarrhea    Clinical Social Worker follow up needed: Yes.    If yes, follow up plan:  See above Loren Racer, [place] Worker [place]  [facility] Phone: [phone] Fax: [phone]

## 2014-09-12 ENCOUNTER — Ambulatory Visit: Payer: 59 | Admitting: Radiation Oncology

## 2014-09-13 ENCOUNTER — Ambulatory Visit: Payer: 59 | Admitting: Radiation Oncology

## 2014-10-05 ENCOUNTER — Ambulatory Visit: Payer: 59 | Admitting: Radiation Oncology

## 2014-10-12 ENCOUNTER — Encounter: Payer: Self-pay | Admitting: Oncology

## 2014-10-13 ENCOUNTER — Encounter: Payer: Self-pay | Admitting: Radiation Oncology

## 2014-10-13 ENCOUNTER — Ambulatory Visit
Admission: RE | Admit: 2014-10-13 | Discharge: 2014-10-13 | Disposition: A | Discharge status: Home / Self Care | Payer: 59 | Source: Ambulatory Visit | Attending: Radiation Oncology | Admitting: Radiation Oncology

## 2014-10-13 VITALS — BP 128/82 | HR 82 | Temp 98.2°F | Resp 16 | Ht 64.0 in | Wt 154.3 lb

## 2014-10-13 DIAGNOSIS — C50411 Malignant neoplasm of upper-outer quadrant of right female breast: Secondary | ICD-10-CM

## 2014-10-13 NOTE — Progress Notes (Signed)
Radiation Oncology         [phone] ________________________________  Name: Valerie Heath MRN: [id number]  Date: 10/13/2014  DOB: [birth year]  Follow-Up Visit Note  CC: Marton Redwood, MD  Fanny Skates, MD    ICD-9-CM ICD-10-CM   1. Breast cancer of upper-outer quadrant of right female breast 174.4 C50.411     Diagnosis:   Intraductal carcinoma of the right breast, stage I invasive ductal carcinoma of the left breast (T1c, N0, Mx)  Interval Since Last Radiation:  2  months  Narrative:  The patient returns today for routine follow-up.  She is feeling well this time. She denies any side effects related to her Arimidex. She denies any pain within the breast area. She has noticed some mild crusting along the right nipple area but no nipple drainage or bleeding. She denies any problems with swelling in her right arm or left arm.                              ALLERGIES:  is allergic to erythromycin.  Meds: Current Outpatient Prescriptions  Medication Sig Dispense Refill  . anastrozole (ARIMIDEX) 1 MG tablet Take 1 tablet (1 mg total) by mouth daily. 90 tablet 3  . Calcium Carbonate-Vitamin D (CALCIUM + D PO) Take 1 capsule by mouth daily.     . Cholecalciferol (VITAMIN D PO) Take 1 capsule by mouth daily.     . Flaxseed Oil OIL 1,400 mg by Does not apply route 3 (three) times a week.    Marland Kitchen glucosamine-chondroitin 500-400 MG tablet Take 1,875 tablets by mouth 5 (five) times daily.    . Multiple Vitamin (MULTIVITAMIN) tablet Take 1 tablet by mouth daily.    . NON FORMULARY 300 mg. 5 times a week    . NON FORMULARY Cherry extract 1500 mg 5 times a week    . Omega-3 Fatty Acids (FISH OIL PO) Take 1,000 mg by mouth 3 (three) times a week.     . prochlorperazine (COMPAZINE) 10 MG tablet Take 1 tablet (10 mg total) by mouth every 6 (six) hours as needed for nausea or vomiting. 30 tablet 0  . SYNTHROID 112 MCG tablet Take 112 mcg by mouth daily.  3  . emollient (BIAFINE) cream Apply  topically as needed.    . non-metallic deodorant Jethro Poling) MISC Apply 1 application topically daily as needed.    . Wound Cleansers (RADIAPLEX EX) Apply topically.     No current facility-administered medications for this encounter.    Physical Findings: The patient is in no acute distress. Patient is alert and oriented.  height is 5\' 4"  (1.626 m) and weight is 154 lb 4.8 oz (69.99 kg). Her oral temperature is 98.2 F (36.8 C). Her blood pressure is 128/82 and her pulse is 82. Her respiration is 16. Marland Kitchen  No palpable supraclavicular or axillary adenopathy. The lungs are clear to auscultation. The heart has a regular rhythm and rate. Examination of the left breast reveals no palpable mass nipple discharge or bleeding. The patient's skin is well healed with a good cosmetic result. Examination right breast reveals some continued edema in the nipple areolar complex area. There is some slight crusting noted along the nipple area. No drainage or nipple discharge noted. Should mention the patient palpated a nodule in the superior areolar area. This feels like edema/scar tissue to me. No dominant mass appreciated in the breast  Lab Findings: Lab Results  Component  Value Date   HGB 14.3 04/27/2014    Radiographic Findings: No results found.  Impression:  The patient is recovering from the effects of radiation.  No evidence of recurrence on clinical exam today  Plan:  The patient will return for follow-up in 6 weeks for reexamination of the right nipple areolar area to ensure stability in this area.  ____________________________________ Blair Promise, MD

## 2014-10-13 NOTE — Progress Notes (Signed)
Marsa Aris here for follow up after treatment for bilateral breast cancer.  She denies pain but does have some sore areas underneath her breasts, especially in the right side.  She started taking Arimidex 08/02/14.  She reports her fatigue is improving.  The skin on her breasts has hyperpigmentation.  She has noted a lump on the border between her right breast and nipple area.

## 2014-11-23 ENCOUNTER — Encounter: Payer: Self-pay | Admitting: Radiation Oncology

## 2014-11-23 ENCOUNTER — Ambulatory Visit
Admission: RE | Admit: 2014-11-23 | Discharge: 2014-11-23 | Disposition: A | Discharge status: Home / Self Care | Payer: 59 | Source: Ambulatory Visit | Attending: Radiation Oncology | Admitting: Radiation Oncology

## 2014-11-23 VITALS — BP 141/81 | HR 72 | Temp 97.9°F | Resp 16 | Ht 64.0 in | Wt 147.3 lb

## 2014-11-23 DIAGNOSIS — C50411 Malignant neoplasm of upper-outer quadrant of right female breast: Secondary | ICD-10-CM

## 2014-11-23 NOTE — Progress Notes (Signed)
Radiation Oncology         [phone] ________________________________  Name: Valerie Heath MRN: [id number]  Date: 11/23/2014  DOB: 1951/06/10  Follow-Up Visit Note  CC: Marton Redwood, MD  Fanny Skates, MD    ICD-9-CM ICD-10-CM   1. Breast cancer of upper-outer quadrant of right female breast 174.4 C50.411     Diagnosis:   Intraductal carcinoma of the right breast, stage I invasive ductal carcinoma of the left breast (T1c, N0, Mx)  Interval Since Last Radiation:  4  months  Narrative:  The patient returns today for routine follow-up.  She is doing well this time. She denies any pain in either breast. She denies any nipple discharge or bleeding. She has noticed some mild crusting of the right nipple area. She denies any itching along the right nipple area. Patient is on Arimidex and tolerating this medication well without any side effects.  She is inquiring about moving up her mammogram to March but Dr. Dalbert Batman has requested that she have a mammogram in May with follow-up in general surgery soon afterwards.                            ALLERGIES:  is allergic to erythromycin.  Meds: Current Outpatient Prescriptions  Medication Sig Dispense Refill  . anastrozole (ARIMIDEX) 1 MG tablet Take 1 tablet (1 mg total) by mouth daily. 90 tablet 3  . Calcium Carbonate-Vitamin D (CALCIUM + D PO) Take 1 capsule by mouth daily.     . Cholecalciferol (VITAMIN D PO) Take 1 capsule by mouth daily.     . Flaxseed Oil OIL 1,400 mg by Does not apply route 3 (three) times a week.    Marland Kitchen glucosamine-chondroitin 500-400 MG tablet Take 1,875 tablets by mouth 5 (five) times daily.    . Multiple Vitamin (MULTIVITAMIN) tablet Take 1 tablet by mouth daily.    . NON FORMULARY 300 mg. 5 times a week    . NON FORMULARY Cherry extract 1500 mg 5 times a week    . Omega-3 Fatty Acids (FISH OIL PO) Take 1,000 mg by mouth 3 (three) times a week.     Marland Kitchen SYNTHROID 112 MCG tablet Take 112 mcg by mouth daily.  3  .  emollient (BIAFINE) cream Apply topically as needed.    . non-metallic deodorant Jethro Poling) MISC Apply 1 application topically daily as needed.    . prochlorperazine (COMPAZINE) 10 MG tablet Take 1 tablet (10 mg total) by mouth every 6 (six) hours as needed for nausea or vomiting. (Patient not taking: Reported on 11/23/2014) 30 tablet 0  . Wound Cleansers (RADIAPLEX EX) Apply topically.     No current facility-administered medications for this encounter.    Physical Findings: The patient is in no acute distress. Patient is alert and oriented.  height is 5\' 4"  (1.626 m) and weight is 147 lb 4.8 oz (66.815 kg). Her oral temperature is 97.9 F (36.6 C). Her blood pressure is 141/81 and her pulse is 72. Her respiration is 16. Marland Kitchen  No palpable subclavicular or axillary adenopathy. Lungs are clear to auscultation. The heart has a regular rhythm and rate. Examination of the left breast reveals no palpable mass or nipple discharge. The patient has an excellent cosmetic result. Minimal radiation changes noted. Examination of the right breast reveals some continued edema within the nipple areolar complex area. There is no dominant mass appreciated breast nipple discharge or bleeding. I am unable to  palpate a mass in the upper areolar border area.  the patient has some mild crusting along the right nipple but I am unable to elicit any drainage or bleeding.  Lab Findings: Lab Results  Component Value Date   HGB 14.3 04/27/2014    Radiographic Findings: No results found.  Impression:  No evidence of recurrence on clinical exam today. I recommend the patient Place moisturizer along the right nipple area. I would recommend not proceeding with mammogram at this time in light of patient's continued edema along the right breast. She will proceed with this in May as above and then follow-up with Gen. surgery.  Plan:  Routine follow-up in july. The patient will be seen by medical oncology tomorrow and Gen. surgery in  May with mammogram in May as above.  ____________________________________ Blair Promise, MD

## 2014-11-23 NOTE — Progress Notes (Signed)
Valerie Heath here for follow up after treatment to her bilateral breasts.  She denies pain but does have occasional soreness in both breasts.  She continues to take Arimidex.  She reports being active and working out everyday.  The skin on both breasts is intact.  She does have crusting in her right nipple.  She reports the nodule in her right breast is smaller.  She denies seeing any nipple drainage on the right breast.  She has received a letter for her annual mammogram but was told by Dr. Dalbert Batman to have one in May.  She is wondering if she should have one now or wait.  BP 141/81 mmHg  Pulse 72  Temp(Src) 97.9 F (36.6 C) (Oral)  Resp 16  Ht 5\' 4"  (1.626 m)  Wt 147 lb 4.8 oz (66.815 kg)  BMI 25.27 kg/m2

## 2014-11-24 ENCOUNTER — Ambulatory Visit (HOSPITAL_BASED_OUTPATIENT_CLINIC_OR_DEPARTMENT_OTHER): Payer: 59 | Admitting: Hematology and Oncology

## 2014-11-24 ENCOUNTER — Telehealth: Payer: Self-pay | Admitting: Hematology and Oncology

## 2014-11-24 VITALS — BP 137/74 | HR 71 | Temp 97.5°F | Resp 18 | Ht 64.0 in | Wt 147.5 lb

## 2014-11-24 DIAGNOSIS — Z170 Estrogen receptor positive status [ER+]: Secondary | ICD-10-CM

## 2014-11-24 DIAGNOSIS — D0511 Intraductal carcinoma in situ of right breast: Secondary | ICD-10-CM

## 2014-11-24 DIAGNOSIS — C50412 Malignant neoplasm of upper-outer quadrant of left female breast: Secondary | ICD-10-CM

## 2014-11-24 DIAGNOSIS — C50411 Malignant neoplasm of upper-outer quadrant of right female breast: Secondary | ICD-10-CM

## 2014-11-24 NOTE — Telephone Encounter (Signed)
per pofot sh pt appt-gave pt copy odf sch

## 2014-11-24 NOTE — Assessment & Plan Note (Signed)
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 Oncotype DX recurrence score was 8 which translates into a 6% risk of distant recurrence with antiestrogen therapy alone  Aromatase inhibitor counseling: Patient is tolerating Arimidex extremely well without any major problems or concerns. She denies any hot flashes or muscle aches or pains. We'll continue to watch and monitor for side effects.  Breast cancer surveillance: 1. Breast exam 11/24/2014 is normal 2. Mammogram to be done 12/23/2014  Return to clinic in 6 months for followup.

## 2014-11-24 NOTE — Progress Notes (Signed)
Patient Care Team: William Shaw, MD as PCP - General (Internal Medicine)  DIAGNOSIS: No matching staging information was found for the patient.  SUMMARY OF ONCOLOGIC HISTORY: Oncology History   Grams mammogram in [place]     Breast cancer of upper-outer quadrant of right female breast   03/29/2014 Initial Diagnosis MRI finding of suspicious changes   04/27/2014 Surgery Right breast lumpectomy: Fibrocystic changes; 5 sentinel lymph nodes negative    Breast cancer of upper-outer quadrant of left female breast   03/29/2014 Initial Diagnosis Breast cancer of upper-outer quadrant of left female breast   04/27/2014 Surgery Left breast lumpectomy: Invasive ductal carcinoma grade 1/3; 1.5 cm with DCIS, ER 100%, PR 53%, Ki-67 8%, HER-2 negative ratio 1.21   06/14/2014 - 08/01/2014 Radiation Therapy Adjuvant radiation therapy   07/26/2014 Procedure [facility]. Genetic testing was normal, and did not reveal a deleterious mutation in these genes. A complete list of all genes tested is located on the test report scanned into EPIC.     08/02/2014 -  Anti-estrogen oral therapy Arimidex 1 mg daily X 5 years    CHIEF COMPLIANT: Follow-up anastrozole  INTERVAL HISTORY: Valerie Heath is a 64-year-old lady with above-mentioned history of bilateral breast cancers who underwent bilateral lumpectomies and radiation. She is currently on oral antiestrogen therapy with anastrozole and tolerating it extremely well without any major problems. Patient is working out 2 times a day and staying healthy and fit and active. Denies any new lumps or nodules in the breasts  REVIEW OF SYSTEMS:   Constitutional: Denies fevers, chills or abnormal weight loss Eyes: Denies blurriness of vision Ears, nose, mouth, throat, and face: Denies mucositis or sore throat Respiratory: Denies cough, dyspnea or wheezes Cardiovascular: Denies palpitation, chest discomfort or lower extremity swelling Gastrointestinal:  Denies nausea,  heartburn or change in bowel habits Skin: Denies abnormal skin rashes Lymphatics: Denies new lymphadenopathy or easy bruising Neurological:Denies numbness, tingling or new weaknesses Behavioral/Psych: Mood is stable, no new changes  Breast:  denies any pain or lumps or nodules in either breasts All other systems were reviewed with the patient and are negative.  I have reviewed the past medical history, past surgical history, social history and family history with the patient and they are unchanged from previous note.  ALLERGIES:  is allergic to erythromycin.  MEDICATIONS:  Current Outpatient Prescriptions  Medication Sig Dispense Refill  . anastrozole (ARIMIDEX) 1 MG tablet Take 1 tablet (1 mg total) by mouth daily. 90 tablet 3  . Calcium Carbonate-Vitamin D (CALCIUM + D PO) Take 1 capsule by mouth daily.     . Cholecalciferol (VITAMIN D PO) Take 1 capsule by mouth daily.     . emollient (BIAFINE) cream Apply topically as needed.    . Flaxseed Oil OIL 1,400 mg by Does not apply route 3 (three) times a week.    . glucosamine-chondroitin 500-400 MG tablet Take 1,875 tablets by mouth 5 (five) times daily.    . Multiple Vitamin (MULTIVITAMIN) tablet Take 1 tablet by mouth daily.    . NON FORMULARY 300 mg. 5 times a week    . NON FORMULARY Cherry extract 1500 mg 5 times a week    . non-metallic deodorant (ALRA) MISC Apply 1 application topically daily as needed.    . Omega-3 Fatty Acids (FISH OIL PO) Take 1,000 mg by mouth 3 (three) times a week.     . prochlorperazine (COMPAZINE) 10 MG tablet Take 1 tablet (10 mg total) by mouth every   6 (six) hours as needed for nausea or vomiting. 30 tablet 0  . SYNTHROID 112 MCG tablet Take 112 mcg by mouth daily.  3  . Wound Cleansers (RADIAPLEX EX) Apply topically.     No current facility-administered medications for this visit.    PHYSICAL EXAMINATION: ECOG PERFORMANCE STATUS: 0 - Asymptomatic  Filed Vitals:   11/24/14 1006  BP: 137/74  Pulse:  71  Temp: 97.5 F (36.4 C)  Resp: 18   Filed Weights   11/24/14 1006  Weight: 147 lb 8 oz (66.906 kg)    GENERAL:alert, no distress and comfortable SKIN: skin color, texture, turgor are normal, no rashes or significant lesions EYES: normal, Conjunctiva are pink and non-injected, sclera clear OROPHARYNX:no exudate, no erythema and lips, buccal mucosa, and tongue normal  NECK: supple, thyroid normal size, non-tender, without nodularity LYMPH:  no palpable lymphadenopathy in the cervical, axillary or inguinal LUNGS: clear to auscultation and percussion with normal breathing effort HEART: regular rate & rhythm and no murmurs and no lower extremity edema ABDOMEN:abdomen soft, non-tender and normal bowel sounds Musculoskeletal:no cyanosis of digits and no clubbing  NEURO: alert & oriented x 3 with fluent speech, no focal motor/sensory deficits BREAST: No palpable masses or nodules in either right or left breasts. There is slight crusting around the right nipple but it is much improved from before and is slightly tender to palpation. Most likely related to prior radiation therapy. No palpable axillary supraclavicular or infraclavicular adenopathy no breast tenderness or nipple discharge. 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 Oncotype DX recurrence score was 8 which translates into a 6% risk of distant recurrence with antiestrogen therapy alone  Aromatase inhibitor counseling: Patient is tolerating Arimidex extremely well without any major problems or concerns. She denies any hot flashes or muscle aches or pains. We'll continue to watch and monitor for side effects.  Breast cancer surveillance: 1. Breast exam 11/24/2014 is normal 2. Mammogram to be done 12/23/2014  Return to clinic in 6 months for followup.    No orders of the defined types were placed in this encounter.   The patient has a good understanding of the overall plan. she agrees with it. She will call with any problems that may develop before her next visit here.   Rulon Eisenmenger, MD

## 2014-11-24 NOTE — Assessment & Plan Note (Signed)
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 Tolerating it well

## 2014-12-12 ENCOUNTER — Other Ambulatory Visit: Payer: Self-pay

## 2014-12-12 DIAGNOSIS — Z1239 Encounter for other screening for malignant neoplasm of breast: Secondary | ICD-10-CM

## 2015-01-03 ENCOUNTER — Other Ambulatory Visit: Payer: Self-pay | Admitting: Gynecology

## 2015-01-03 DIAGNOSIS — Z853 Personal history of malignant neoplasm of breast: Secondary | ICD-10-CM

## 2015-01-06 ENCOUNTER — Ambulatory Visit
Admission: RE | Admit: 2015-01-06 | Discharge: 2015-01-06 | Disposition: A | Discharge status: Home / Self Care | Payer: 59 | Source: Ambulatory Visit

## 2015-01-06 DIAGNOSIS — Z853 Personal history of malignant neoplasm of breast: Secondary | ICD-10-CM

## 2015-03-30 ENCOUNTER — Ambulatory Visit
Admission: RE | Admit: 2015-03-30 | Discharge: 2015-03-30 | Disposition: A | Discharge status: Home / Self Care | Payer: 59 | Source: Ambulatory Visit | Attending: Radiation Oncology | Admitting: Radiation Oncology

## 2015-03-30 ENCOUNTER — Encounter: Payer: Self-pay | Admitting: Radiation Oncology

## 2015-03-30 VITALS — BP 135/73 | HR 67 | Temp 98.4°F | Resp 12 | Ht 64.0 in | Wt 141.0 lb

## 2015-03-30 DIAGNOSIS — C50411 Malignant neoplasm of upper-outer quadrant of right female breast: Secondary | ICD-10-CM

## 2015-03-30 DIAGNOSIS — C50412 Malignant neoplasm of upper-outer quadrant of left female breast: Secondary | ICD-10-CM

## 2015-03-30 NOTE — Progress Notes (Signed)
Valerie Heath here for follow up.  She denies pain but does have soreness in both breasts, right greater than left.  She is taking Arimidex.  She denies fatigue.  She has lost 6 lbs since March and is trying to loose weight.  The skin on both breasts is intact.    BP 135/73 mmHg  Pulse 67  Temp(Src) 98.4 F (36.9 C) (Oral)  Resp 12  Ht 5\' 4"  (1.626 m)  Wt 141 lb (63.957 kg)  BMI 24.19 kg/m2

## 2015-03-30 NOTE — Progress Notes (Signed)
Radiation Oncology         [phone] ________________________________  Name: Valerie Heath MRN: [id number]  Date: 03/30/2015  DOB: July 26, 1951  Follow-Up Visit Note  CC: Marton Redwood, MD  Fanny Skates, MD  No diagnosis found.  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 She denies pain but does have soreness in both breasts, right greater than left. She is taking Arimidex. She denies fatigue. She has lost 6 lbs since March and is trying to lose weight. The skin on both breasts is intact. She has had no nipple discharge or bleeding, just some crusting on the right side. She has mentioned this to Dr. Lindi Adie, as well. She had a mammogram at the [facility] on [facility]. in March. She denies swelling or pain in her arms.                              ALLERGIES:  is allergic to erythromycin.  Meds: Current Outpatient Prescriptions  Medication Sig Dispense Refill  . anastrozole (ARIMIDEX) 1 MG tablet Take 1 tablet (1 mg total) by mouth daily. 90 tablet 3  . Calcium Carbonate-Vitamin D (CALCIUM + D PO) Take 1 capsule by mouth daily.     . Cholecalciferol (VITAMIN D PO) Take 1 capsule by mouth daily.     Marland Kitchen emollient (BIAFINE) cream Apply topically as needed.    . Flaxseed Oil OIL 1,400 mg by Does not apply route 3 (three) times a week.    Marland Kitchen glucosamine-chondroitin 500-400 MG tablet Take 1,875 tablets by mouth 5 (five) times daily.    . Multiple Vitamin (MULTIVITAMIN) tablet Take 1 tablet by mouth daily.    . NON FORMULARY 300 mg. 5 times a week    . NON FORMULARY Cherry extract 1500 mg 5 times a week    . non-metallic deodorant (ALRA) MISC Apply 1 application topically daily as needed.    . Omega-3 Fatty Acids (FISH OIL PO) Take 1,000 mg by mouth 3 (three) times a week.     . prochlorperazine  (COMPAZINE) 10 MG tablet Take 1 tablet (10 mg total) by mouth every 6 (six) hours as needed for nausea or vomiting. 30 tablet 0  . SYNTHROID 112 MCG tablet Take 112 mcg by mouth daily.  3  . Wound Cleansers (RADIAPLEX EX) Apply topically.     No current facility-administered medications for this encounter.    Physical Findings: The patient is in no acute distress. Patient is alert and oriented.  vitals were not taken for this visit.Marland Kitchen  No palpable supraclavicular or axillary adenopathy. Lungs are clear to auscultation. The heart has a regular rhythm and rate. Swelling in the right nipple areolar complex area and very mild crusting of the nipple (present  since treatment). The nipple areolar complex area shows some mild edema. no palpable mass within the right breast. Examination of the left breast reveals no palpable mass or nipple discharge. Patient exhibits excellent cosmetic result with both breasts.  Lab Findings: Lab Results  Component Value Date   HGB 14.3 04/27/2014    Radiographic Findings: No results found.  Impression:  No evidence of recurrence on clinical exam today.   Plan:  Prn followup in rad/onc. Will continue to follow up with medical oncology  regularly and surgery.   This document serves as a record of services personally performed by Gery Pray, MD. It was created on his behalf by Arlyce Harman, a trained medical scribe. The creation of this record is based on the scribe's personal observations and the provider's statements to them. This document has been checked and approved by the attending provider.  -----------------------------------  Blair Promise, PhD, MD

## 2015-05-12 ENCOUNTER — Telehealth: Payer: Self-pay | Admitting: Hematology and Oncology

## 2015-05-12 NOTE — Telephone Encounter (Signed)
Appointments moved per dr Lindi Adie out of office,left a message   anne

## 2015-05-31 ENCOUNTER — Encounter: Payer: Self-pay | Admitting: Hematology and Oncology

## 2015-05-31 ENCOUNTER — Telehealth: Payer: Self-pay | Admitting: Hematology and Oncology

## 2015-05-31 ENCOUNTER — Ambulatory Visit (HOSPITAL_BASED_OUTPATIENT_CLINIC_OR_DEPARTMENT_OTHER): Payer: 59 | Admitting: Hematology and Oncology

## 2015-05-31 VITALS — BP 142/84 | HR 78 | Temp 98.2°F | Resp 18 | Ht 64.0 in | Wt 143.7 lb

## 2015-05-31 DIAGNOSIS — C50411 Malignant neoplasm of upper-outer quadrant of right female breast: Secondary | ICD-10-CM

## 2015-05-31 DIAGNOSIS — Z170 Estrogen receptor positive status [ER+]: Secondary | ICD-10-CM

## 2015-05-31 DIAGNOSIS — C50412 Malignant neoplasm of upper-outer quadrant of left female breast: Secondary | ICD-10-CM

## 2015-05-31 DIAGNOSIS — D0511 Intraductal carcinoma in situ of right breast: Secondary | ICD-10-CM | POA: Diagnosis not present

## 2015-05-31 MED ORDER — ANASTROZOLE 1 MG PO TABS: 1.0000 mg | ORAL_TABLET | Freq: Every day | ORAL | Status: DC

## 2015-05-31 NOTE — Progress Notes (Signed)
Patient Care Team: Marton Redwood, MD as PCP - General (Internal Medicine)  DIAGNOSIS: No matching staging information was found for the patient.  SUMMARY OF ONCOLOGIC HISTORY: Oncology History   Grams mammogram in [place]     Breast cancer of upper-outer quadrant of right female breast   03/29/2014 Initial Diagnosis MRI finding of suspicious changes   04/27/2014 Surgery Right breast lumpectomy: Fibrocystic changes; 5 sentinel lymph nodes negative    Breast cancer of upper-outer quadrant of left female breast   03/29/2014 Initial Diagnosis Breast cancer of upper-outer quadrant of left female breast   04/27/2014 Surgery Left breast lumpectomy: Invasive ductal carcinoma grade 1/3; 1.5 cm with DCIS, ER 100%, PR 53%, Ki-67 8%, HER-2 negative ratio 1.21   06/14/2014 - 08/01/2014 Radiation Therapy Adjuvant radiation therapy   07/26/2014 Procedure [facility]. Genetic testing was normal, and did not reveal a deleterious mutation in these genes. A complete list of all genes tested is located on the test report scanned into EPIC.     08/02/2014 -  Anti-estrogen oral therapy Arimidex 1 mg daily X 5 years    CHIEF COMPLIANT:  Follow-up on Arimidex  INTERVAL HISTORY: Valerie Heath is a  64 year old with above-mentioned history of left breast cancer  And right breast lumpectomy. She is here for six-month follow-up on Arimidex therapy. She is tolerating it extremely well. She denies any hot flashes or myalgias. She does a lot of exercises at home and a PSA active. She has noticed a small skin scab on the chest wall. It is sensitive occasionally. She denies any lumps or nodules in the breasts.  she tells me that her husband was diagnosed with MS at the age of 55 and had lost his vision and was semi-paralyzed. But now he is in the 34s and his vision has come back and is doing well. The both exercise together.  REVIEW OF SYSTEMS:   Constitutional: Denies fevers, chills or abnormal weight loss Eyes:  Denies blurriness of vision Ears, nose, mouth, throat, and face: Denies mucositis or sore throat Respiratory: Denies cough, dyspnea or wheezes Cardiovascular: Denies palpitation, chest discomfort or lower extremity swelling Gastrointestinal:  Denies nausea, heartburn or change in bowel habits Skin: Denies abnormal skin rashes Lymphatics: Denies new lymphadenopathy or easy bruising Neurological:Denies numbness, tingling or new weaknesses Behavioral/Psych: Mood is stable, no new changes  Breast:  denies any pain or lumps or nodules in either breasts All other systems were reviewed with the patient and are negative.  I have reviewed the past medical history, past surgical history, social history and family history with the patient and they are unchanged from previous note.  ALLERGIES:  is allergic to erythromycin.  MEDICATIONS:  Current Outpatient Prescriptions  Medication Sig Dispense Refill  . anastrozole (ARIMIDEX) 1 MG tablet Take 1 tablet (1 mg total) by mouth daily. 90 tablet 3  . Calcium Carbonate-Vitamin D (CALCIUM + D PO) Take 1 capsule by mouth daily.     . Cholecalciferol (VITAMIN D PO) Take 1 capsule by mouth daily.     Marland Kitchen emollient (BIAFINE) cream Apply topically as needed.    . Flaxseed Oil OIL 1,400 mg by Does not apply route 3 (three) times a week.    Marland Kitchen glucosamine-chondroitin 500-400 MG tablet Take 1,875 tablets by mouth 5 (five) times daily.    . Multiple Vitamin (MULTIVITAMIN) tablet Take 1 tablet by mouth daily.    . NON FORMULARY 300 mg. 5 times a week    . NON FORMULARY  Cherry extract 1500 mg 5 times a week    . non-metallic deodorant (ALRA) MISC Apply 1 application topically daily as needed.    . Omega-3 Fatty Acids (FISH OIL PO) Take 1,000 mg by mouth 3 (three) times a week.     . prochlorperazine (COMPAZINE) 10 MG tablet Take 1 tablet (10 mg total) by mouth every 6 (six) hours as needed for nausea or vomiting. 30 tablet 0  . SYNTHROID 100 MCG tablet Take 100 mcg by  mouth daily.  6  . valACYclovir (VALTREX) 500 MG tablet TAKE 4 TABLETS BY MOUTH ON START OF UCLERATION, THEN 4 TABLETS 12 HOURS LATER  3  . Wound Cleansers (RADIAPLEX EX) Apply topically.     No current facility-administered medications for this visit.    PHYSICAL EXAMINATION: ECOG PERFORMANCE STATUS: 0 - Asymptomatic  Filed Vitals:   05/31/15 0950  BP: 142/84  Pulse: 78  Temp: 98.2 F (36.8 C)  Resp: 18   Filed Weights   05/31/15 0950  Weight: 143 lb 11.2 oz (65.182 kg)    GENERAL:alert, no distress and comfortable SKIN: skin color, texture, turgor are normal, no rashes or significant lesions EYES: normal, Conjunctiva are pink and non-injected, sclera clear OROPHARYNX:no exudate, no erythema and lips, buccal mucosa, and tongue normal  NECK: supple, thyroid normal size, non-tender, without nodularity LYMPH:  no palpable lymphadenopathy in the cervical, axillary or inguinal LUNGS: clear to auscultation and percussion with normal breathing effort HEART: regular rate & rhythm and no murmurs and no lower extremity edema ABDOMEN:abdomen soft, non-tender and normal bowel sounds Musculoskeletal:no cyanosis of digits and no clubbing  NEURO: alert & oriented x 3 with fluent speech, no focal motor/sensory deficits BREAST: No palpable masses or nodules in either right or left breasts.  Small skin scab over the left middle chest wall area. No palpable axillary supraclavicular or infraclavicular adenopathy no breast tenderness or nipple discharge. 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 N0  M0 ER/PR positive HER-2 negative treated with lumpectomy in June 2015, sentinel lymph node study done August 2015 with negative. Oncotype DX recurrence score was 8 which translates into a 6% risk of distant recurrence  Status post radiation therapy completed November 2015, started anastrozole 08/02/2014   Anastrozole toxicities: Patient does not have any side effects from anastrozole.  chest wall skin scab: instructed her to keep an eye on it closely. Breast Cancer Surveillance: 1. Breast exam  05/31/2015: Normal 2. Mammogram  April 2016 No abnormalities. Postsurgical changes. Breast Density Category  C. I recommended that she get 3-D mammograms for surveillance. Discussed the differences between different breast density categories.   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  breast exam and mammogram were reviewed and there were normal   No orders of the defined types were placed in this encounter.   The patient has a good understanding of the overall plan. she agrees  with it. she will call with any problems that may develop before the next visit here.   Rulon Eisenmenger, MD

## 2015-05-31 NOTE — Telephone Encounter (Signed)
Gave avs & calendar for March 2017. °

## 2015-05-31 NOTE — Assessment & Plan Note (Signed)
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 Oncotype DX recurrence score was 8 which translates into a 6% risk of distant recurrence  Status post radiation therapy completed November 2015, started anastrozole 08/02/2014   Anastrozole toxicities: Patient does not have any side effects from anastrozole.  Breast Cancer Surveillance: 1. Breast exam  05/31/2015: Normal 2. Mammogram  April 2016 No abnormalities. Postsurgical changes. Breast Density Category  C. I recommended that she get 3-D mammograms for surveillance. Discussed the differences between different breast density categories.   return to clinic in 6 months for follow-up

## 2015-05-31 NOTE — Assessment & Plan Note (Signed)
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  breast exam and mammogram were reviewed and there were normal

## 2015-06-02 ENCOUNTER — Ambulatory Visit: Payer: 59 | Admitting: Hematology and Oncology

## 2015-06-12 ENCOUNTER — Encounter: Payer: 59 | Admitting: Gynecology

## 2015-06-19 ENCOUNTER — Encounter: Payer: Self-pay | Admitting: Gynecology

## 2015-06-19 ENCOUNTER — Ambulatory Visit (INDEPENDENT_AMBULATORY_CARE_PROVIDER_SITE_OTHER): Payer: 59 | Admitting: Gynecology

## 2015-06-19 ENCOUNTER — Telehealth: Payer: Self-pay | Admitting: *Deleted

## 2015-06-19 VITALS — BP 124/80 | Ht 65.0 in | Wt 143.0 lb

## 2015-06-19 DIAGNOSIS — Z23 Encounter for immunization: Secondary | ICD-10-CM

## 2015-06-19 DIAGNOSIS — Z01419 Encounter for gynecological examination (general) (routine) without abnormal findings: Secondary | ICD-10-CM

## 2015-06-19 DIAGNOSIS — C50919 Malignant neoplasm of unspecified site of unspecified female breast: Secondary | ICD-10-CM | POA: Diagnosis not present

## 2015-06-19 DIAGNOSIS — L900 Lichen sclerosus et atrophicus: Secondary | ICD-10-CM | POA: Diagnosis not present

## 2015-06-19 DIAGNOSIS — N952 Postmenopausal atrophic vaginitis: Secondary | ICD-10-CM | POA: Diagnosis not present

## 2015-06-19 NOTE — Telephone Encounter (Signed)
Pt informed with the below note. 

## 2015-06-19 NOTE — Telephone Encounter (Signed)
-----   Message from Anastasio Auerbach, MD sent at 06/19/2015 12:08 PM EDT ----- Tell patient I did find the genetic report that was negative. If she is interest in pursuing the surgery let us know. One alternative would be to do ultrasound/CA-125 screening also. If she is interested in doing that instead of surgery we can arrange for this.

## 2015-06-19 NOTE — Patient Instructions (Signed)
Follow up if you decide to pursue having the fallopian tubes and ovaries removed surgery.  You may obtain a copy of any labs that were done today by logging onto MyChart as outlined in the instructions provided with your AVS (after visit summary). The office will not call with normal lab results but certainly if there are any significant abnormalities then we will contact you.   Health Maintenance Adopting a healthy lifestyle and getting preventive care can go a long way to promote health and wellness. Talk with your health care provider about what schedule of regular examinations is right for you. This is a good chance for you to check in with your provider about disease prevention and staying healthy. In between checkups, there are plenty of things you can do on your own. Experts have done a lot of research about which lifestyle changes and preventive measures are most likely to keep you healthy. Ask your health care provider for more information. WEIGHT AND DIET  Eat a healthy diet  Be sure to include plenty of vegetables, fruits, low-fat dairy products, and lean protein.  Do not eat a lot of foods high in solid fats, added sugars, or salt.  Get regular exercise. This is one of the most important things you can do for your health.  Most adults should exercise for at least 150 minutes each week. The exercise should increase your heart rate and make you sweat (moderate-intensity exercise).  Most adults should also do strengthening exercises at least twice a week. This is in addition to the moderate-intensity exercise.  Maintain a healthy weight  Body mass index (BMI) is a measurement that can be used to identify possible weight problems. It estimates body fat based on height and weight. Your health care provider can help determine your BMI and help you achieve or maintain a healthy weight.  For females 66 years of age and older:   A BMI below 18.5 is considered underweight.  A BMI of  18.5 to 24.9 is normal.  A BMI of 25 to 29.9 is considered overweight.  A BMI of 30 and above is considered obese.  Watch levels of cholesterol and blood lipids  You should start having your blood tested for lipids and cholesterol at 63 years of age, then have this test every 5 years.  You may need to have your cholesterol levels checked more often if:  Your lipid or cholesterol levels are high.  You are older than 63 years of age.  You are at high risk for heart disease.  CANCER SCREENING   Lung Cancer  Lung cancer screening is recommended for adults 66-41 years old who are at high risk for lung cancer because of a history of smoking.  A yearly low-dose CT scan of the lungs is recommended for people who:  Currently smoke.  Have quit within the past 15 years.  Have at least a 30-pack-year history of smoking. A pack year is smoking an average of one pack of cigarettes a day for 1 year.  Yearly screening should continue until it has been 15 years since you quit.  Yearly screening should stop if you develop a health problem that would prevent you from having lung cancer treatment.  Breast Cancer  Practice breast self-awareness. This means understanding how your breasts normally appear and feel.  It also means doing regular breast self-exams. Let your health care provider know about any changes, no matter how small.  If you are in your 41s or  54s, you should have a clinical breast exam (CBE) by a health care provider every 1-3 years as part of a regular health exam.  If you are 42 or older, have a CBE every year. Also consider having a breast X-ray (mammogram) every year.  If you have a family history of breast cancer, talk to your health care provider about genetic screening.  If you are at high risk for breast cancer, talk to your health care provider about having an MRI and a mammogram every year.  Breast cancer gene (BRCA) assessment is recommended for women who  have family members with BRCA-related cancers. BRCA-related cancers include:  Breast.  Ovarian.  Tubal.  Peritoneal cancers.  Results of the assessment will determine the need for genetic counseling and BRCA1 and BRCA2 testing. Cervical Cancer Routine pelvic examinations to screen for cervical cancer are no longer recommended for nonpregnant women who are considered low risk for cancer of the pelvic organs (ovaries, uterus, and vagina) and who do not have symptoms. A pelvic examination may be necessary if you have symptoms including those associated with pelvic infections. Ask your health care provider if a screening pelvic exam is right for you.   The Pap test is the screening test for cervical cancer for women who are considered at risk.  If you had a hysterectomy for a problem that was not cancer or a condition that could lead to cancer, then you no longer need Pap tests.  If you are older than 65 years, and you have had normal Pap tests for the past 10 years, you no longer need to have Pap tests.  If you have had past treatment for cervical cancer or a condition that could lead to cancer, you need Pap tests and screening for cancer for at least 20 years after your treatment.  If you no longer get a Pap test, assess your risk factors if they change (such as having a new sexual partner). This can affect whether you should start being screened again.  Some women have medical problems that increase their chance of getting cervical cancer. If this is the case for you, your health care provider may recommend more frequent screening and Pap tests.  The human papillomavirus (HPV) test is another test that may be used for cervical cancer screening. The HPV test looks for the virus that can cause cell changes in the cervix. The cells collected during the Pap test can be tested for HPV.  The HPV test can be used to screen women 97 years of age and older. Getting tested for HPV can extend the  interval between normal Pap tests from three to five years.  An HPV test also should be used to screen women of any age who have unclear Pap test results.  After 63 years of age, women should have HPV testing as often as Pap tests.  Colorectal Cancer  This type of cancer can be detected and often prevented.  Routine colorectal cancer screening usually begins at 63 years of age and continues through 63 years of age.  Your health care provider may recommend screening at an earlier age if you have risk factors for colon cancer.  Your health care provider may also recommend using home test kits to check for hidden blood in the stool.  A small camera at the end of a tube can be used to examine your colon directly (sigmoidoscopy or colonoscopy). This is done to check for the earliest forms of colorectal cancer.  Routine screening usually begins at age 70.  Direct examination of the colon should be repeated every 5-10 years through 63 years of age. However, you may need to be screened more often if early forms of precancerous polyps or small growths are found. Skin Cancer  Check your skin from head to toe regularly.  Tell your health care provider about any new moles or changes in moles, especially if there is a change in a mole's shape or color.  Also tell your health care provider if you have a mole that is larger than the size of a pencil eraser.  Always use sunscreen. Apply sunscreen liberally and repeatedly throughout the day.  Protect yourself by wearing long sleeves, pants, a wide-brimmed hat, and sunglasses whenever you are outside. HEART DISEASE, DIABETES, AND HIGH BLOOD PRESSURE   Have your blood pressure checked at least every 1-2 years. High blood pressure causes heart disease and increases the risk of stroke.  If you are between 80 years and 2 years old, ask your health care provider if you should take aspirin to prevent strokes.  Have regular diabetes screenings. This  involves taking a blood sample to check your fasting blood sugar level.  If you are at a normal weight and have a low risk for diabetes, have this test once every three years after 63 years of age.  If you are overweight and have a high risk for diabetes, consider being tested at a younger age or more often. PREVENTING INFECTION  Hepatitis B  If you have a higher risk for hepatitis B, you should be screened for this virus. You are considered at high risk for hepatitis B if:  You were born in a country where hepatitis B is common. Ask your health care provider which countries are considered high risk.  Your parents were born in a high-risk country, and you have not been immunized against hepatitis B (hepatitis B vaccine).  You have HIV or AIDS.  You use needles to inject street drugs.  You live with someone who has hepatitis B.  You have had sex with someone who has hepatitis B.  You get hemodialysis treatment.  You take certain medicines for conditions, including cancer, organ transplantation, and autoimmune conditions. Hepatitis C  Blood testing is recommended for:  Everyone born from [birth year] through 1964.  Anyone with known risk factors for hepatitis C. Sexually transmitted infections (STIs)  You should be screened for sexually transmitted infections (STIs) including gonorrhea and chlamydia if:  You are sexually active and are younger than 63 years of age.  You are older than 63 years of age and your health care provider tells you that you are at risk for this type of infection.  Your sexual activity has changed since you were last screened and you are at an increased risk for chlamydia or gonorrhea. Ask your health care provider if you are at risk.  If you do not have HIV, but are at risk, it may be recommended that you take a prescription medicine daily to prevent HIV infection. This is called pre-exposure prophylaxis (PrEP). You are considered at risk if:  You are  sexually active and do not regularly use condoms or know the HIV status of your partner(s).  You take drugs by injection.  You are sexually active with a partner who has HIV. Talk with your health care provider about whether you are at high risk of being infected with HIV. If you choose to begin PrEP, you should first  be tested for HIV. You should then be tested every 3 months for as long as you are taking PrEP.  PREGNANCY   If you are premenopausal and you may become pregnant, ask your health care provider about preconception counseling.  If you may become pregnant, take 400 to 800 micrograms (mcg) of folic acid every day.  If you want to prevent pregnancy, talk to your health care provider about birth control (contraception). OSTEOPOROSIS AND MENOPAUSE   Osteoporosis is a disease in which the bones lose minerals and strength with aging. This can result in serious bone fractures. Your risk for osteoporosis can be identified using a bone density scan.  If you are 60 years of age or older, or if you are at risk for osteoporosis and fractures, ask your health care provider if you should be screened.  Ask your health care provider whether you should take a calcium or vitamin D supplement to lower your risk for osteoporosis.  Menopause may have certain physical symptoms and risks.  Hormone replacement therapy may reduce some of these symptoms and risks. Talk to your health care provider about whether hormone replacement therapy is right for you.  HOME CARE INSTRUCTIONS   Schedule regular health, dental, and eye exams.  Stay current with your immunizations.   Do not use any tobacco products including cigarettes, chewing tobacco, or electronic cigarettes.  If you are pregnant, do not drink alcohol.  If you are breastfeeding, limit how much and how often you drink alcohol.  Limit alcohol intake to no more than 1 drink per day for nonpregnant women. One drink equals 12 ounces of beer, 5  ounces of wine, or 1 ounces of hard liquor.  Do not use street drugs.  Do not share needles.  Ask your health care provider for help if you need support or information about quitting drugs.  Tell your health care provider if you often feel depressed.  Tell your health care provider if you have ever been abused or do not feel safe at home. Document Released: 03/25/2011 Document Revised: 01/24/2014 Document Reviewed: 08/11/2013 [facility] Patient Information 2015 [place], [place]. This information is not intended to replace advice given to you by your health care provider. Make sure you discuss any questions you have with your health care provider.

## 2015-06-19 NOTE — Progress Notes (Signed)
Valerie Heath Feb 26, 1951 [id number]        64 y.o.  G0P0 for annual exam.  Several issues noted below.  Past medical history,surgical history, problem list, medications, allergies, family history and social history were all reviewed and documented as reviewed in the EPIC chart.  ROS:  Performed with pertinent positives and negatives included in the history, assessment and plan.   Additional significant findings :  none   Exam: Kim Counsellor Vitals:   06/19/15 1124  BP: 124/80  Height: 5\' 5"  (1.651 m)  Weight: 143 lb (64.864 kg)   General appearance:  Normal affect, orientation and appearance. Skin: Grossly normal HEENT: Without gross lesions.  No cervical or supraclavicular adenopathy. Thyroid normal.  Lungs:  Clear without wheezing, rales or rhonchi Cardiac: RR, without RMG Abdominal:  Soft, nontender, without masses, guarding, rebound, organomegaly or hernia Breasts:  Examined lying and sitting without masses, retractions, discharge or axillary adenopathy.  Bilateral lumpectomy scars noted Pelvic:  Ext/BUS/vagina with atrophic changes  Cervix with atrophic changes  Uterus anteverted, normal size, shape and contour, midline and mobile nontender   Adnexa  Without masses or tenderness    Anus and perineum  Normal   Rectovaginal  Normal sphincter tone without palpated masses or tenderness.    Assessment/Plan:  64 y.o. G0P0 female for annual exam.   1. Postmenopausal/atrophic genital changes. Without significant hot flushes, night sweats, vaginal dryness or any vaginal bleeding. Continue to follow and report any vaginal bleeding. 2. History of bilateral breast cancer. Mother with breast and fallopian tube cancer. Had genetic counseling and subsequent screening last year to include a 24 gene panel which was negative scanned into the media section of Epic. Regardless of being negative, I reviewed with her with such a strong history I think a prophylactic bilateral  salpingo-oophorectomy would be warranted. I reviewed that she may have a genetic marker unknown to the system and still be a significant risk of ovarian or fallopian tube cancer.  I discussed laparoscopic approach and in general was involved with the surgery. Also the issue of surveillance such as surveillance ultrasounds/CA-125 if she decides against surgery as some form of screening, excepting the false-negative/false positive issues.  At this point the patient does not want to pursue anything but will think about it and call me if she wants to pursue this.  Breast exam today NED. Mammography 12/2014. Continue with annual mammography. SBE monthly reviewed. Continue follow up with her oncologist Arimidex 3. Lichen sclerosis. Exam is without significant changes. Occasionally uses Temovate 0.05% cream as needed. Has supply at home and will call if she needs. 4. Pap smear/HPV negative 2015. No Pap smear done today. No history of significant abnormal Pap smears. 5. DEXA 2015 at Dr. Raul Del office. She reports actual improvement overpass study. She'll continue follow up with him in reference to this as I have no copies of these reports. She is on Arimidex and I again reviewed the potential that this may accelerated bone loss and that they should follow this closely. 6. Colonoscopy 2015. Repeat at their recommended interval. 7. Health maintenance. No routine blood work done as this is done at Dr. Raul Del office. Follow up 1 year, sooner as needed.   Anastasio Auerbach MD, 11:56 AM 06/19/2015

## 2015-06-19 NOTE — Telephone Encounter (Signed)
Left message for pt to call.

## 2015-06-19 NOTE — Addendum Note (Signed)
Addended by: Nelva Nay on: 06/19/2015 12:22 PM   Modules accepted: Orders

## 2015-08-19 ENCOUNTER — Other Ambulatory Visit: Payer: Self-pay | Admitting: Hematology and Oncology

## 2015-08-21 NOTE — Telephone Encounter (Signed)
Chart reviewed.

## 2015-11-27 NOTE — Assessment & Plan Note (Signed)
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 Oncotype DX recurrence score was 8 which translates into a 6% risk of distant recurrence Status post radiation therapy completed November 2015, started anastrozole 08/02/2014  Anastrozole toxicities: Patient does not have any side effects from anastrozole. chest wall skin scab: instructed her to keep an eye on it closely. Breast Cancer Surveillance: 1. Breast exam 11/28/15: Normal 2. Mammogram April 2016 No abnormalities. Postsurgical changes. Breast Density Category C. I recommended that she get 3-D mammograms for surveillance. Discussed the differences between different breast density categories.  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 breast exam and mammogram were reviewed and there were normal  

## 2015-11-28 ENCOUNTER — Ambulatory Visit (HOSPITAL_BASED_OUTPATIENT_CLINIC_OR_DEPARTMENT_OTHER): Payer: 59 | Admitting: Hematology and Oncology

## 2015-11-28 ENCOUNTER — Encounter: Payer: Self-pay | Admitting: Hematology and Oncology

## 2015-11-28 ENCOUNTER — Telehealth: Payer: Self-pay | Admitting: Hematology and Oncology

## 2015-11-28 VITALS — BP 131/76 | HR 72 | Temp 97.6°F | Resp 18 | Ht 65.0 in | Wt 150.0 lb

## 2015-11-28 DIAGNOSIS — C50412 Malignant neoplasm of upper-outer quadrant of left female breast: Secondary | ICD-10-CM

## 2015-11-28 DIAGNOSIS — D0512 Intraductal carcinoma in situ of left breast: Secondary | ICD-10-CM

## 2015-11-28 DIAGNOSIS — Z170 Estrogen receptor positive status [ER+]: Secondary | ICD-10-CM

## 2015-11-28 DIAGNOSIS — C50411 Malignant neoplasm of upper-outer quadrant of right female breast: Secondary | ICD-10-CM

## 2015-11-28 NOTE — Telephone Encounter (Signed)
appt made and avs printed °

## 2015-11-28 NOTE — Progress Notes (Signed)
Patient Care Team: Marton Redwood, MD as PCP - General (Internal Medicine)  SUMMARY OF ONCOLOGIC HISTORY: Oncology History   Grams mammogram in [employer]     Breast cancer of upper-outer quadrant of right female breast ([place])   03/29/2014 Initial Diagnosis MRI finding of suspicious changes   04/27/2014 Surgery Right breast lumpectomy: Fibrocystic changes; 5 sentinel lymph nodes negative    Breast cancer of upper-outer quadrant of left female breast ([place])   03/29/2014 Initial Diagnosis Breast cancer of upper-outer quadrant of left female breast   04/27/2014 Surgery Left breast lumpectomy: Invasive ductal carcinoma grade 1/3; 1.5 cm with DCIS, ER 100%, PR 53%, Ki-67 8%, HER-2 negative ratio 1.21   06/14/2014 - 08/01/2014 Radiation Therapy Adjuvant radiation therapy   07/26/2014 Procedure [place]. Genetic testing was normal, and did not reveal a deleterious mutation in these genes. A complete list of all genes tested is located on the test report scanned into EPIC.     08/02/2014 -  Anti-estrogen oral therapy Arimidex 1 mg daily X 5 years    CHIEF COMPLIANT: Follow-up on anastrozole therapy for bilateral breast cancers  INTERVAL HISTORY: Valerie Heath is a 65 year old with above-mentioned history of left breast cancer treated with lumpectomy adjuvant radiation and is currently on Arimidex since November 2015. She appears to be tolerating it extremely well. She does not have any hot flashes or myalgias. She denies any lumps or nodules in the breasts. Her health is excellent. She gets her mammograms every April.  REVIEW OF SYSTEMS:   Constitutional: Denies fevers, chills or abnormal weight loss Eyes: Denies blurriness of vision Ears, nose, mouth, throat, and face: Denies mucositis or sore throat Respiratory: Denies cough, dyspnea or wheezes Cardiovascular: Denies palpitation, chest discomfort Gastrointestinal:  Denies nausea, heartburn or change in bowel habits Skin: Denies abnormal  skin rashes Lymphatics: Denies new lymphadenopathy or easy bruising Neurological:Denies numbness, tingling or new weaknesses Behavioral/Psych: Mood is stable, no new changes  Extremities: No lower extremity edema Breast:  denies any pain or lumps or nodules in either breasts All other systems were reviewed with the patient and are negative.  I have reviewed the past medical history, past surgical history, social history and family history with the patient and they are unchanged from previous note.  ALLERGIES:  is allergic to erythromycin.  MEDICATIONS:  Current Outpatient Prescriptions  Medication Sig Dispense Refill  . anastrozole (ARIMIDEX) 1 MG tablet Take 1 tablet (1 mg total) by mouth daily. 90 tablet 3  . anastrozole (ARIMIDEX) 1 MG tablet TAKE 1 TABLET EVERY DAY 90 tablet 3  . Calcium Carbonate-Vitamin D (CALCIUM + D PO) Take 1 capsule by mouth daily.     . Cholecalciferol (VITAMIN D PO) Take 1 capsule by mouth daily.     Marland Kitchen emollient (BIAFINE) cream Apply topically as needed.    . Flaxseed Oil OIL 1,400 mg by Does not apply route 3 (three) times a week.    Marland Kitchen glucosamine-chondroitin 500-400 MG tablet Take 1,875 tablets by mouth 5 (five) times daily.    . Multiple Vitamin (MULTIVITAMIN) tablet Take 1 tablet by mouth daily.    . NON FORMULARY Cherry extract 1500 mg 5 times a week    . Omega-3 Fatty Acids (FISH OIL PO) Take 1,000 mg by mouth 3 (three) times a week.     Marland Kitchen SYNTHROID 100 MCG tablet Take 100 mcg by mouth daily.  6  . valACYclovir (VALTREX) 500 MG tablet TAKE 4 TABLETS BY MOUTH ON START OF  UCLERATION, THEN 4 TABLETS 12 HOURS LATER  3  . Wound Cleansers (RADIAPLEX EX) Apply topically.     No current facility-administered medications for this visit.    PHYSICAL EXAMINATION: ECOG PERFORMANCE STATUS: 0 - Asymptomatic  Filed Vitals:   11/28/15 1019  BP: 131/76  Pulse: 72  Temp: 97.6 F (36.4 C)  Resp: 18   Filed Weights   11/28/15 1019  Weight: 150 lb (68.04 kg)     GENERAL:alert, no distress and comfortable SKIN: skin color, texture, turgor are normal, no rashes or significant lesions EYES: normal, Conjunctiva are pink and non-injected, sclera clear OROPHARYNX:no exudate, no erythema and lips, buccal mucosa, and tongue normal  NECK: supple, thyroid normal size, non-tender, without nodularity LYMPH:  no palpable lymphadenopathy in the cervical, axillary or inguinal LUNGS: clear to auscultation and percussion with normal breathing effort HEART: regular rate & rhythm and no murmurs and no lower extremity edema ABDOMEN:abdomen soft, non-tender and normal bowel sounds MUSCULOSKELETAL:no cyanosis of digits and no clubbing  NEURO: alert & oriented x 3 with fluent speech, no focal motor/sensory deficits EXTREMITIES: No lower extremity edema BREAST: No palpable masses or nodules in either right or left breasts. No palpable axillary supraclavicular or infraclavicular adenopathy no breast tenderness or nipple discharge. 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 Oncotype DX recurrence score was 8 which translates into a 6% risk of distant recurrence Status post radiation therapy completed November 2015, started anastrozole 08/02/2014  Anastrozole toxicities: Patient does not have any side effects from anastrozole. chest wall skin scab: instructed her to keep an eye on it closely. Breast Cancer Surveillance: 1. Breast exam  11/28/15: Normal 2. Mammogram April 2016 No abnormalities. Postsurgical changes. Breast Density Category C. I recommended that she get 3-D mammograms for surveillance. Discussed the differences between different breast density categories.  Return to clinic in 6 months for follow-up and after that we can see her once a year  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 breast exam and mammogram were reviewed and there were normal    No orders of the defined types were placed in this encounter.   The patient has a good understanding of the overall plan. she agrees with it. she will call with any problems that may develop before the next visit here.   Rulon Eisenmenger, MD 11/28/2015

## 2015-12-04 ENCOUNTER — Other Ambulatory Visit: Payer: Self-pay | Admitting: Gynecology

## 2015-12-04 DIAGNOSIS — Z853 Personal history of malignant neoplasm of breast: Secondary | ICD-10-CM

## 2016-01-15 ENCOUNTER — Ambulatory Visit
Admission: RE | Admit: 2016-01-15 | Discharge: 2016-01-15 | Disposition: A | Discharge status: Home / Self Care | Payer: 59 | Source: Ambulatory Visit | Attending: Gynecology | Admitting: Gynecology

## 2016-01-15 DIAGNOSIS — Z853 Personal history of malignant neoplasm of breast: Secondary | ICD-10-CM

## 2016-05-30 ENCOUNTER — Encounter: Payer: Self-pay | Admitting: Hematology and Oncology

## 2016-05-30 ENCOUNTER — Ambulatory Visit (HOSPITAL_BASED_OUTPATIENT_CLINIC_OR_DEPARTMENT_OTHER): Payer: 59 | Admitting: Hematology and Oncology

## 2016-05-30 ENCOUNTER — Telehealth: Payer: Self-pay | Admitting: Hematology and Oncology

## 2016-05-30 DIAGNOSIS — C50411 Malignant neoplasm of upper-outer quadrant of right female breast: Secondary | ICD-10-CM

## 2016-05-30 DIAGNOSIS — C50412 Malignant neoplasm of upper-outer quadrant of left female breast: Secondary | ICD-10-CM | POA: Diagnosis not present

## 2016-05-30 DIAGNOSIS — Z79811 Long term (current) use of aromatase inhibitors: Secondary | ICD-10-CM | POA: Diagnosis not present

## 2016-05-30 DIAGNOSIS — D0512 Intraductal carcinoma in situ of left breast: Secondary | ICD-10-CM

## 2016-05-30 NOTE — Telephone Encounter (Signed)
appt made and avs printed °

## 2016-05-30 NOTE — Assessment & Plan Note (Signed)
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 breast exam and mammogram were reviewed and there were normal

## 2016-05-30 NOTE — Progress Notes (Signed)
Patient Care Team: Marton Redwood, MD as PCP - General (Internal Medicine)  DIAGNOSIS: No matching staging information was found for the patient.  SUMMARY OF ONCOLOGIC HISTORY: Oncology History   Grams mammogram in [facility]     Breast cancer of upper-outer quadrant of right female breast ([place])   03/29/2014 Initial Diagnosis    MRI finding of suspicious changes      04/27/2014 Surgery    Right breast lumpectomy: Fibrocystic changes; 5 sentinel lymph nodes negative       Breast cancer of upper-outer quadrant of left female breast ([place])   03/29/2014 Initial Diagnosis    Breast cancer of upper-outer quadrant of left female breast      04/27/2014 Surgery    Left breast lumpectomy: Invasive ductal carcinoma grade 1/3; 1.5 cm with DCIS, ER 100%, PR 53%, Ki-67 8%, HER-2 negative ratio 1.21      06/14/2014 - 08/01/2014 Radiation Therapy    Adjuvant radiation therapy      07/26/2014 Procedure    [place]. Genetic testing was normal, and did not reveal a deleterious mutation in these genes. A complete list of all genes tested is located on the test report scanned into EPIC.        08/02/2014 -  Anti-estrogen oral therapy    Arimidex 1 mg daily X 5 years       CHIEF COMPLIANT: follow-up on anastrozole    INTERVAL HISTORY: Valerie Heath is a 65 year old with above-mentioned history of bilateral breast cancers currently on antiestrogen therapy with anastrozole. She is tolerating it extremely well. She denies any hot flashes or myalgias. Recently she had a retinal tear and underwent laser surgery. She has improved vacantly. The floaters are still present in the left eye. She denies any lumps or nodules in the breasts. She has noticed some skin depigmentation at the site of surgery on her breast.  REVIEW OF SYSTEMS:   Constitutional: Denies fevers, chills or abnormal weight loss Eyes: Denies blurriness of vision Ears, nose, mouth, throat, and face: Denies mucositis or sore  throat Respiratory: Denies cough, dyspnea or wheezes Cardiovascular: Denies palpitation, chest discomfort Gastrointestinal:  Denies nausea, heartburn or change in bowel habits Skin: Denies abnormal skin rashes Lymphatics: Denies new lymphadenopathy or easy bruising Neurological:Denies numbness, tingling or new weaknesses Behavioral/Psych: Mood is stable, no new changes  Extremities: No lower extremity edema Breast:  denies any pain or lumps or nodules in either breasts All other systems were reviewed with the patient and are negative.  I have reviewed the past medical history, past surgical history, social history and family history with the patient and they are unchanged from previous note.  ALLERGIES:  is allergic to erythromycin.  MEDICATIONS:  Current Outpatient Prescriptions  Medication Sig Dispense Refill  . anastrozole (ARIMIDEX) 1 MG tablet Take 1 tablet (1 mg total) by mouth daily. 90 tablet 3  . anastrozole (ARIMIDEX) 1 MG tablet TAKE 1 TABLET EVERY DAY 90 tablet 3  . Calcium Carbonate-Vitamin D (CALCIUM + D PO) Take 1 capsule by mouth daily.     . Cholecalciferol (VITAMIN D PO) Take 1 capsule by mouth daily.     Marland Kitchen emollient (BIAFINE) cream Apply topically as needed.    . Flaxseed Oil OIL 1,400 mg by Does not apply route 3 (three) times a week.    Marland Kitchen glucosamine-chondroitin 500-400 MG tablet Take 1,875 tablets by mouth 5 (five) times daily.    . Multiple Vitamin (MULTIVITAMIN) tablet Take 1 tablet by mouth daily.    Marland Kitchen  NON FORMULARY Cherry extract 1500 mg 5 times a week    . Omega-3 Fatty Acids (FISH OIL PO) Take 1,000 mg by mouth 3 (three) times a week.     Marland Kitchen SYNTHROID 100 MCG tablet Take 100 mcg by mouth daily.  6  . valACYclovir (VALTREX) 500 MG tablet TAKE 4 TABLETS BY MOUTH ON START OF UCLERATION, THEN 4 TABLETS 12 HOURS LATER  3  . Wound Cleansers (RADIAPLEX EX) Apply topically.     No current facility-administered medications for this visit.     PHYSICAL  EXAMINATION: ECOG PERFORMANCE STATUS: 0 - Asymptomatic  Vitals:   05/30/16 1007  BP: (!) 137/91  Pulse: 67  Resp: 18  Temp: 98 F (36.7 C)   Filed Weights   05/30/16 1007  Weight: 149 lb 11.2 oz (67.9 kg)    GENERAL:alert, no distress and comfortable SKIN: skin color, texture, turgor are normal, no rashes or significant lesions EYES: normal, Conjunctiva are pink and non-injected, sclera clear OROPHARYNX:no exudate, no erythema and lips, buccal mucosa, and tongue normal  NECK: supple, thyroid normal size, non-tender, without nodularity LYMPH:  no palpable lymphadenopathy in the cervical, axillary or inguinal LUNGS: clear to auscultation and percussion with normal breathing effort HEART: regular rate & rhythm and no murmurs and no lower extremity edema ABDOMEN:abdomen soft, non-tender and normal bowel sounds MUSCULOSKELETAL:no cyanosis of digits and no clubbing  NEURO: alert & oriented x 3 with fluent speech, no focal motor/sensory deficits EXTREMITIES: No lower extremity edema BREAST: No palpable masses or nodules in either right or left breasts. No palpable axillary supraclavicular or infraclavicular adenopathy no breast tenderness or nipple discharge. 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 Oncotype DX recurrence score was 8 which translates into a 6% risk of distant recurrence Status post radiation therapy completed November 2015, started  anastrozole 08/02/2014  Anastrozole toxicities: Patient does not have any side effects from anastrozole. chest wall skin scab: instructed her to keep an eye on it closely.  Breast Cancer Surveillance: 1. Breast exam 05/30/2016: Normal 2. Mammogram April 2017 No abnormalities. Postsurgical changes. Breast Density Category C. I recommended that she get 3-D mammograms for surveillance. Discussed the differences between different breast density categories.  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 breast exam and mammogram were reviewed and there were normal   No orders of the defined types were placed in this encounter.  The patient has a good understanding of the overall plan. she agrees with it. she will call with any problems that may develop before the next visit here.   Rulon Eisenmenger, MD 05/30/16

## 2016-05-30 NOTE — Assessment & Plan Note (Signed)
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 Oncotype DX recurrence score was 8 which translates into a 6% risk of distant recurrence Status post radiation therapy completed November 2015, started anastrozole 08/02/2014  Anastrozole toxicities: Patient does not have any side effects from anastrozole. chest wall skin scab: instructed her to keep an eye on it closely.  Breast Cancer Surveillance: 1. Breast exam 05/30/2016: Normal 2. Mammogram April 2017 No abnormalities. Postsurgical changes. Breast Density Category C. I recommended that she get 3-D mammograms for surveillance. Discussed the differences between different breast density categories.  Return to clinic in one year for follow-up

## 2016-06-19 ENCOUNTER — Encounter: Payer: Self-pay | Admitting: Gynecology

## 2016-06-19 ENCOUNTER — Ambulatory Visit (INDEPENDENT_AMBULATORY_CARE_PROVIDER_SITE_OTHER): Payer: 59 | Admitting: Gynecology

## 2016-06-19 VITALS — BP 118/74 | Ht 66.0 in | Wt 150.0 lb

## 2016-06-19 DIAGNOSIS — Z01419 Encounter for gynecological examination (general) (routine) without abnormal findings: Secondary | ICD-10-CM

## 2016-06-19 DIAGNOSIS — N952 Postmenopausal atrophic vaginitis: Secondary | ICD-10-CM

## 2016-06-19 DIAGNOSIS — R1031 Right lower quadrant pain: Secondary | ICD-10-CM

## 2016-06-19 DIAGNOSIS — Z23 Encounter for immunization: Secondary | ICD-10-CM | POA: Diagnosis not present

## 2016-06-19 DIAGNOSIS — L900 Lichen sclerosus et atrophicus: Secondary | ICD-10-CM

## 2016-06-19 NOTE — Progress Notes (Addendum)
Zyauna Domenico [birth year] [id number]        65 y.o.  G0P0  for annual exam.  Several issues noted below.  Past medical history,surgical history, problem list, medications, allergies, family history and social history were all reviewed and documented as reviewed in the EPIC chart.  ROS:  Performed with pertinent positives and negatives included in the history, assessment and plan.   Additional significant findings :  None   Exam: Caryn Bee assistant Vitals:   06/19/16 1050  BP: 118/74  Weight: 150 lb (68 kg)  Height: 5\' 6"  (1.676 m)   Body mass index is 24.21 kg/m.  General appearance:  Normal affect, orientation and appearance. Skin: Grossly normal HEENT: Without gross lesions.  No cervical or supraclavicular adenopathy. Thyroid normal.  Lungs:  Clear without wheezing, rales or rhonchi Cardiac: RR, without RMG Abdominal:  Soft, nontender, without masses, guarding, rebound, organomegaly or hernia Breasts:  Examined lying and sitting without masses, retractions, discharge or axillary adenopathy.Well-healed bilateral lumpectomy scars. Pelvic:  Ext, BUS, Vagina with atrophic changes. Classic lichen sclerosis changes lower labia majora bilaterally and perineal body to the perianal region.   Cervix atrophic  Uterus anteverted, normal size, shape and contour, midline and mobile nontender   Adnexa without masses or tenderness    Anus and perineum normal   Rectovaginal normal sphincter tone without palpated masses or tenderness.    Assessment/Plan:  65 y.o. G0P0 female for annual exam.   1. Postmenopausal/atrophic genital changes. Without significant hot flushes, night sweats, vaginal dryness or any vaginal bleeding. Continue to monitor and report any issues or bleeding. 2. Left lower quadrant/pelvic pain on and off over the last several months. Does not last long and is fleeting in nature. No nausea vomiting diarrhea constipation. Mother with history of breast cancer and  fallopian tube cancer. Patient with history of bilateral breast cancer. Underwent the panel for genetic screening and was negative. I discussed with her last year does not guarantee she does not carry an unknown gene and had recommended laparoscopic bilateral salpingo-oophorectomies as prophylactic surgery. Patient declined at that time. I again reviewed today and the patient again declines. We had also reviewed surveillance ultrasounds and CA-125 and she declined this last year. I recommend that we proceed with this this year because of her left-sided pain and she agrees with this. The issues of false positive and false negative were reviewed with her. Patient will follow up for the CA-125 and ultrasound results and we'll go from there. Will also check a urinalysis to rule out urology because of pain. If she changes her mind about prophylactic salpingo-oophorectomy she'll call me. Mammogram 12/2015. She'll continue with annual mammography and follow up with her oncologist. SBE monthly reviewed. 3. DEXA 2015. Being followed by Dr. Hilliard Clark she'll continue to follow up with him in reference to this. 4. Lichen sclerosis. Does have classic changes consistent with lichen sclerosis. Has prescription for clobetasol and uses this intermittently. I reviewed the proper use of this and the need to be aggressive whenever she develops symptoms but not to be on it all the time. She'll call me if she needs a refill of her clobetasol. 5. Colonoscopy 2015. Repeat at their recommended interval. 6. Pap smear/HPV 2015 negative. No Pap smear done today. No history of significant abnormal Pap smears previously. 7. Health maintenance. No routine lab work done as this is done at Dr. Raul Del office. Follow up for ultrasound and CA-125 results.  Greater than 10 minutes of my time  in excess of her routine gynecologic exam was spent in direct face to face counseling and coordination of care in regards to her problems of left lower quadrant  pain, fallopian tube/breast cancer history, lichen sclerosis.    Anastasio Auerbach MD, 11:33 AM 06/19/2016

## 2016-06-19 NOTE — Addendum Note (Signed)
Addended by: Nelva Nay on: 06/19/2016 01:03 PM   Modules accepted: Orders

## 2016-06-19 NOTE — Patient Instructions (Signed)
Follow up for ultrasound as scheduled 

## 2016-06-20 LAB — URINALYSIS W MICROSCOPIC + REFLEX CULTURE

## 2016-06-20 LAB — CA 125: CA 125: 9 U/mL (ref <35)

## 2016-06-28 ENCOUNTER — Encounter: Payer: Self-pay | Admitting: Gynecology

## 2016-06-28 ENCOUNTER — Ambulatory Visit (INDEPENDENT_AMBULATORY_CARE_PROVIDER_SITE_OTHER): Payer: 59

## 2016-06-28 ENCOUNTER — Ambulatory Visit (INDEPENDENT_AMBULATORY_CARE_PROVIDER_SITE_OTHER): Payer: 59 | Admitting: Gynecology

## 2016-06-28 VITALS — BP 120/76

## 2016-06-28 DIAGNOSIS — R102 Pelvic and perineal pain: Secondary | ICD-10-CM | POA: Diagnosis not present

## 2016-06-28 DIAGNOSIS — R1031 Right lower quadrant pain: Secondary | ICD-10-CM

## 2016-06-28 NOTE — Patient Instructions (Signed)
Follow up with your gastroenterologist if your abdominal discomfort continues. Follow up for your annual exam when due.

## 2016-06-28 NOTE — Progress Notes (Signed)
    Valerie Heath 1951/01/21 [id number]        65 y.o.  G0P0 presents for ultrasound. History of left lower quadrant/pelvic pain on and off for several months. History of bilateral breast cancer. Mother with breast cancer and fallopian tube cancer. Underwent genetic panel screening and was negative. Discussed and offered prophylactic something oophorectomy regardless negative genetic screening and patient has declined. CA-125 recently 9.  Past medical history,surgical history, problem list, medications, allergies, family history and social history were all reviewed and documented in the EPIC chart.  Directed ROS with pertinent positives and negatives documented in the history of present illness/assessment and plan.  Exam: Vitals:   06/28/16 1109  BP: 120/76   General appearance:  Normal  Ultrasound shows uterus normal size and echotexture. Endometrial echo 1.1 mm. Right and left ovaries visualized and normal consistent with postmenopausal. Cul-de-sac negative.  Assessment/Plan:  65 y.o. G0P0 with lower abdominal pain. CA-125 and ultrasound negative. Declines prophylactic something oophorectomy. Will follow up with gastroenterologist if her discomfort continues.    Anastasio Auerbach MD, 11:25 AM 06/28/2016

## 2016-07-28 ENCOUNTER — Other Ambulatory Visit: Payer: Self-pay | Admitting: Hematology and Oncology

## 2016-07-28 DIAGNOSIS — C50411 Malignant neoplasm of upper-outer quadrant of right female breast: Secondary | ICD-10-CM

## 2016-12-06 ENCOUNTER — Other Ambulatory Visit: Payer: Self-pay | Admitting: Internal Medicine

## 2016-12-06 DIAGNOSIS — Z853 Personal history of malignant neoplasm of breast: Secondary | ICD-10-CM

## 2017-01-13 ENCOUNTER — Other Ambulatory Visit: Payer: Self-pay | Admitting: Gynecology

## 2017-01-13 DIAGNOSIS — Z853 Personal history of malignant neoplasm of breast: Secondary | ICD-10-CM

## 2017-01-15 ENCOUNTER — Ambulatory Visit
Admission: RE | Admit: 2017-01-15 | Discharge: 2017-01-15 | Disposition: A | Discharge status: Home / Self Care | Payer: Managed Care, Other (non HMO) | Source: Ambulatory Visit | Attending: Internal Medicine | Admitting: Internal Medicine

## 2017-01-15 DIAGNOSIS — Z853 Personal history of malignant neoplasm of breast: Secondary | ICD-10-CM

## 2017-05-30 ENCOUNTER — Ambulatory Visit (HOSPITAL_BASED_OUTPATIENT_CLINIC_OR_DEPARTMENT_OTHER): Payer: 59 | Admitting: Hematology and Oncology

## 2017-05-30 VITALS — BP 136/76 | HR 71 | Temp 98.4°F | Resp 18 | Ht 66.0 in | Wt 153.1 lb

## 2017-05-30 DIAGNOSIS — Z79811 Long term (current) use of aromatase inhibitors: Secondary | ICD-10-CM | POA: Diagnosis not present

## 2017-05-30 DIAGNOSIS — C50412 Malignant neoplasm of upper-outer quadrant of left female breast: Secondary | ICD-10-CM

## 2017-05-30 DIAGNOSIS — Z170 Estrogen receptor positive status [ER+]: Secondary | ICD-10-CM

## 2017-05-30 DIAGNOSIS — D0511 Intraductal carcinoma in situ of right breast: Secondary | ICD-10-CM

## 2017-05-30 DIAGNOSIS — C50411 Malignant neoplasm of upper-outer quadrant of right female breast: Secondary | ICD-10-CM

## 2017-05-30 MED ORDER — ANASTROZOLE 1 MG PO TABS: 1.0000 mg | ORAL_TABLET | Freq: Every day | ORAL | 3 refills | Status: DC

## 2017-05-30 MED ORDER — LEVOTHYROXINE SODIUM 112 MCG PO TABS: 112.0000 ug | ORAL_TABLET | Freq: Every day | ORAL | Status: DC

## 2017-05-30 NOTE — Assessment & Plan Note (Signed)
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 Oncotype DX recurrence score was 8 which translates into a 6% risk of distant recurrence Status post radiation therapy completed November 2015, started anastrozole 08/02/2014  Anastrozole toxicities: Patient does not have any side effects from anastrozole.  Breast Cancer Surveillance: 1. Breast exam 05/30/2017: Normal 2. 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 breast exam and mammogram were reviewed and there were normal  Return to clinic in one year for follow-up

## 2017-05-30 NOTE — Progress Notes (Signed)
Patient Care Team: Marton Redwood, MD as PCP - General (Internal Medicine)  DIAGNOSIS:  Encounter Diagnoses  Name Primary?  . Malignant neoplasm of upper-outer quadrant of left breast in female, estrogen receptor positive ([place]) Yes  . Malignant neoplasm of upper-outer quadrant of right breast in female, estrogen receptor positive ([place])     SUMMARY OF ONCOLOGIC HISTORY: Oncology History   Grams mammogram in Arimidex     Breast cancer of upper-outer quadrant of right female breast ([place])   03/29/2014 Initial Diagnosis    MRI finding of suspicious changes      04/27/2014 Surgery    Right breast lumpectomy: Fibrocystic changes; 5 sentinel lymph nodes negative       Breast cancer of upper-outer quadrant of left female breast ([place])   03/29/2014 Initial Diagnosis    Breast cancer of upper-outer quadrant of left female breast      04/27/2014 Surgery    Left breast lumpectomy: Invasive ductal carcinoma grade 1/3; 1.5 cm with DCIS, ER 100%, PR 53%, Ki-67 8%, HER-2 negative ratio 1.21      06/14/2014 - 08/01/2014 Radiation Therapy    Adjuvant radiation therapy      07/26/2014 Procedure    [place]. Genetic testing was normal, and did not reveal a deleterious mutation in these genes. A complete list of all genes tested is located on the test report scanned into EPIC.        08/02/2014 -  Anti-estrogen oral therapy    Arimidex 1 mg daily X 5 years       CHIEF COMPLIANT: Follow-up of bilateral breast cancers are not Arimidex therapy  INTERVAL HISTORY: Valerie Heath is a 66 year old who underwent bilateral lumpectomies for bilateral breast cancers. She underwent radiation and is now on Arimidex therapy since November 2015. She appears to be tolerating Arimidex extremely well. She denies any major hot flashes or myalgias. She denies any lumps or nodules in the breast.  REVIEW OF SYSTEMS:   Constitutional: Denies fevers, chills or abnormal weight loss Eyes: Denies  blurriness of vision Ears, nose, mouth, throat, and face: Denies mucositis or sore throat Respiratory: Denies cough, dyspnea or wheezes Cardiovascular: Denies palpitation, chest discomfort Gastrointestinal:  Denies nausea, heartburn or change in bowel habits Skin: Denies abnormal skin rashes Lymphatics: Denies new lymphadenopathy or easy bruising Neurological:Denies numbness, tingling or new weaknesses Behavioral/Psych: Mood is stable, no new changes  Extremities: No lower extremity edema Breast:  denies any pain or lumps or nodules in either breasts All other systems were reviewed with the patient and are negative.  I have reviewed the past medical history, past surgical history, social history and family history with the patient and they are unchanged from previous note.  ALLERGIES:  is allergic to erythromycin.  MEDICATIONS:  Current Outpatient Prescriptions  Medication Sig Dispense Refill  . anastrozole (ARIMIDEX) 1 MG tablet TAKE 1 TABLET EVERY DAY 90 tablet 3  . Calcium Carbonate-Vitamin D (CALCIUM + D PO) Take 1 capsule by mouth daily.     . Cholecalciferol (VITAMIN D PO) Take 1 capsule by mouth daily.     Marland Kitchen levothyroxine (SYNTHROID) 112 MCG tablet Take 1 tablet (112 mcg total) by mouth daily before breakfast.    . valACYclovir (VALTREX) 500 MG tablet TAKE 4 TABLETS BY MOUTH ON START OF UCLERATION, THEN 4 TABLETS 12 HOURS LATER  3   No current facility-administered medications for this visit.     PHYSICAL EXAMINATION: ECOG PERFORMANCE STATUS: 1 - Symptomatic but completely ambulatory  Vitals:   05/30/17 1023  BP: 136/76  Pulse: 71  Resp: 18  Temp: 98.4 F (36.9 C)  SpO2: 99%   Filed Weights   05/30/17 1023  Weight: 153 lb 1.6 oz (69.4 kg)    GENERAL:alert, no distress and comfortable SKIN: skin color, texture, turgor are normal, no rashes or significant lesions EYES: normal, Conjunctiva are pink and non-injected, sclera clear OROPHARYNX:no exudate, no erythema  and lips, buccal mucosa, and tongue normal  NECK: supple, thyroid normal size, non-tender, without nodularity LYMPH:  no palpable lymphadenopathy in the cervical, axillary or inguinal LUNGS: clear to auscultation and percussion with normal breathing effort HEART: regular rate & rhythm and no murmurs and no lower extremity edema ABDOMEN:abdomen soft, non-tender and normal bowel sounds MUSCULOSKELETAL:no cyanosis of digits and no clubbing  NEURO: alert & oriented x 3 with fluent speech, no focal motor/sensory deficits EXTREMITIES: No lower extremity edema BREAST: No palpable masses or nodules in either right or left breasts. No palpable axillary supraclavicular or infraclavicular adenopathy no breast tenderness or nipple discharge. 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 Oncotype DX recurrence score was 8 which translates into a 6% risk of distant recurrence Status post radiation therapy completed November 2015, started anastrozole 08/02/2014  Anastrozole toxicities: Patient does not have any side effects from anastrozole.  Breast Cancer Surveillance: 1. Breast exam 05/30/2017: Normal 2. 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 breast exam and  mammogram were reviewed and there were normal  Return to clinic in one year for follow-up   I spent 25 minutes talking to the patient of which more than half was spent in counseling and coordination of care.  No orders of the defined types were placed in this encounter.  The patient has a good understanding of the overall plan. she agrees with it. she will call with any problems that may develop before the next visit here.   Rulon Eisenmenger, MD 05/30/17

## 2017-06-20 ENCOUNTER — Encounter: Payer: 59 | Admitting: Gynecology

## 2017-06-25 ENCOUNTER — Ambulatory Visit (INDEPENDENT_AMBULATORY_CARE_PROVIDER_SITE_OTHER): Payer: 59 | Admitting: Gynecology

## 2017-06-25 ENCOUNTER — Encounter: Payer: Self-pay | Admitting: Gynecology

## 2017-06-25 VITALS — BP 130/80 | Ht 64.0 in | Wt 153.0 lb

## 2017-06-25 DIAGNOSIS — C50911 Malignant neoplasm of unspecified site of right female breast: Secondary | ICD-10-CM

## 2017-06-25 DIAGNOSIS — C50912 Malignant neoplasm of unspecified site of left female breast: Secondary | ICD-10-CM | POA: Diagnosis not present

## 2017-06-25 DIAGNOSIS — Z23 Encounter for immunization: Secondary | ICD-10-CM

## 2017-06-25 DIAGNOSIS — Z01411 Encounter for gynecological examination (general) (routine) with abnormal findings: Secondary | ICD-10-CM

## 2017-06-25 DIAGNOSIS — N952 Postmenopausal atrophic vaginitis: Secondary | ICD-10-CM

## 2017-06-25 DIAGNOSIS — L900 Lichen sclerosus et atrophicus: Secondary | ICD-10-CM

## 2017-06-25 NOTE — Progress Notes (Signed)
    Valerie Heath Dec 06, 1950 [id number]        66 y.o.  G0P0 for annual gynecologic exam.    Past medical history,surgical history, problem list, medications, allergies, family history and social history were all reviewed and documented as reviewed in the EPIC chart.  ROS:  Performed with pertinent positives and negatives included in the history, assessment and plan.   Additional significant findings :  None   Exam: Caryn Bee assistant Vitals:   06/25/17 1401  BP: 130/80  Weight: 153 lb (69.4 kg)  Height: 5\' 4"  (1.626 m)   Body mass index is 26.26 kg/m.  General appearance:  Normal affect, orientation and appearance. Skin: Grossly normal HEENT: Without gross lesions.  No cervical or supraclavicular adenopathy. Thyroid normal.  Lungs:  Clear without wheezing, rales or rhonchi Cardiac: RR, without RMG Abdominal:  Soft, nontender, without masses, guarding, rebound, organomegaly or hernia Breasts:  Examined lying and sitting without masses, retractions, discharge or axillary adenopathy.  Well-healed bilateral lumpectomy scars Pelvic:  Ext, BUS, Vagina: With atrophic changes. Classic lichen sclerosus changes lower labia majora and perineal body to the perianal region.  Cervix: With atrophic changes  Uterus: Anteverted, normal size, shape and contour, midline and mobile nontender   Adnexa: Without masses or tenderness    Anus and perineum: Normal   Rectovaginal: Normal sphincter tone without palpated masses or tenderness.    Assessment/Plan:  66 y.o. G0P0 female for annual gynecologic exam.  1. Postmenopausal/atrophic genital changes. No significant hot flushes, night sweats, vaginal dryness or any vaginal bleeding. Continue monitor report any issues or bleeding. 2. History of bilateral breast cancer. Genetic screening reported negative. Discussed and offered bilateral prophylactic salpingo-oophorectomies patient declined. Had ultrasound last year due to some discomfort which was  normal and had a negative CA-125. Options to proceed with prophylactic salpingo-oophorectomy for continued ultrasound/CA-125 monitoring discussed. Patient declined. 3. History of bilateral breast cancer. Mammography 12/2016. Continues on Arimidex. Continues to follow up with her oncologist. Exam NED. 4. Colonoscopy 2015. Repeat at their recommended interval. 5. Pap smear/HPV 2015. No Pap smear done today. No history of abnormal Pap smears previously. 6. DEXA 2017. Being followed by Dr. Brigitte Pulse and she'll continue to follow up with them in reference to this. 7. History of lichen sclerosus. Uses Temovate cream intermittently as needed for symptoms. Is doing well with this. Exam consistent with lichen sclerosus. No concerning changes. 8. Health maintenance. No routine lab work done as patient does this elsewhere. Follow up 1 year, sooner as needed.     Anastasio Auerbach MD, 2:27 PM 06/25/2017

## 2017-06-25 NOTE — Patient Instructions (Signed)
Followup in one year for annual exam, sooner if any issues 

## 2017-12-04 ENCOUNTER — Other Ambulatory Visit: Payer: Self-pay | Admitting: Gynecology

## 2017-12-04 DIAGNOSIS — Z853 Personal history of malignant neoplasm of breast: Secondary | ICD-10-CM

## 2018-01-14 ENCOUNTER — Other Ambulatory Visit: Payer: Self-pay | Admitting: Gynecology

## 2018-01-14 ENCOUNTER — Other Ambulatory Visit: Payer: Self-pay

## 2018-01-14 DIAGNOSIS — Z853 Personal history of malignant neoplasm of breast: Secondary | ICD-10-CM

## 2018-01-16 ENCOUNTER — Ambulatory Visit
Admission: RE | Admit: 2018-01-16 | Discharge: 2018-01-16 | Disposition: A | Discharge status: Home / Self Care | Payer: 59 | Source: Ambulatory Visit | Attending: Gynecology | Admitting: Gynecology

## 2018-01-16 DIAGNOSIS — Z853 Personal history of malignant neoplasm of breast: Secondary | ICD-10-CM

## 2018-01-16 HISTORY — DX: Personal history of irradiation: Z92.3

## 2018-01-16 HISTORY — DX: Malignant neoplasm of unspecified site of unspecified female breast: C50.919

## 2018-03-02 DIAGNOSIS — M25561 Pain in right knee: Secondary | ICD-10-CM | POA: Insufficient documentation

## 2018-04-15 ENCOUNTER — Telehealth: Payer: Self-pay | Admitting: Hematology and Oncology

## 2018-04-15 NOTE — Telephone Encounter (Signed)
Patient called to reschedule  °

## 2018-06-01 ENCOUNTER — Ambulatory Visit: Payer: 59 | Admitting: Hematology and Oncology

## 2018-06-08 NOTE — Assessment & Plan Note (Signed)
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 Oncotype DX recurrence score was 8 which translates into a 6% risk of distant recurrence Status post radiation therapy completed November 2015, started anastrozole 08/02/2014  Anastrozole toxicities: Patient does not have any side effects from anastrozole.  Breast Cancer Surveillance: 1. Breast exam 06/09/2018: Normal 2. Mammogram04/26/2019: No mammographic evidence of malignancy breast density category C

## 2018-06-08 NOTE — Assessment & Plan Note (Signed)
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 breast exam and mammogram were reviewed and they were normal  Return to clinic in one year for follow-up

## 2018-06-09 ENCOUNTER — Inpatient Hospital Stay: Payer: 59 | Attending: Hematology and Oncology | Admitting: Hematology and Oncology

## 2018-06-09 ENCOUNTER — Telehealth: Payer: Self-pay | Admitting: Hematology and Oncology

## 2018-06-09 DIAGNOSIS — M8580 Other specified disorders of bone density and structure, unspecified site: Secondary | ICD-10-CM | POA: Diagnosis not present

## 2018-06-09 DIAGNOSIS — C50412 Malignant neoplasm of upper-outer quadrant of left female breast: Secondary | ICD-10-CM | POA: Insufficient documentation

## 2018-06-09 DIAGNOSIS — C50411 Malignant neoplasm of upper-outer quadrant of right female breast: Secondary | ICD-10-CM

## 2018-06-09 DIAGNOSIS — Z170 Estrogen receptor positive status [ER+]: Secondary | ICD-10-CM | POA: Diagnosis not present

## 2018-06-09 DIAGNOSIS — Z923 Personal history of irradiation: Secondary | ICD-10-CM

## 2018-06-09 DIAGNOSIS — D0511 Intraductal carcinoma in situ of right breast: Secondary | ICD-10-CM | POA: Diagnosis not present

## 2018-06-09 DIAGNOSIS — Z79811 Long term (current) use of aromatase inhibitors: Secondary | ICD-10-CM

## 2018-06-09 MED ORDER — VITAMIN D3 125 MCG (5000 UT) PO TABS: 1.0000 | ORAL_TABLET | Freq: Every day | ORAL | Status: DC

## 2018-06-09 MED ORDER — ANASTROZOLE 1 MG PO TABS: 1.0000 mg | ORAL_TABLET | Freq: Every day | ORAL | 3 refills | Status: DC

## 2018-06-09 NOTE — Telephone Encounter (Signed)
Gave avs and calendar ° °

## 2018-06-09 NOTE — Progress Notes (Signed)
Patient Care Team: Marton Redwood, MD as PCP - General (Internal Medicine)  DIAGNOSIS:  Encounter Diagnoses  Name Primary?  . Malignant neoplasm of upper-outer quadrant of right breast in female, estrogen receptor positive ([place])   . Malignant neoplasm of upper-outer quadrant of left breast in female, estrogen receptor positive ([place])    SUMMARY OF ONCOLOGIC HISTORY:   Breast cancer of upper-outer quadrant of right female breast ([place])   03/29/2014 Initial Diagnosis    MRI finding of suspicious changes    04/27/2014 Surgery    Right breast lumpectomy: Fibrocystic changes; 5 sentinel lymph nodes negative     Breast cancer of upper-outer quadrant of left female breast ([place])   03/29/2014 Initial Diagnosis    Breast cancer of upper-outer quadrant of left female breast    04/27/2014 Surgery    Left breast lumpectomy: Invasive ductal carcinoma grade 1/3; 1.5 cm with DCIS, ER 100%, PR 53%, Ki-67 8%, HER-2 negative ratio 1.21    06/14/2014 - 08/01/2014 Radiation Therapy    Adjuvant radiation therapy    07/26/2014 Procedure    [place]. Genetic testing was normal, and did not reveal a deleterious mutation in these genes. A complete list of all genes tested is located on the test report scanned into EPIC.      08/02/2014 -  Anti-estrogen oral therapy    Arimidex 1 mg daily X 5 years     CHIEF COMPLIANT: Follow-up on Arimidex therapy  INTERVAL HISTORY: Valerie Heath is a 67 year old with above-mentioned history of left breast cancer underwent lumpectomy radiation is currently on oral antiestrogen therapy with anastrozole.  She is tolerating anastrozole extremely well.  She denies any hot flashes or myalgias.  She has osteopenia which is being monitored by her primary care physician.  She spent a couple of weeks in [place] and enjoyed golf tennis and wonderful time.  REVIEW OF SYSTEMS:   Constitutional: Denies fevers, chills or abnormal weight loss Eyes: Denies blurriness of  vision Ears, nose, mouth, throat, and face: Denies mucositis or sore throat Respiratory: Denies cough, dyspnea or wheezes Cardiovascular: Denies palpitation, chest discomfort Gastrointestinal:  Denies nausea, heartburn or change in bowel habits Skin: Denies abnormal skin rashes Lymphatics: Denies new lymphadenopathy or easy bruising Neurological:Denies numbness, tingling or new weaknesses Behavioral/Psych: Mood is stable, no new changes  Extremities: No lower extremity edema  All other systems were reviewed with the patient and are negative.  I have reviewed the past medical history, past surgical history, social history and family history with the patient and they are unchanged from previous note.  ALLERGIES:  is allergic to erythromycin.  MEDICATIONS:  Current Outpatient Medications  Medication Sig Dispense Refill  . anastrozole (ARIMIDEX) 1 MG tablet Take 1 tablet (1 mg total) by mouth daily. 90 tablet 3  . Cholecalciferol (VITAMIN D PO) Take 1 capsule by mouth daily.     Marland Kitchen levothyroxine (SYNTHROID) 112 MCG tablet Take 1 tablet (112 mcg total) by mouth daily before breakfast. (Patient taking differently: Take 100 mcg by mouth daily before breakfast. )    . valACYclovir (VALTREX) 500 MG tablet TAKE 4 TABLETS BY MOUTH ON START OF UCLERATION, THEN 4 TABLETS 12 HOURS LATER  3   No current facility-administered medications for this visit.     PHYSICAL EXAMINATION: ECOG PERFORMANCE STATUS: 1 - Symptomatic but completely ambulatory  Vitals:   06/09/18 1038  BP: (!) 155/85  Pulse: 69  Temp: 98.2 F (36.8 C)  SpO2: 100%   Filed Weights  06/09/18 1038  Weight: 152 lb (68.9 kg)    GENERAL:alert, no distress and comfortable SKIN: skin color, texture, turgor are normal, no rashes or significant lesions EYES: normal, Conjunctiva are pink and non-injected, sclera clear OROPHARYNX:no exudate, no erythema and lips, buccal mucosa, and tongue normal  NECK: supple, thyroid normal  size, non-tender, without nodularity LYMPH:  no palpable lymphadenopathy in the cervical, axillary or inguinal LUNGS: clear to auscultation and percussion with normal breathing effort HEART: regular rate & rhythm and no murmurs and no lower extremity edema ABDOMEN:abdomen soft, non-tender and normal bowel sounds MUSCULOSKELETAL:no cyanosis of digits and no clubbing  NEURO: alert & oriented x 3 with fluent speech, no focal motor/sensory deficits EXTREMITIES: No lower extremity edema    LABORATORY DATA:  I have reviewed the data as listed No flowsheet data found.  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 Oncotype DX recurrence score was 8 which translates into a 6% risk of distant recurrence Status post radiation therapy completed November 2015, started anastrozole 08/02/2014  Anastrozole toxicities: Patient does not have any side effects from anastrozole.  Breast Cancer Surveillance: 1. Breast exam 06/09/2018: Normal 2. Mammogram04/26/2019: No mammographic evidence of malignancy breast density category C  Breast cancer of upper-outer quadrant of left female breast Right breast DCIS ER/PR positive: status post lumpectomy ER/PR positive: Currently on Arimidex. breast exam and mammogram were reviewed and they were normal  Return to clinic in one year for follow-up    No orders of the defined types were placed in this encounter.  The patient has a good understanding of the overall plan. she agrees with it. she will call with any problems that may develop before the next visit here.   Harriette Ohara, MD 06/09/18

## 2018-06-26 ENCOUNTER — Ambulatory Visit (INDEPENDENT_AMBULATORY_CARE_PROVIDER_SITE_OTHER): Payer: 59 | Admitting: Gynecology

## 2018-06-26 ENCOUNTER — Encounter: Payer: Self-pay | Admitting: Gynecology

## 2018-06-26 VITALS — BP 118/76 | Ht 64.0 in | Wt 152.0 lb

## 2018-06-26 DIAGNOSIS — Z1151 Encounter for screening for human papillomavirus (HPV): Secondary | ICD-10-CM

## 2018-06-26 DIAGNOSIS — Z853 Personal history of malignant neoplasm of breast: Secondary | ICD-10-CM

## 2018-06-26 DIAGNOSIS — N952 Postmenopausal atrophic vaginitis: Secondary | ICD-10-CM | POA: Diagnosis not present

## 2018-06-26 DIAGNOSIS — L900 Lichen sclerosus et atrophicus: Secondary | ICD-10-CM | POA: Diagnosis not present

## 2018-06-26 DIAGNOSIS — Z01419 Encounter for gynecological examination (general) (routine) without abnormal findings: Secondary | ICD-10-CM | POA: Diagnosis not present

## 2018-06-26 NOTE — Addendum Note (Signed)
Addended by: Nelva Nay on: 06/26/2018 12:21 PM   Modules accepted: Orders

## 2018-06-26 NOTE — Progress Notes (Signed)
    Valerie Heath [birth year] [id number]        67 y.o.  G0P0 for annual gynecologic exam.  Without gynecologic complaints  Past medical history,surgical history, problem list, medications, allergies, family history and social history were all reviewed and documented as reviewed in the EPIC chart.  ROS:  Performed with pertinent positives and negatives included in the history, assessment and plan.   Additional significant findings : None   Exam: Caryn Bee assistant Vitals:   06/26/18 1139  BP: 118/76  Weight: 152 lb (68.9 kg)  Height: 5\' 4"  (1.626 m)   Body mass index is 26.09 kg/m.  General appearance:  Normal affect, orientation and appearance. Skin: Grossly normal HEENT: Without gross lesions.  No cervical or supraclavicular adenopathy. Thyroid normal.  Lungs:  Clear without wheezing, rales or rhonchi Cardiac: RR, without RMG Abdominal:  Soft, nontender, without masses, guarding, rebound, organomegaly or hernia Breasts:  Examined lying and sitting without masses, retractions, discharge or axillary adenopathy.  Well-healed bilateral lumpectomy scars Pelvic:  Ext, BUS, Vagina: Mild changes lower labia/perineal body consistent with lichen sclerosis.  Cervix: With atrophic changes.  Pap smear/HPV  Uterus: Anteverted, normal size, shape and contour, midline and mobile nontender   Adnexa: Without masses or tenderness    Anus and perineum: Normal   Rectovaginal: Normal sphincter tone without palpated masses or tenderness.    Assessment/Plan:  67 y.o. G0P0 female for annual gynecologic exam.   1. Postmenopausal/atrophic genital changes.  No significant menopausal symptoms or any vaginal bleeding. 2. History of bilateral breast cancer.  Genetic screening panel negative.  Exam today NED.  Mammography 12/2017.  Continues on anastrozole.  Continue to follow-up with oncology.  We again discussed her mother's history of pelvic cancer and options for management to include prophylactic  salpingo-oophorectomy, ultrasound/CA 125 screening.  Patient not interested in pursuing anything at this time. 3. Pap smear/HPV 2015.  Pap smear/HPV today.  No history of significant abnormal Pap smears. 4. Colonoscopy coming due when she is going to call to schedule. 5. DEXA reported 2017.  Followed by Dr. Brigitte Pulse.  States that they are planning to repeat this this coming year.  We will follow-up with him in reference to bone health. 6. Lichen sclerosis.  Uses Temovate cream occasionally for symptomatic relief.  Has supply at home but will call when needs more. 7. Health maintenance.  No routine lab work done as patient reports this done elsewhere.  Follow-up 1 year, sooner as needed.   Anastasio Auerbach MD, 12:12 PM 06/26/2018

## 2018-06-26 NOTE — Patient Instructions (Signed)
Follow-up in 1 year, sooner as needed. 

## 2018-07-01 LAB — PAP IG AND HPV HIGH-RISK: HPV DNA High Risk: NOT DETECTED

## 2018-12-07 ENCOUNTER — Other Ambulatory Visit: Payer: Self-pay | Admitting: Gynecology

## 2018-12-07 DIAGNOSIS — Z1231 Encounter for screening mammogram for malignant neoplasm of breast: Secondary | ICD-10-CM

## 2019-01-14 ENCOUNTER — Other Ambulatory Visit: Payer: Self-pay | Admitting: Gynecology

## 2019-01-14 DIAGNOSIS — Z853 Personal history of malignant neoplasm of breast: Secondary | ICD-10-CM

## 2019-01-18 ENCOUNTER — Other Ambulatory Visit: Payer: Self-pay

## 2019-01-18 ENCOUNTER — Ambulatory Visit
Admission: RE | Admit: 2019-01-18 | Discharge: 2019-01-18 | Disposition: A | Discharge status: Home / Self Care | Payer: 59 | Source: Ambulatory Visit | Attending: Gynecology | Admitting: Gynecology

## 2019-01-18 DIAGNOSIS — Z853 Personal history of malignant neoplasm of breast: Secondary | ICD-10-CM

## 2019-06-01 ENCOUNTER — Telehealth: Payer: Self-pay | Admitting: Hematology and Oncology

## 2019-06-01 NOTE — Telephone Encounter (Signed)
Returned patient's phone call regarding rescheduling an appointment, left a voicemail. 

## 2019-06-01 NOTE — Telephone Encounter (Signed)
Patient returned phone call regarding voicemail that was left, per patient's request 09/29 appointment has been moved to 10/05 due to patient being out of town.

## 2019-06-10 ENCOUNTER — Ambulatory Visit: Payer: 59 | Admitting: Hematology and Oncology

## 2019-06-22 ENCOUNTER — Ambulatory Visit: Payer: 59 | Admitting: Hematology and Oncology

## 2019-06-27 NOTE — Progress Notes (Signed)
Patient Care Team: Marton Redwood, MD as PCP - General (Internal Medicine)  DIAGNOSIS:    ICD-10-CM   1. Malignant neoplasm of upper-outer quadrant of right breast in female, estrogen receptor positive ([place])  C50.411    Z17.0   2. Malignant neoplasm of upper-outer quadrant of left breast in female, estrogen receptor positive ([place])  C50.412    Z17.0     SUMMARY OF ONCOLOGIC HISTORY: Oncology History  Breast cancer of upper-outer quadrant of right female breast ([place])  03/29/2014 Initial Diagnosis   MRI finding of suspicious changes   04/27/2014 Surgery   Right breast lumpectomy: Fibrocystic changes; 5 sentinel lymph nodes negative   Breast cancer of upper-outer quadrant of left female breast ([place])  03/29/2014 Initial Diagnosis   Breast cancer of upper-outer quadrant of left female breast   04/27/2014 Surgery   Left breast lumpectomy: Invasive ductal carcinoma grade 1/3; 1.5 cm with DCIS, ER 100%, PR 53%, Ki-67 8%, HER-2 negative ratio 1.21   06/14/2014 - 08/01/2014 Radiation Therapy   Adjuvant radiation therapy   07/26/2014 Procedure   [place]. Genetic testing was normal, and did not reveal a deleterious mutation in these genes. A complete list of all genes tested is located on the test report scanned into EPIC.     08/02/2014 -  Anti-estrogen oral therapy   Arimidex 1 mg daily X 5 years     CHIEF COMPLIANT: Follow-up on Arimidex therapy  INTERVAL HISTORY: Valerie Heath is a 68 y.o. with above-mentioned history of left breast cancer underwent lumpectomy, radiation, and who is currently on oral antiestrogen therapy with anastrozole. I last saw her a year ago. Mammogram on 01/18/19 showed no evidence of malignancy bilaterally. She presents to the clinic today for annual follow-up.   REVIEW OF SYSTEMS:   Constitutional: Denies fevers, chills or abnormal weight loss Eyes: Denies blurriness of vision Ears, nose, mouth, throat, and face: Denies mucositis or sore throat  Respiratory: Denies cough, dyspnea or wheezes Cardiovascular: Denies palpitation, chest discomfort Gastrointestinal: Denies nausea, heartburn or change in bowel habits Skin: Denies abnormal skin rashes Lymphatics: Denies new lymphadenopathy or easy bruising Neurological: Denies numbness, tingling or new weaknesses Behavioral/Psych: Mood is stable, no new changes  Extremities: No lower extremity edema Breast: denies any pain or lumps or nodules in either breasts All other systems were reviewed with the patient and are negative.  I have reviewed the past medical history, past surgical history, social history and family history with the patient and they are unchanged from previous note.  ALLERGIES:  is allergic to erythromycin.  MEDICATIONS:  Current Outpatient Medications  Medication Sig Dispense Refill  . anastrozole (ARIMIDEX) 1 MG tablet Take 1 tablet (1 mg total) by mouth daily. 90 tablet 3  . Cholecalciferol (VITAMIN D3) 5000 units TABS Take 1 tablet (5,000 Units total) by mouth daily. 30 tablet   . levothyroxine (SYNTHROID) 112 MCG tablet Take 1 tablet (112 mcg total) by mouth daily before breakfast. (Patient taking differently: Take 100 mcg by mouth daily before breakfast. )    . valACYclovir (VALTREX) 500 MG tablet TAKE 4 TABLETS BY MOUTH ON START OF UCLERATION, THEN 4 TABLETS 12 HOURS LATER  3   No current facility-administered medications for this visit.     PHYSICAL EXAMINATION: ECOG PERFORMANCE STATUS: 1 - Symptomatic but completely ambulatory  There were no vitals filed for this visit. There were no vitals filed for this visit.  GENERAL: alert, no distress and comfortable SKIN: skin color, texture, turgor  are normal, no rashes or significant lesions EYES: normal, Conjunctiva are pink and non-injected, sclera clear OROPHARYNX: no exudate, no erythema and lips, buccal mucosa, and tongue normal  NECK: supple, thyroid normal size, non-tender, without nodularity LYMPH: no  palpable lymphadenopathy in the cervical, axillary or inguinal LUNGS: clear to auscultation and percussion with normal breathing effort HEART: regular rate & rhythm and no murmurs and no lower extremity edema ABDOMEN: abdomen soft, non-tender and normal bowel sounds MUSCULOSKELETAL: no cyanosis of digits and no clubbing  NEURO: alert & oriented x 3 with fluent speech, no focal motor/sensory deficits EXTREMITIES: No lower extremity edema BREAST: No palpable masses or nodules in either right or left breasts. No palpable axillary supraclavicular or infraclavicular adenopathy no breast tenderness or nipple discharge. (exam performed in the presence of a chaperone)  LABORATORY DATA:  I have reviewed the data as listed No flowsheet data found.  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 Oncotype DX recurrence score was 8 which translates into a 6% risk of distant recurrence Status post radiation therapy completed November 2015, started anastrozole 08/02/2014  Anastrozole toxicities: Patient does not have any side effects from anastrozole. We will continue anastrozole for 2 more years.  Breast Cancer Surveillance: 1. Breast exam10/01/2019: Normal 2. Mammogram4/27/2020: No mammographic evidence of malignancy breast density category C  Breast cancer of upper-outer quadrant of left female breast Right breast DCIS ER/PR positive: status post lumpectomy ER/PR positive: Currently on Arimidex. breast exam and mammogram were reviewed and they were normal  Return to clinic in one year for follow-up  No orders of the defined types were placed in this encounter.  The patient has a good understanding of  the overall plan. she agrees with it. she will call with any problems that may develop before the next visit here.  Nicholas Lose, MD 06/28/2019  Julious Oka Dorshimer am acting as scribe for Dr. Nicholas Lose.  I have reviewed the above documentation for accuracy and completeness, and I agree with the above.

## 2019-06-28 ENCOUNTER — Other Ambulatory Visit: Payer: Self-pay

## 2019-06-28 ENCOUNTER — Inpatient Hospital Stay: Payer: 59 | Attending: Hematology and Oncology | Admitting: Hematology and Oncology

## 2019-06-28 VITALS — BP 161/89 | HR 71 | Temp 98.2°F | Resp 18 | Ht 64.0 in | Wt 146.3 lb

## 2019-06-28 DIAGNOSIS — C50411 Malignant neoplasm of upper-outer quadrant of right female breast: Secondary | ICD-10-CM | POA: Diagnosis not present

## 2019-06-28 DIAGNOSIS — Z923 Personal history of irradiation: Secondary | ICD-10-CM | POA: Insufficient documentation

## 2019-06-28 DIAGNOSIS — Z79811 Long term (current) use of aromatase inhibitors: Secondary | ICD-10-CM | POA: Diagnosis not present

## 2019-06-28 DIAGNOSIS — D0511 Intraductal carcinoma in situ of right breast: Secondary | ICD-10-CM | POA: Diagnosis not present

## 2019-06-28 DIAGNOSIS — Z170 Estrogen receptor positive status [ER+]: Secondary | ICD-10-CM

## 2019-06-28 DIAGNOSIS — C50412 Malignant neoplasm of upper-outer quadrant of left female breast: Secondary | ICD-10-CM

## 2019-06-28 MED ORDER — ANASTROZOLE 1 MG PO TABS: 1.0000 mg | ORAL_TABLET | Freq: Every day | ORAL | 3 refills | Status: DC

## 2019-06-28 NOTE — Assessment & Plan Note (Signed)
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 Oncotype DX recurrence score was 8 which translates into a 6% risk of distant recurrence Status post radiation therapy completed November 2015, started anastrozole 08/02/2014  Anastrozole toxicities: Patient does not have any side effects from anastrozole.  Breast Cancer Surveillance: 1. Breast exam10/01/2019: Normal 2. Mammogram4/27/2020: No mammographic evidence of malignancy breast density category C  Breast cancer of upper-outer quadrant of left female breast Right breast DCIS ER/PR positive: status post lumpectomy ER/PR positive: Currently on Arimidex. breast exam and mammogram were reviewed and they were normal  Return to clinic in one year for follow-up

## 2019-06-30 ENCOUNTER — Encounter: Payer: Self-pay | Admitting: Gynecology

## 2019-06-30 ENCOUNTER — Other Ambulatory Visit: Payer: Self-pay

## 2019-07-01 ENCOUNTER — Ambulatory Visit (INDEPENDENT_AMBULATORY_CARE_PROVIDER_SITE_OTHER): Payer: 59 | Admitting: Gynecology

## 2019-07-01 ENCOUNTER — Encounter: Payer: Self-pay | Admitting: Gynecology

## 2019-07-01 VITALS — BP 124/82 | Ht 65.0 in | Wt 147.0 lb

## 2019-07-01 DIAGNOSIS — N952 Postmenopausal atrophic vaginitis: Secondary | ICD-10-CM

## 2019-07-01 DIAGNOSIS — L900 Lichen sclerosus et atrophicus: Secondary | ICD-10-CM | POA: Diagnosis not present

## 2019-07-01 DIAGNOSIS — Z01419 Encounter for gynecological examination (general) (routine) without abnormal findings: Secondary | ICD-10-CM

## 2019-07-01 DIAGNOSIS — Z853 Personal history of malignant neoplasm of breast: Secondary | ICD-10-CM

## 2019-07-01 MED ORDER — CLOBETASOL PROPIONATE 0.05 % EX CREA: TOPICAL_CREAM | CUTANEOUS | 1 refills | Status: DC

## 2019-07-01 NOTE — Patient Instructions (Signed)
Call if you want to pursue ultrasound or CA 125 testing.  Check with Dr. Brigitte Pulse about arranging for a bone density.  Follow-up in 1 year for annual exam

## 2019-07-01 NOTE — Progress Notes (Signed)
    Valerie Heath 10/23/1950 [id number]        68 y.o.  G0P0 for annual gynecologic exam.  Is having more itching with her lichen sclerosus.  Is out of Temovate cream  Past medical history,surgical history, problem list, medications, allergies, family history and social history were all reviewed and documented as reviewed in the EPIC chart.  ROS:  Performed with pertinent positives and negatives included in the history, assessment and plan.   Additional significant findings : None   Exam: Caryn Bee assistant Vitals:   07/01/19 1038  BP: 124/82  Weight: 147 lb (66.7 kg)  Height: 5\' 5"  (1.651 m)   Body mass index is 24.46 kg/m.  General appearance:  Normal affect, orientation and appearance. Skin: Grossly normal HEENT: Without gross lesions.  No cervical or supraclavicular adenopathy. Thyroid normal.  Lungs:  Clear without wheezing, rales or rhonchi Cardiac: RR, without RMG Abdominal:  Soft, nontender, without masses, guarding, rebound, organomegaly or hernia Breasts:  Examined lying and sitting without masses, retractions, discharge or axillary adenopathy.  Well-healed bilateral lumpectomy scars Pelvic:  Ext, BUS, Vagina: With atrophic changes.  Blanching of the perineal body consistent with history of lichen sclerosis  Cervix: With atrophic changes  Uterus: Anteverted, normal size, shape and contour, midline and mobile nontender   Adnexa: Without masses or tenderness    Anus and perineum: Normal   Rectovaginal: Normal sphincter tone without palpated masses or tenderness.    Assessment/Plan:  68 y.o. G0P0 female for annual gynecologic exam.   1. Lichen sclerosis, biopsy proven.  Clobetasol 0.05% cream refill provided.  Will apply nightly for 2 to 4 weeks and then taper.  Will use intermittently as needed. 2. Postmenopausal.  No significant menopausal symptoms or any vaginal bleeding. 3. History of bilateral breast cancer.  Maternal history of pelvic cancer.  Underwent  genetic screening which was negative.  We again discussed the issues that she may have a gene not tested for in the options to proceed with prophylactic salpingo-oophorectomy as well as ultrasound and CA 125 screening.  False positive and false negative issues with ultrasound and CA 125 screening discussed.  At this point the patient is not interested in doing anything excepting the risks of missed disease.  She will call back if she changes her mind.  Mammography 12/2018.  Exam today NED.  Continue to follow-up with oncology.  Continue on anastrozole at their recommendation. 4. DEXA reported 2017 at Dr. Raul Del office.  Discussed increased risks for bone loss on anastrozole and I recommended she follow-up with Dr. Brigitte Pulse and ask about scheduling a bone density. 5. Colonoscopy 2015.  Repeat at their recommended interval. 6. Pap smear/HPV 2019.  No Pap smear done today.  No history of abnormal Pap smears previously.  Options to stop screening per current screening guidelines based on age discussed.  Will readdress on an annual basis. 7. Health maintenance.  No routine lab work done as patient does this elsewhere.  Follow-up 1 year, sooner as needed.   Anastasio Auerbach MD, 11:09 AM 07/01/2019

## 2019-11-18 ENCOUNTER — Other Ambulatory Visit: Payer: Self-pay | Admitting: Internal Medicine

## 2019-11-18 DIAGNOSIS — Z8249 Family history of ischemic heart disease and other diseases of the circulatory system: Secondary | ICD-10-CM

## 2019-11-18 DIAGNOSIS — E785 Hyperlipidemia, unspecified: Secondary | ICD-10-CM

## 2019-11-29 ENCOUNTER — Ambulatory Visit
Admission: RE | Admit: 2019-11-29 | Discharge: 2019-11-29 | Disposition: A | Discharge status: Home / Self Care | Payer: No Typology Code available for payment source | Source: Ambulatory Visit | Attending: Internal Medicine | Admitting: Internal Medicine

## 2019-11-29 DIAGNOSIS — E785 Hyperlipidemia, unspecified: Secondary | ICD-10-CM

## 2019-11-29 DIAGNOSIS — Z8249 Family history of ischemic heart disease and other diseases of the circulatory system: Secondary | ICD-10-CM

## 2019-12-08 ENCOUNTER — Other Ambulatory Visit: Payer: Self-pay | Admitting: Obstetrics and Gynecology

## 2019-12-08 DIAGNOSIS — Z1231 Encounter for screening mammogram for malignant neoplasm of breast: Secondary | ICD-10-CM

## 2020-01-19 ENCOUNTER — Ambulatory Visit
Admission: RE | Admit: 2020-01-19 | Discharge: 2020-01-19 | Disposition: A | Discharge status: Home / Self Care | Payer: 59 | Source: Ambulatory Visit | Attending: Obstetrics and Gynecology | Admitting: Obstetrics and Gynecology

## 2020-01-19 ENCOUNTER — Other Ambulatory Visit: Payer: Self-pay

## 2020-01-19 ENCOUNTER — Other Ambulatory Visit: Payer: Self-pay | Admitting: Obstetrics and Gynecology

## 2020-01-19 DIAGNOSIS — Z1231 Encounter for screening mammogram for malignant neoplasm of breast: Secondary | ICD-10-CM

## 2020-01-19 DIAGNOSIS — N644 Mastodynia: Secondary | ICD-10-CM

## 2020-01-19 DIAGNOSIS — R234 Changes in skin texture: Secondary | ICD-10-CM

## 2020-01-21 ENCOUNTER — Other Ambulatory Visit: Payer: Self-pay

## 2020-01-24 ENCOUNTER — Encounter: Payer: Self-pay | Admitting: Nurse Practitioner

## 2020-01-24 ENCOUNTER — Ambulatory Visit: Payer: 59 | Admitting: Nurse Practitioner

## 2020-01-24 ENCOUNTER — Other Ambulatory Visit: Payer: Self-pay

## 2020-01-24 VITALS — BP 144/82

## 2020-01-24 DIAGNOSIS — Z853 Personal history of malignant neoplasm of breast: Secondary | ICD-10-CM

## 2020-01-24 DIAGNOSIS — N644 Mastodynia: Secondary | ICD-10-CM

## 2020-01-24 DIAGNOSIS — Z170 Estrogen receptor positive status [ER+]: Secondary | ICD-10-CM

## 2020-01-24 DIAGNOSIS — C50412 Malignant neoplasm of upper-outer quadrant of left female breast: Secondary | ICD-10-CM

## 2020-01-24 NOTE — Progress Notes (Signed)
   Acute Office Visit  Subjective:    Patient ID: Valerie Heath, female    DOB: 06/10/1951, 69 y.o.   MRN: [id number]  Chief Complaint  Patient presents with  . Breast Problem    mammo 12/2019,     HPI 69 year old MWF G0 presents today for bilateral breast pain that is described as sore, pain level 2/10. Uses warm compresses with good relief. This is not new for her and began after bilateral lumpectomy and radiation treatment in 2015 due to bilateral breast cancer.  Had mammogram last week, we do not have those records yet.  Patient has developed a thickening of scar tissue on both breasts that may obscure some imaging on 3D mammogram. Sees oncology, taking anastrozole per their recommendation.    Review of Systems  Constitutional: Negative.   Respiratory: Negative.   Cardiovascular: Negative.   Gastrointestinal: Negative.   Genitourinary: Negative.   Bilateral breast pain at scar tissue     Objective:    Physical Exam Constitutional:      Appearance: Normal appearance.  Cardiovascular:     Rate and Rhythm: Normal rate and regular rhythm.  Pulmonary:     Effort: Pulmonary effort is normal.     Breath sounds: Normal breath sounds.  Chest:     Breasts:        Right: Tenderness present. No nipple discharge.        Left: Tenderness present. No nipple discharge.     Comments: Left breast-Hypopigmented skin changes above nipple from radiation treatment, hardened area where lumpectomy was performed Right breast-multiple hardened areas above nipple from lumpectomy       BP (!) 144/82 (BP Location: Right Arm, Patient Position: Sitting, Cuff Size: Normal)  Wt Readings from Last 3 Encounters:  07/01/19 147 lb (66.7 kg)  06/28/19 146 lb 4.8 oz (66.4 kg)  06/26/18 152 lb (68.9 kg)        Assessment & Plan:   Problem List Items Addressed This Visit      Other   Breast cancer of upper-outer quadrant of left female breast ([place])    Other Visit Diagnoses    Breast pain    -   Primary     Plan: Pain most likely from lumpectomies and radiation. NSAIDS/tylenol and warm compresses as needed. Will schedule a diagnostic mammogram for further imaging due to history of breast cancer. Her and her husband are going to [place] Saturday for 2 weeks so she will schedule this for when she returns. Follow up as needed.      Valerie Heath [facility], 3:09 PM 01/24/2020

## 2020-01-25 ENCOUNTER — Ambulatory Visit
Admission: RE | Admit: 2020-01-25 | Discharge: 2020-01-25 | Disposition: A | Discharge status: Home / Self Care | Payer: 59 | Source: Ambulatory Visit | Attending: Obstetrics and Gynecology | Admitting: Obstetrics and Gynecology

## 2020-01-25 ENCOUNTER — Ambulatory Visit
Admission: RE | Admit: 2020-01-25 | Discharge: 2020-01-25 | Disposition: A | Discharge status: Home / Self Care | Payer: 59 | Source: Ambulatory Visit | Attending: Nurse Practitioner | Admitting: Nurse Practitioner

## 2020-01-25 ENCOUNTER — Telehealth: Payer: Self-pay | Admitting: *Deleted

## 2020-01-25 DIAGNOSIS — R234 Changes in skin texture: Secondary | ICD-10-CM

## 2020-01-25 DIAGNOSIS — Z853 Personal history of malignant neoplasm of breast: Secondary | ICD-10-CM

## 2020-01-25 DIAGNOSIS — L905 Scar conditions and fibrosis of skin: Secondary | ICD-10-CM

## 2020-01-25 DIAGNOSIS — N644 Mastodynia: Secondary | ICD-10-CM

## 2020-01-25 NOTE — Telephone Encounter (Signed)
Orders placed, patient scheduled at breast center on 01/25/20 @ 1:50pm

## 2020-01-25 NOTE — Telephone Encounter (Signed)
-----   Message from Tamela Gammon, NP sent at 01/24/2020  3:15 PM EDT ----- Please schedule diagnostic mammogram due to increased scar tissue on both breasts from lumpectomy and radiation. History of bilateral breast cancer. Thank you.

## 2020-06-03 ENCOUNTER — Other Ambulatory Visit: Payer: Self-pay | Admitting: Hematology and Oncology

## 2020-06-03 DIAGNOSIS — C50411 Malignant neoplasm of upper-outer quadrant of right female breast: Secondary | ICD-10-CM

## 2020-06-26 NOTE — Progress Notes (Signed)
Patient Care Team: Marton Redwood, MD as PCP - General (Internal Medicine)  DIAGNOSIS:    ICD-10-CM   1. Malignant neoplasm of upper-outer quadrant of right breast in female, estrogen receptor positive ([place])  C50.411    Z17.0   2. Malignant neoplasm of upper-outer quadrant of left breast in female, estrogen receptor positive ([place])  C50.412    Z17.0     SUMMARY OF ONCOLOGIC HISTORY: Oncology History  Breast cancer of upper-outer quadrant of right female breast ([place])  03/29/2014 Initial Diagnosis   MRI finding of suspicious changes   04/27/2014 Surgery   Right breast lumpectomy: Fibrocystic changes; 5 sentinel lymph nodes negative   Breast cancer of upper-outer quadrant of left female breast ([place])  03/29/2014 Initial Diagnosis   Breast cancer of upper-outer quadrant of left female breast   04/27/2014 Surgery   Left breast lumpectomy: Invasive ductal carcinoma grade 1/3; 1.5 cm with DCIS, ER 100%, PR 53%, Ki-67 8%, HER-2 negative ratio 1.21   06/14/2014 - 08/01/2014 Radiation Therapy   Adjuvant radiation therapy   07/26/2014 Procedure   [place]. Genetic testing was normal, and did not reveal a deleterious mutation in these genes. A complete list of all genes tested is located on the test report scanned into EPIC.     08/02/2014 -  Anti-estrogen oral therapy   Arimidex 1 mg daily X 5 years     CHIEF COMPLIANT: Follow-up on left breast cancer on Arimidex therapy  INTERVAL HISTORY: Valerie Heath is a 69 y.o. with above-mentioned history of left breast cancer underwent lumpectomy, radiation, and who is currently on oral antiestrogen therapy with anastrozole. Mammogram on 01/25/20 showed no evidence of malignancy bilaterally. She presents to the clinic today for annual follow-up.   She had COVID-19 infection earlier in the year and has fully recovered from that.  She has since been vaccinated.  ALLERGIES:  is allergic to erythromycin.  MEDICATIONS:  Current Outpatient  Medications  Medication Sig Dispense Refill  . anastrozole (ARIMIDEX) 1 MG tablet TAKE 1 TABLET BY MOUTH EVERY DAY 90 tablet 3  . Cholecalciferol (VITAMIN D3) 5000 units TABS Take 1 tablet (5,000 Units total) by mouth daily. 30 tablet   . clobetasol cream (TEMOVATE) 0.05 % Apply to affected area as needed 30 g 1  . levothyroxine (SYNTHROID) 112 MCG tablet Take 1 tablet (112 mcg total) by mouth daily before breakfast.    . valACYclovir (VALTREX) 500 MG tablet TAKE 4 TABLETS BY MOUTH ON START OF UCLERATION, THEN 4 TABLETS 12 HOURS LATER  3   No current facility-administered medications for this visit.    PHYSICAL EXAMINATION: ECOG PERFORMANCE STATUS: 1 - Symptomatic but completely ambulatory  Vitals:   06/27/20 1127  BP: (!) 154/82  Pulse: 76  Resp: 17  Temp: 98 F (36.7 C)  SpO2: 96%   Filed Weights   06/27/20 1127  Weight: 149 lb (67.6 kg)    BREAST: No palpable masses or nodules in either right or left breasts. No palpable axillary supraclavicular or infraclavicular adenopathy no breast tenderness or nipple discharge. (exam performed in the presence of a chaperone)  LABORATORY DATA:  I have reviewed the data as listed No flowsheet data found.  Lab Results  Component Value Date   HGB 14.3 04/27/2014    ASSESSMENT & PLAN:  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 Oncotype DX recurrence score was 8 which translates into a 6% risk of distant recurrence Status post radiation therapy completed November 2015, started anastrozole  08/02/2014  Anastrozole toxicities: Patient does not have any side effects from anastrozole. We will continue anastrozole for 1 more year.  Breast Cancer Surveillance: 1. Breast exam10/01/2020: Normal 2. Mammogram5/12/2019: Bilateral mammograms and bilateral ultrasounds: No mammographic evidence of malignancy breast density category C  Return to clinic in 1 year for follow-up  No orders of the defined types were placed in this encounter.  The patient has a good understanding of the overall plan. she agrees with it. she will call with any problems that may develop before the next visit here.  Total time spent: 20 mins including face to face time and time spent for planning, charting and coordination of care  Nicholas Lose, MD 06/27/2020  I, Cloyde Reams Dorshimer, am acting as scribe for Dr. Nicholas Lose.  I have reviewed the above documentation for accuracy and completeness, and I agree with the above.

## 2020-06-27 ENCOUNTER — Inpatient Hospital Stay: Payer: 59 | Attending: Hematology and Oncology | Admitting: Hematology and Oncology

## 2020-06-27 ENCOUNTER — Other Ambulatory Visit: Payer: Self-pay

## 2020-06-27 VITALS — BP 154/82 | HR 76 | Temp 98.0°F | Resp 17 | Ht 65.0 in | Wt 149.0 lb

## 2020-06-27 DIAGNOSIS — Z923 Personal history of irradiation: Secondary | ICD-10-CM | POA: Insufficient documentation

## 2020-06-27 DIAGNOSIS — Z8616 Personal history of COVID-19: Secondary | ICD-10-CM | POA: Insufficient documentation

## 2020-06-27 DIAGNOSIS — C50411 Malignant neoplasm of upper-outer quadrant of right female breast: Secondary | ICD-10-CM | POA: Insufficient documentation

## 2020-06-27 DIAGNOSIS — Z170 Estrogen receptor positive status [ER+]: Secondary | ICD-10-CM

## 2020-06-27 DIAGNOSIS — Z79811 Long term (current) use of aromatase inhibitors: Secondary | ICD-10-CM | POA: Insufficient documentation

## 2020-06-27 DIAGNOSIS — C50412 Malignant neoplasm of upper-outer quadrant of left female breast: Secondary | ICD-10-CM | POA: Insufficient documentation

## 2020-06-27 MED ORDER — VITAMIN D 50 MCG (2000 UT) PO CAPS: 1.0000 | ORAL_CAPSULE | Freq: Every day | ORAL | Status: AC

## 2020-06-27 NOTE — Assessment & Plan Note (Signed)
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 Oncotype DX recurrence score was 8 which translates into a 6% risk of distant recurrence Status post radiation therapy completed November 2015, started anastrozole 08/02/2014  Anastrozole toxicities: Patient does not have any side effects from anastrozole. We will continue anastrozole for 1 more year.  Breast Cancer Surveillance: 1. Breast exam10/01/2020: Normal 2. Mammogram5/12/2019: Bilateral mammograms and bilateral ultrasounds: No mammographic evidence of malignancy breast density category C  Return to clinic in 1 year for follow-up

## 2020-06-27 NOTE — Assessment & Plan Note (Signed)
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 breast exam and mammogram were reviewed and theywere normal

## 2020-07-04 ENCOUNTER — Ambulatory Visit: Payer: 59 | Admitting: Obstetrics and Gynecology

## 2020-07-04 ENCOUNTER — Other Ambulatory Visit: Payer: Self-pay

## 2020-07-04 ENCOUNTER — Encounter: Payer: Self-pay | Admitting: Obstetrics and Gynecology

## 2020-07-04 ENCOUNTER — Encounter: Payer: 59 | Admitting: Gynecology

## 2020-07-04 VITALS — BP 122/80 | Ht 64.0 in | Wt 149.0 lb

## 2020-07-04 DIAGNOSIS — Z01419 Encounter for gynecological examination (general) (routine) without abnormal findings: Secondary | ICD-10-CM | POA: Diagnosis not present

## 2020-07-04 DIAGNOSIS — Z853 Personal history of malignant neoplasm of breast: Secondary | ICD-10-CM | POA: Diagnosis not present

## 2020-07-04 DIAGNOSIS — L900 Lichen sclerosus et atrophicus: Secondary | ICD-10-CM

## 2020-07-04 DIAGNOSIS — Z124 Encounter for screening for malignant neoplasm of cervix: Secondary | ICD-10-CM | POA: Diagnosis not present

## 2020-07-04 MED ORDER — CLOBETASOL PROPIONATE 0.05 % EX CREA: TOPICAL_CREAM | CUTANEOUS | 1 refills | Status: DC

## 2020-07-04 NOTE — Addendum Note (Signed)
Addended by: Nelva Nay on: 07/04/2020 11:44 AM   Modules accepted: Orders

## 2020-07-04 NOTE — Progress Notes (Signed)
Karolyn Messing 01-Jul-1951 [id number]  SUBJECTIVE:  69 y.o. G0P0 female here for a breast and pelvic exam and Pap smear. She has no gynecologic concerns.  Current Outpatient Medications  Medication Sig Dispense Refill  . anastrozole (ARIMIDEX) 1 MG tablet TAKE 1 TABLET BY MOUTH EVERY DAY 90 tablet 3  . Cholecalciferol (VITAMIN D) 50 MCG (2000 UT) CAPS Take 1 capsule (2,000 Units total) by mouth daily. 30 capsule   . clobetasol cream (TEMOVATE) 0.05 % Apply to affected area as needed 30 g 1  . levothyroxine (SYNTHROID) 112 MCG tablet Take 1 tablet (112 mcg total) by mouth daily before breakfast.    . valACYclovir (VALTREX) 500 MG tablet TAKE 4 TABLETS BY MOUTH ON START OF UCLERATION, THEN 4 TABLETS 12 HOURS LATER  3   No current facility-administered medications for this visit.   Allergies: Erythromycin  No LMP recorded. Patient is postmenopausal.  Past medical history,surgical history, problem list, medications, allergies, family history and social history were all reviewed and documented as reviewed in the EPIC chart.  GYN ROS: no abnormal bleeding, pelvic pain or discharge, no breast pain or new or enlarging lumps on self exam.  No dysuria, urinary frequency, pain with urination, cloudy/malodorous urine.   OBJECTIVE:  BP 122/80   Ht 5\' 4"  (1.626 m)   Wt 149 lb (67.6 kg)   BMI 25.58 kg/m  The patient appears well, alert, oriented, in no distress.  BREAST EXAM: breasts appear normal, no suspicious masses, no skin or nipple changes or axillary nodes, well-healed bilateral lumpectomy scars  PELVIC EXAM: VULVA: normal appearing vulva with atrophic change, no masses, tenderness or lesions, VAGINA: normal appearing vagina with atrophic change, normal color and discharge, no lesions, CERVIX: normal appearing atrophic cervix without discharge or lesions, UTERUS: uterus is normal size, shape, consistency and nontender, ADNEXA: normal adnexa in size, nontender and no masses, PAP: Pap smear  done today, thin-prep method  Chaperone: Caryn Bee present during the examination  ASSESSMENT:  69 y.o. G0P0 here for a breast and pelvic exam  PLAN:   1. Postmenopausal.  No significant hot flashes or night sweats.  No vaginal bleeding. 2.  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  Uses clobetasol 0.05% cream as needed.  Refill is provided.  She uses intermittently as needed good control of symptoms. 3. Pap smear/HPV 2019.  No significant history of abnormal Pap smears.  We discussed option to stop screening based on age criteria but she would like to continue.  Pap smear is obtained today. 4.  History of bilateral breast cancer.  Underwent negative screening mammogram.  Her mother had some sort of pelvic cancer, lived to age 64.  Previously discussed options of prophylactic BSO, ultrasound and/or CA-125 screening with Dr. Loetta Rough.  She has decided not to do the screening and accepts the potential for risk of not detecting any disease at the earlier stages the absence of evidence of the effectiveness of routine ultrasound screening.  Normal breast exam today/NED.  Continues on anastrozole at recommendation of her oncology specialist.  She continues to follow with Dr. Lindi Adie. 5. Colonoscopy 2015.  She will follow up at the interval recommended by her GI specialist.   6. DEXA 2017.  She follows with Dr. Brigitte Pulse for bone density.  She says her next DEXA is scheduled in 09/2020.  We will continue to follow with him for bone health management. 7. Health maintenance.  No labs today as she normally has these completed with her primary care doctor.  Return annually or sooner, prn.  Joseph Pierini MD 07/04/20

## 2020-07-05 LAB — PAP IG W/ RFLX HPV ASCU

## 2020-09-26 ENCOUNTER — Encounter (INDEPENDENT_AMBULATORY_CARE_PROVIDER_SITE_OTHER): Payer: Self-pay | Admitting: Ophthalmology

## 2020-09-26 ENCOUNTER — Other Ambulatory Visit: Payer: Self-pay

## 2020-09-26 ENCOUNTER — Ambulatory Visit (INDEPENDENT_AMBULATORY_CARE_PROVIDER_SITE_OTHER): Payer: 59 | Admitting: Ophthalmology

## 2020-09-26 DIAGNOSIS — H43811 Vitreous degeneration, right eye: Secondary | ICD-10-CM

## 2020-09-26 DIAGNOSIS — H35411 Lattice degeneration of retina, right eye: Secondary | ICD-10-CM | POA: Diagnosis not present

## 2020-09-26 DIAGNOSIS — H43812 Vitreous degeneration, left eye: Secondary | ICD-10-CM | POA: Diagnosis not present

## 2020-09-26 HISTORY — DX: Vitreous degeneration, right eye: H43.811

## 2020-09-26 NOTE — Progress Notes (Signed)
09/26/2020     CHIEF COMPLAINT Patient presents for Flashes/floaters (WIP floaters OD//Pt c/o new floaters OD since last night. Pt sts symptoms came on suddenly. No flashes or eye pain OU. No changes OS. Pt sts floaters OD are round. )   HISTORY OF PRESENT ILLNESS: Valerie Heath is a 70 y.o. female who presents to the clinic today for:   HPI    Flashes/floaters    In right eye.  This started 1 day ago.  Duration of 1 day.  Duration Constant.  Characterized as small and spots.  Since onset it is stable.  Associated Symptoms Floaters.  Context:  distance vision, mid-range vision and near vision.  Treatments tried include no treatments.  Response to treatment was no improvement. Additional comments: WIP floaters OD  Pt c/o new floaters OD since last night. Pt sts symptoms came on suddenly. No flashes or eye pain OU. No changes OS. Pt sts floaters OD are round.        Last edited by Ileana Roup, COA on 09/26/2020  2:22 PM. (History)      Referring physician: Martha Clan, MD [address] [place],  [place] [address]  HISTORICAL INFORMATION:   Selected notes from the MEDICAL RECORD NUMBER       CURRENT MEDICATIONS: No current outpatient medications on file. (Ophthalmic Drugs)   No current facility-administered medications for this visit. (Ophthalmic Drugs)   Current Outpatient Medications (Other)  Medication Sig  . anastrozole (ARIMIDEX) 1 MG tablet TAKE 1 TABLET BY MOUTH EVERY DAY  . Cholecalciferol (VITAMIN D) 50 MCG (2000 UT) CAPS Take 1 capsule (2,000 Units total) by mouth daily.  . clobetasol cream (TEMOVATE) 0.05 % Apply to affected area as needed  . levothyroxine (SYNTHROID) 112 MCG tablet Take 1 tablet (112 mcg total) by mouth daily before breakfast.  . valACYclovir (VALTREX) 500 MG tablet TAKE 4 TABLETS BY MOUTH ON START OF UCLERATION, THEN 4 TABLETS 12 HOURS LATER   No current facility-administered medications for this visit. (Other)      REVIEW OF  SYSTEMS:    ALLERGIES Allergies  Allergen Reactions  . Erythromycin Nausea Only    PAST MEDICAL HISTORY Past Medical History:  Diagnosis Date  . Breast cancer (HCC)   . Breast cancer of upper-outer quadrant of left 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)   . Breast cancer of upper-outer quadrant of right 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)   . Cancer (HCC)    breast cancer-lt  . Fibroid   . Hypothyroid   . Personal history of radiation therapy   . Radiation 06/16/14-08/01/14   left and right breast    Past Surgical History:  Procedure Laterality Date  . BREAST BIOPSY    . BREAST LUMPECTOMY Bilateral    2015  . BREAST LUMPECTOMY WITH RADIOACTIVE SEED LOCALIZATION Left 02/21/2014   Procedure: BREAST LUMPECTOMY WITH RADIOACTIVE SEED LOCALIZATION;  Surgeon: Ernestene Mention, MD;  Location: [facility];  Service: General;  Laterality: Left;  . BREAST SURGERY Right 04/2014   Lumpectomy   . BREAST SURGERY Left   . COLONOSCOPY    . LYMPH NODE DISSECTION  04/27/14   right and left  . MYOMECTOMY    . REFRACTIVE SURGERY     X 2  . SHOULDER SURGERY  2005   left  . TONSILLECTOMY AND ADENOIDECTOMY      FAMILY HISTORY Family History  Problem Relation Age of Onset  . Hypertension Mother   . Breast cancer Mother 31  . Cancer  Mother 55       breast cancer at 43 ; Fallopian tube tumor in her 60s  . Diabetes Father   . Hypertension Father   . Heart disease Father   . Prostate cancer Father   . Cancer Father 23       prostate cancer  . Colon cancer Paternal Uncle   . Cancer Paternal Uncle 70       colon cancer  . Throat cancer Maternal Uncle   . Cancer Maternal Uncle 45       prostate cancer  . Prostate cancer Maternal Uncle   . Breast cancer Maternal Aunt 45  . Leukemia Paternal Grandfather   . Leukemia Maternal Aunt   . Cancer Paternal Uncle 50       brain cancer  . Brain cancer Paternal Uncle     SOCIAL HISTORY Social History   Tobacco Use  . Smoking status: Never Smoker  . Smokeless  tobacco: Never Used  Vaping Use  . Vaping Use: Never used  Substance Use Topics  . Alcohol use: Yes    Alcohol/week: 2.0 standard drinks    Types: 2 Standard drinks or equivalent per week    Comment: Socially  . Drug use: No         OPHTHALMIC EXAM: Base Eye Exam    Visual Acuity (ETDRS)      Right Left   Dist [place] 20/40 +1 20/30 -1   Dist ph [place] 20/20 -2 20/20 -2       Tonometry (Tonopen, 2:18 PM)      Right Left   Pressure 12 10       Pupils      Dark Light Shape React APD   Right 4 3 Round Brisk None   Left 5 4 Round Brisk None       Visual Fields (Counting fingers)      Left Right    Full Full       Extraocular Movement      Right Left    Full Full       Neuro/Psych    Oriented x3: Yes   Mood/Affect: Normal       Dilation    Right eye: 1.0% Mydriacyl, 2.5% Phenylephrine @ 2:26 PM        Slit Lamp and Fundus Exam    External Exam      Right Left   External Normal Normal       Slit Lamp Exam      Right Left   Lids/Lashes Normal Normal   Conjunctiva/Sclera White and quiet White and quiet   Cornea Clear Clear   Anterior Chamber Deep and quiet Deep and quiet   Iris Round and reactive Round and reactive   Lens 2+ Nuclear sclerosis 2+ Nuclear sclerosis   Anterior Vitreous Normal Normal       Fundus Exam      Right Left   Posterior Vitreous Posterior vitreous detachment Not dilated   Disc Normal Normal   C/D Ratio 0.2    Macula Normal    Vessels Normal    Periphery Lattice degeneration, SN atrophic hole in the bed of lattice, ST lattice with no holes           IMAGING AND PROCEDURES  Imaging and Procedures for 09/26/20  Color Fundus Photography Optos - OU - Both Eyes       Right Eye Progression has improved. Disc findings include normal observations. Macula : normal observations. Vessels :  normal observations.   Left Eye Progression has improved. Disc findings include normal observations. Vessels : normal observations.    Notes Incomplete view the periphery due to lashes and lid, lattice degeneration superotemporal well seen.  OD.  Poor peripheral viewing OS.,  Weiss ring likely present                ASSESSMENT/PLAN:  Lattice degeneration of retina, right eye The nature of lattice degeneration was discussed with the patient, including the increased risk of retinal breaks and retinal detachment. A discussion of the evidence regarding prophylactic laser photocoagulation for lattice degeneration to prevent retinal breaks and detachment was given. The patient was advised to return immediately for new flashes or floaters.      ICD-10-CM   1. Lattice degeneration of retina, right eye  H35.411 Color Fundus Photography Optos - OU - Both Eyes  2. Posterior vitreous detachment of right eye  H43.811 Color Fundus Photography Optos - OU - Both Eyes  3. Posterior vitreous detachment of left eye  H43.812     1.  History of retinal holes OU, treated with laser retinopexy 2017, no high risk features seen today right eye with new onset vitreous floaters.  Lattice degeneration is seen in 2 quadrants superonasal and superotemporal.  No new retinal breaks although atrophic hole is noted superonasally.  We will monitor this area closely  2.  OU next dilate  3.  Next visit particular attention OD to superonasal quadrant  Ophthalmic Meds Ordered this visit:  No orders of the defined types were placed in this encounter.      Return in about 6 weeks (around 11/07/2020) for DILATE OU, COLOR FP, OCT.  There are no Patient Instructions on file for this visit.   Explained the diagnoses, plan, and follow up with the patient and they expressed understanding.  Patient expressed understanding of the importance of proper follow up care.   Alford Highland Fernado Brigante M.D. Diseases & Surgery of the Retina and Vitreous [facility] 09/26/20     Abbreviations: M myopia (nearsighted); A astigmatism; H hyperopia  (farsighted); P presbyopia; Mrx spectacle prescription;  CTL contact lenses; OD right eye; OS left eye; OU both eyes  XT exotropia; ET esotropia; PEK punctate epithelial keratitis; PEE punctate epithelial erosions; DES dry eye syndrome; MGD meibomian gland dysfunction; ATs artificial tears; PFAT's preservative free artificial tears; NSC nuclear sclerotic cataract; PSC posterior subcapsular cataract; ERM epi-retinal membrane; PVD posterior vitreous detachment; RD retinal detachment; DM diabetes mellitus; DR diabetic retinopathy; NPDR non-proliferative diabetic retinopathy; PDR proliferative diabetic retinopathy; CSME clinically significant macular edema; DME diabetic macular edema; dbh dot blot hemorrhages; CWS cotton wool spot; POAG primary open angle glaucoma; C/D cup-to-disc ratio; HVF humphrey visual field; GVF goldmann visual field; OCT optical coherence tomography; IOP intraocular pressure; BRVO Branch retinal vein occlusion; CRVO central retinal vein occlusion; CRAO central retinal artery occlusion; BRAO branch retinal artery occlusion; RT retinal tear; SB scleral buckle; PPV pars plana vitrectomy; VH Vitreous hemorrhage; PRP panretinal laser photocoagulation; IVK intravitreal kenalog; VMT vitreomacular traction; MH Macular hole;  NVD neovascularization of the disc; NVE neovascularization elsewhere; AREDS age related eye disease study; ARMD age related macular degeneration; POAG primary open angle glaucoma; EBMD epithelial/anterior basement membrane dystrophy; ACIOL anterior chamber intraocular lens; IOL intraocular lens; PCIOL posterior chamber intraocular lens; Phaco/IOL phacoemulsification with intraocular lens placement; PRK photorefractive keratectomy; LASIK laser assisted in situ keratomileusis; HTN hypertension; DM diabetes mellitus; COPD chronic obstructive pulmonary disease

## 2020-09-26 NOTE — Assessment & Plan Note (Signed)
The nature of lattice degeneration was discussed with the patient, including the increased risk of retinal breaks and retinal detachment. A discussion of the evidence regarding prophylactic laser photocoagulation for lattice degeneration to prevent retinal breaks and detachment was given. The patient was advised to return immediately for new flashes or floaters.

## 2020-11-07 ENCOUNTER — Ambulatory Visit (INDEPENDENT_AMBULATORY_CARE_PROVIDER_SITE_OTHER): Payer: 59 | Admitting: Ophthalmology

## 2020-11-07 ENCOUNTER — Other Ambulatory Visit: Payer: Self-pay

## 2020-11-07 ENCOUNTER — Encounter (INDEPENDENT_AMBULATORY_CARE_PROVIDER_SITE_OTHER): Payer: Self-pay | Admitting: Ophthalmology

## 2020-11-07 DIAGNOSIS — H43812 Vitreous degeneration, left eye: Secondary | ICD-10-CM | POA: Diagnosis not present

## 2020-11-07 DIAGNOSIS — H35411 Lattice degeneration of retina, right eye: Secondary | ICD-10-CM | POA: Diagnosis not present

## 2020-11-07 DIAGNOSIS — H35412 Lattice degeneration of retina, left eye: Secondary | ICD-10-CM | POA: Diagnosis not present

## 2020-11-07 DIAGNOSIS — H43811 Vitreous degeneration, right eye: Secondary | ICD-10-CM | POA: Diagnosis not present

## 2020-11-07 DIAGNOSIS — H2513 Age-related nuclear cataract, bilateral: Secondary | ICD-10-CM

## 2020-11-07 DIAGNOSIS — H2512 Age-related nuclear cataract, left eye: Secondary | ICD-10-CM | POA: Insufficient documentation

## 2020-11-07 NOTE — Progress Notes (Signed)
11/07/2020     CHIEF COMPLAINT Patient presents for Retina Follow Up (6 Week F/U OU//VA stable OU per pt. No new symptoms OU.)   HISTORY OF PRESENT ILLNESS: Valerie Heath is a 70 y.o. female who presents to the clinic today for:   HPI    Retina Follow Up    Patient presents with  Other.  In both eyes.  This started 6 weeks ago.  Severity is mild.  Duration of 6 weeks.  Since onset it is stable. Additional comments: 6 Week F/U OU  VA stable OU per pt. No new symptoms OU.       Last edited by Rockie Neighbours, [place] on 11/07/2020  1:15 PM. (History)      Referring physician: Marton Redwood, MD [place],  [place] [address]  HISTORICAL INFORMATION:   Selected notes from the [place]: No current outpatient medications on file. (Ophthalmic Drugs)   No current facility-administered medications for this visit. (Ophthalmic Drugs)   Current Outpatient Medications (Other)  Medication Sig  . anastrozole (ARIMIDEX) 1 MG tablet TAKE 1 TABLET BY MOUTH EVERY DAY  . Cholecalciferol (VITAMIN D) 50 MCG (2000 UT) CAPS Take 1 capsule (2,000 Units total) by mouth daily.  . clobetasol cream (TEMOVATE) 0.05 % Apply to affected area as needed  . levothyroxine (SYNTHROID) 112 MCG tablet Take 1 tablet (112 mcg total) by mouth daily before breakfast.  . valACYclovir (VALTREX) 500 MG tablet TAKE 4 TABLETS BY MOUTH ON START OF UCLERATION, THEN 4 TABLETS 12 HOURS LATER   No current facility-administered medications for this visit. (Other)      REVIEW OF SYSTEMS:    ALLERGIES Allergies  Allergen Reactions  . Erythromycin Nausea Only    PAST MEDICAL HISTORY Past Medical History:  Diagnosis Date  . Breast cancer ([place])   . Breast cancer of upper-outer quadrant of left female breast ([facility])   . Breast cancer of upper-outer quadrant of right female breast ([place])   . Cancer ([facility])    breast cancer-lt  . Fibroid   . Hypothyroid   . Personal  history of radiation therapy   . Radiation 06/16/14-08/01/14   left and right breast    Past Surgical History:  Procedure Laterality Date  . BREAST BIOPSY    . BREAST LUMPECTOMY Bilateral    2015  . BREAST LUMPECTOMY WITH RADIOACTIVE SEED LOCALIZATION Left 02/21/2014   Procedure: BREAST LUMPECTOMY WITH RADIOACTIVE SEED LOCALIZATION;  Surgeon: Adin Hector, MD;  Location: [facility];  Service: General;  Laterality: Left;  . BREAST SURGERY Right 04/2014   Lumpectomy   . BREAST SURGERY Left   . COLONOSCOPY    . LYMPH NODE DISSECTION  04/27/14   right and left  . MYOMECTOMY    . REFRACTIVE SURGERY     X 2  . SHOULDER SURGERY  2005   left  . TONSILLECTOMY AND ADENOIDECTOMY      FAMILY HISTORY Family History  Problem Relation Age of Onset  . Hypertension Mother   . Breast cancer Mother 69  . Cancer Mother 49       breast cancer at 109 ; Fallopian tube tumor in her 95s  . Diabetes Father   . Hypertension Father   . Heart disease Father   . Prostate cancer Father   . Cancer Father 64       prostate cancer  . Colon cancer Paternal Uncle   .  Cancer Paternal Uncle 2       colon cancer  . Throat cancer Maternal Uncle   . Cancer Maternal Uncle 45       prostate cancer  . Prostate cancer Maternal Uncle   . Breast cancer Maternal Aunt 36  . Leukemia Paternal Grandfather   . Leukemia Maternal Aunt   . Cancer Paternal Uncle 2       brain cancer  . Brain cancer Paternal Uncle     SOCIAL HISTORY Social History   Tobacco Use  . Smoking status: Never Smoker  . Smokeless tobacco: Never Used  Vaping Use  . Vaping Use: Never used  Substance Use Topics  . Alcohol use: Yes    Alcohol/week: 2.0 standard drinks    Types: 2 Standard drinks or equivalent per week    Comment: Socially  . Drug use: No         OPHTHALMIC EXAM:  Base Eye Exam    Visual Acuity (ETDRS)      Right Left   Dist [place] 20/50 +2 20/30 -2   Dist ph [place] 20/25 +2 20/25 -2       Tonometry  (Tonopen, 1:15 PM)      Right Left   Pressure 12 09       Pupils      Dark Light Shape React APD   Right 4 3 Round Brisk None   Left 5 4 Round Brisk None       Visual Fields (Counting fingers)      Left Right    Full Full       Extraocular Movement      Right Left    Full Full       Neuro/Psych    Oriented x3: Yes   Mood/Affect: Normal       Dilation    Both eyes: 1.0% Mydriacyl, 2.5% Phenylephrine @ 1:20 PM        Slit Lamp and Fundus Exam    External Exam      Right Left   External Normal Normal       Slit Lamp Exam      Right Left   Lids/Lashes Normal Normal   Conjunctiva/Sclera White and quiet White and quiet   Cornea Clear Clear   Anterior Chamber Deep and quiet Deep and quiet   Iris Round and reactive Round and reactive   Lens 2+ Nuclear sclerosis 2+ Nuclear sclerosis   Anterior Vitreous Normal, no pigment Normal, no pigment       Fundus Exam      Right Left   Posterior Vitreous Posterior vitreous detachment Posterior vitreous detachment   Disc Normal Normal   C/D Ratio 0.2 0.2   Macula Normal Normal   Vessels Normal Normal   Periphery Lattice degeneration, SN atrophic hole in the bed of lattice, ST lattice with no holes,  Good retinopexy superonasal, old break, no new tears          IMAGING AND PROCEDURES  Imaging and Procedures for 11/07/20  Color Fundus Photography Optos - OU - Both Eyes       Right Eye Progression has no prior data. Disc findings include normal observations. Macula : normal observations.   Left Eye Progression has no prior data. Disc findings include normal observations. Macula : normal observations.   Notes Incidental note of posterior vitreous detachment, Weiss ring OU                ASSESSMENT/PLAN:  [employer]  degeneration, left eye No new findings no new breaks  Lattice degeneration of retina, right eye No retinal holes or tears  Age-related nuclear cataract of both eyes The nature of cataract  was discussed with the patient as well as the elective nature of surgery. The patient was reassured that surgery at a later date does not put the patient at risk for a worse outcome. It was emphasized that the need for surgery is dictated by the patient's quality of life as influenced by the cataract. Patient was instructed to maintain close follow up with their general eye care doctor.  OU, patient understands that she has equivalent of clear dark yellow-green gray band sunglasses darkening her vision so that she might notice troubles with nighttime driving and/or dark rainy days seeing clearly or having to use brighter lights.      ICD-10-CM   1. Lattice degeneration of retina, right eye  H35.411 Color Fundus Photography Optos - OU - Both Eyes  2. Posterior vitreous detachment of right eye  H43.811 Color Fundus Photography Optos - OU - Both Eyes    CANCELED: OCT, Retina - OU - Both Eyes  3. Posterior vitreous detachment of left eye  H43.812 Color Fundus Photography Optos - OU - Both Eyes    CANCELED: OCT, Retina - OU - Both Eyes  4. Lattice degeneration, left eye  H35.412   5. Age-related nuclear cataract of both eyes  H25.13     1.  No new retinal holes or tears OU  2.  Patient understands critical importance of reporting promptly of new onset sparkles of light flashes of light darkness curtain of darkness or new onset of floaters above and beyond this new baseline of findings she currently experiences  3.  Ophthalmic Meds Ordered this visit:  No orders of the defined types were placed in this encounter.      Return in about 1 year (around 11/07/2021) for dilate, COLOR FP.  There are no Patient Instructions on file for this visit.   Explained the diagnoses, plan, and follow up with the patient and they expressed understanding.  Patient expressed understanding of the importance of proper follow up care.   Clent Demark Rankin M.D. Diseases & Surgery of the Retina and Vitreous [facility] 11/07/20     Abbreviations: M myopia (nearsighted); A astigmatism; H hyperopia (farsighted); P presbyopia; Mrx spectacle prescription;  CTL contact lenses; OD right eye; OS left eye; OU both eyes  XT exotropia; ET esotropia; PEK punctate epithelial keratitis; PEE punctate epithelial erosions; DES dry eye syndrome; MGD meibomian gland dysfunction; ATs artificial tears; PFAT's preservative free artificial tears; [place] nuclear sclerotic cataract; PSC posterior subcapsular cataract; ERM epi-retinal membrane; PVD posterior vitreous detachment; RD retinal detachment; DM diabetes mellitus; DR diabetic retinopathy; NPDR non-proliferative diabetic retinopathy; PDR proliferative diabetic retinopathy; CSME clinically significant macular edema; DME diabetic macular edema; dbh dot blot hemorrhages; CWS cotton wool spot; POAG primary open angle glaucoma; C/D cup-to-disc ratio; HVF humphrey visual field; GVF goldmann visual field; OCT optical coherence tomography; IOP intraocular pressure; BRVO Branch retinal vein occlusion; CRVO central retinal vein occlusion; CRAO central retinal artery occlusion; BRAO branch retinal artery occlusion; RT retinal tear; SB scleral buckle; PPV pars plana vitrectomy; VH Vitreous hemorrhage; PRP panretinal laser photocoagulation; IVK intravitreal kenalog; VMT vitreomacular traction; MH Macular hole;  NVD neovascularization of the disc; NVE neovascularization elsewhere; AREDS age related eye disease study; ARMD age related macular degeneration; POAG primary open angle glaucoma; EBMD epithelial/anterior basement  membrane dystrophy; ACIOL anterior chamber intraocular lens; IOL intraocular lens; PCIOL posterior chamber intraocular lens; Phaco/IOL phacoemulsification with intraocular lens placement; [place] photorefractive keratectomy; LASIK laser assisted in situ keratomileusis; HTN hypertension; DM diabetes mellitus; COPD chronic obstructive pulmonary disease

## 2020-11-07 NOTE — Assessment & Plan Note (Signed)
No retinal holes or tears ?

## 2020-11-07 NOTE — Assessment & Plan Note (Signed)
The nature of cataract was discussed with the patient as well as the elective nature of surgery. The patient was reassured that surgery at a later date does not put the patient at risk for a worse outcome. It was emphasized that the need for surgery is dictated by the patient's quality of life as influenced by the cataract. Patient was instructed to maintain close follow up with their general eye care doctor.  OU, patient understands that she has equivalent of clear dark yellow-green gray band sunglasses darkening her vision so that she might notice troubles with nighttime driving and/or dark rainy days seeing clearly or having to use brighter lights.

## 2020-11-07 NOTE — Assessment & Plan Note (Signed)
No new findings no new breaks

## 2020-11-17 ENCOUNTER — Encounter (INDEPENDENT_AMBULATORY_CARE_PROVIDER_SITE_OTHER): Payer: Self-pay

## 2020-12-18 ENCOUNTER — Other Ambulatory Visit: Payer: Self-pay | Admitting: Obstetrics and Gynecology

## 2020-12-18 DIAGNOSIS — Z1231 Encounter for screening mammogram for malignant neoplasm of breast: Secondary | ICD-10-CM

## 2021-01-10 ENCOUNTER — Encounter: Payer: Self-pay | Admitting: Obstetrics and Gynecology

## 2021-01-10 ENCOUNTER — Ambulatory Visit: Payer: 59 | Admitting: Obstetrics and Gynecology

## 2021-01-10 ENCOUNTER — Other Ambulatory Visit: Payer: Self-pay

## 2021-01-10 VITALS — BP 122/74 | HR 86 | Ht 65.0 in | Wt 156.0 lb

## 2021-01-10 DIAGNOSIS — Z0000 Encounter for general adult medical examination without abnormal findings: Secondary | ICD-10-CM

## 2021-01-10 DIAGNOSIS — Z853 Personal history of malignant neoplasm of breast: Secondary | ICD-10-CM | POA: Diagnosis not present

## 2021-01-10 NOTE — Progress Notes (Signed)
GYNECOLOGY  VISIT   HPI: 70 y.o.   Married White or Caucasian Not Hispanic or Latino  female   G0P0 with No LMP recorded. Patient is postmenopausal.   here for left breast 12'oclock area she says that is red. She states that there is also one at the 1 o'clock area.     She has a h/o bilateral breast cancer, diagnosed in 2015. She had bilateral lumpectomies and radiation. Then treated with Arimidex (still on it) years. Last mammogram was in 5/21 and was normal.   She has a 1 day h/o erythema on her left breast. No pain, not tender. She doesn't feel any lumps.   GYNECOLOGIC HISTORY: No LMP recorded. Patient is postmenopausal. Contraception: none  Menopausal hormone therapy: none         OB History    Gravida  0   Para      Term      Preterm      AB      Living        SAB      IAB      Ectopic      Multiple      Live Births                 Patient Active Problem List   Diagnosis Date Noted  . Lattice degeneration, 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  . Age-related nuclear cataract of both eyes 11/07/2020  . Posterior vitreous detachment of 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  . Posterior vitreous detachment of left eye 09/26/2020  . Lattice degeneration of retina, 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  . Breast cancer of upper-outer quadrant of right female breast ([place]) 03/29/2014  . Breast cancer of upper-outer quadrant of left female breast ([place]) 03/29/2014  . Atypical lobular hyperplasia of left breast 01/25/2014    Past Medical History:  Diagnosis Date  . Breast cancer ([place])   . Breast cancer of upper-outer quadrant of left female breast ([place])   . Breast cancer of upper-outer quadrant of right female breast ([place])   . Cancer ([place])    breast cancer-lt  . Fibroid   . Hypothyroid   . Personal history of radiation therapy   . Radiation 06/16/14-08/01/14   left and right breast     Past Surgical History:  Procedure Laterality Date  . BREAST BIOPSY    . BREAST LUMPECTOMY Bilateral    2015  .  BREAST LUMPECTOMY WITH RADIOACTIVE SEED LOCALIZATION Left 02/21/2014   Procedure: BREAST LUMPECTOMY WITH RADIOACTIVE SEED LOCALIZATION;  Surgeon: Adin Hector, MD;  Location: [facility];  Service: General;  Laterality: Left;  . BREAST SURGERY Right 04/2014   Lumpectomy   . BREAST SURGERY Left   . COLONOSCOPY    . LYMPH NODE DISSECTION  04/27/14   right and left  . MYOMECTOMY    . REFRACTIVE SURGERY     X 2  . SHOULDER SURGERY  2005   left  . TONSILLECTOMY AND ADENOIDECTOMY      Current Outpatient Medications  Medication Sig Dispense Refill  . anastrozole (ARIMIDEX) 1 MG tablet TAKE 1 TABLET BY MOUTH EVERY DAY 90 tablet 3  . Cholecalciferol (VITAMIN D) 50 MCG (2000 UT) CAPS Take 1 capsule (2,000 Units total) by mouth daily. 30 capsule   . clobetasol cream (TEMOVATE) 0.05 % Apply to affected area as needed 60 g 1  . levothyroxine (SYNTHROID) 112 MCG tablet Take 1 tablet (112 mcg total) by mouth daily before breakfast.    .  valACYclovir (VALTREX) 500 MG tablet TAKE 4 TABLETS BY MOUTH ON START OF UCLERATION, THEN 4 TABLETS 12 HOURS LATER  3   No current facility-administered medications for this visit.     ALLERGIES: Erythromycin  Family History  Problem Relation Age of Onset  . Hypertension Mother   . Breast cancer Mother 35  . Cancer Mother 60       breast cancer at 6 ; Fallopian tube tumor in her 57s  . Diabetes Father   . Hypertension Father   . Heart disease Father   . Prostate cancer Father   . Cancer Father 28       prostate cancer  . Colon cancer Paternal Uncle   . Cancer Paternal Uncle 24       colon cancer  . Throat cancer Maternal Uncle   . Cancer Maternal Uncle 45       prostate cancer  . Prostate cancer Maternal Uncle   . Breast cancer Maternal Aunt 18  . Leukemia Paternal Grandfather   . Leukemia Maternal Aunt   . Cancer Paternal Uncle 36       brain cancer  . Brain cancer Paternal Uncle     Social History   Socioeconomic History  .  Marital status: Married    Spouse name: Not on file  . Number of children: 0  . Years of education: Not on file  . Highest education level: Not on file  Occupational History  . Occupation: retired - Sales executive for [employer]  . Smoking status: Never Smoker  . Smokeless tobacco: Never Used  Vaping Use  . Vaping Use: Never used  Substance and Sexual Activity  . Alcohol use: Yes    Alcohol/week: 2.0 standard drinks    Types: 2 Standard drinks or equivalent per week    Comment: Socially  . Drug use: No  . Sexual activity: Yes    Birth control/protection: Post-menopausal    Comment: 1st intercourse 70 yo-Fewer than 5 partners  Other Topics Concern  . Not on file  Social History Narrative  . Not on file   Social Determinants of Health   Financial Resource Strain: Not on file  Food Insecurity: Not on file  Transportation Needs: Not on file  Physical Activity: Not on file  Stress: Not on file  Social Connections: Not on file  Intimate Partner Violence: Not on file    Review of Systems  All other systems reviewed and are negative.   PHYSICAL EXAMINATION:    BP 122/74   Pulse 86   Ht 5\' 5"  (1.651 m)   Wt 156 lb (70.8 kg)   SpO2 99%   BMI 25.96 kg/m     General appearance: alert, cooperative and appears stated age Breasts: evidence of bilateral lumpectomy and radiation. In the left breast within radiation induced whitening/thinning of the skin at 12-1 o'clock is a 4 mm erythematout patch that appears to be a superficial skin trauma (ie ruptured vessel). At 2 o'clock there is a 1-2 mm dot of erthema. No concerning skin changes. No lumps.  No supraclavicular or axillary adenopathy.    1. History of breast cancer   2. Normal breast exam She has 2 superficial areas of erythema in her left breast within prior radiation damaged skin. They appear to be superficial, benign changes (suspect she bumped her breast). No concerning findings. If she notices any  continued changes she will return She has a mammogram scheduled for 5/22.

## 2021-02-14 ENCOUNTER — Ambulatory Visit
Admission: RE | Admit: 2021-02-14 | Discharge: 2021-02-14 | Disposition: A | Discharge status: Home / Self Care | Payer: 59 | Source: Ambulatory Visit | Attending: Obstetrics and Gynecology | Admitting: Obstetrics and Gynecology

## 2021-02-14 ENCOUNTER — Other Ambulatory Visit: Payer: Self-pay

## 2021-02-14 DIAGNOSIS — Z1231 Encounter for screening mammogram for malignant neoplasm of breast: Secondary | ICD-10-CM

## 2021-02-22 ENCOUNTER — Other Ambulatory Visit: Payer: Self-pay

## 2021-02-22 ENCOUNTER — Ambulatory Visit (INDEPENDENT_AMBULATORY_CARE_PROVIDER_SITE_OTHER): Payer: 59 | Admitting: Ophthalmology

## 2021-02-22 ENCOUNTER — Encounter (INDEPENDENT_AMBULATORY_CARE_PROVIDER_SITE_OTHER): Payer: Self-pay | Admitting: Ophthalmology

## 2021-02-22 DIAGNOSIS — H35411 Lattice degeneration of retina, right eye: Secondary | ICD-10-CM | POA: Diagnosis not present

## 2021-02-22 DIAGNOSIS — H43811 Vitreous degeneration, right eye: Secondary | ICD-10-CM

## 2021-02-22 DIAGNOSIS — H33021 Retinal detachment with multiple breaks, right eye: Secondary | ICD-10-CM | POA: Diagnosis not present

## 2021-02-22 HISTORY — DX: Retinal detachment with multiple breaks, right eye: H33.021

## 2021-02-22 MED ORDER — PREDNISOLONE ACETATE 1 % OP SUSP: 1.0000 [drp] | Freq: Four times a day (QID) | OPHTHALMIC | 0 refills | Status: AC

## 2021-02-22 MED ORDER — OFLOXACIN 0.3 % OP SOLN: 1.0000 [drp] | Freq: Four times a day (QID) | OPHTHALMIC | 0 refills | Status: AC

## 2021-02-22 MED ORDER — CYCLOPENTOLATE HCL 1 % OP SOLN: 1.0000 [drp] | Freq: Two times a day (BID) | OPHTHALMIC | 0 refills | Status: AC

## 2021-02-22 NOTE — Assessment & Plan Note (Addendum)
The nature of retinal detachment was discussed with the patient as well, as the treatment options which include pneumatic retinopexy, scleral buckling, and vitrectomy scleral buckling.  Possible side effects of the various surgical procedures were discussed with the patient.  Possible complications of surgery were discussed with the patient.  Explained the following success rates are involved with retinal detachment surgeries: 90% rate of reattachment success with primary operation, 85% chance of 2nd operation needed (if Pneumatic isn't done first), 70-75% chance of 3rd operation, & 50% chance of 4th & subsequent surgeries.   Patient understands that ongoing scarring of retina may lead to reoccurrence of retinal tears or detachments.  The patient's questions were answered. An informational brochure was given to the patient.  All the patient's questions were answered.  OD, phakic macula off retinal detachment.  Sudden onset.  Will need scleral buckle retinal cryopexy right eye.  We will plan on a general anesthesia

## 2021-02-22 NOTE — Progress Notes (Signed)
02/22/2021     CHIEF COMPLAINT Patient presents for Blurred Vision (WIP- va change in OD since Tuesday/Pt states, "I woke up Tuesday morning and my va OD was really blurry. It looks like a spill and I am seeing green on the outer edges of my vision. There is also like a flickering in my OD.")   HISTORY OF PRESENT ILLNESS: Valerie Heath is a 70 y.o. female who presents to the clinic today for:   HPI    Blurred Vision    Laterality: right eye   Onset: sudden   Quality: blurred, difficult to focus and distorted   Severity: moderate   Onset: 2 days ago   Frequency: constantly   Associated symptoms: headache (Only slight yesterday).  Negative for glare, haloes, double vision and eye pain   Treatments tried: no treatments   Comments: WIP- va change in OD since Tuesday Pt states, "I woke up Tuesday morning and my va OD was really blurry. It looks like a spill and I am seeing green on the outer edges of my vision. There is also like a flickering in my OD."       Last edited by Kendra Opitz, COA on 02/22/2021 10:22 AM. (History)      Referring physician: Ginger Organ., MD [place],  [place] [id number]  HISTORICAL INFORMATION:   Selected notes from the MEDICAL RECORD NUMBER       CURRENT MEDICATIONS: No current outpatient medications on file. (Ophthalmic Drugs)   No current facility-administered medications for this visit. (Ophthalmic Drugs)   Current Outpatient Medications (Other)  Medication Sig  . anastrozole (ARIMIDEX) 1 MG tablet TAKE 1 TABLET BY MOUTH EVERY DAY  . Cholecalciferol (VITAMIN D) 50 MCG (2000 UT) CAPS Take 1 capsule (2,000 Units total) by mouth daily.  . clobetasol cream (TEMOVATE) 0.05 % Apply to affected area as needed  . levothyroxine (SYNTHROID) 112 MCG tablet Take 1 tablet (112 mcg total) by mouth daily before breakfast.  . valACYclovir (VALTREX) 500 MG tablet TAKE 4 TABLETS BY MOUTH ON START OF UCLERATION, THEN 4 TABLETS 12 HOURS  LATER   No current facility-administered medications for this visit. (Other)      REVIEW OF SYSTEMS:    ALLERGIES Allergies  Allergen Reactions  . Erythromycin Nausea Only    PAST MEDICAL HISTORY Past Medical History:  Diagnosis Date  . Breast cancer ([place])   . Breast cancer of upper-outer quadrant of left female breast ([place])   . Breast cancer of upper-outer quadrant of right female breast ([place])   . Cancer ([place])    breast cancer-lt  . Fibroid   . Hypothyroid   . Personal history of radiation therapy   . Radiation 06/16/14-08/01/14   left and right breast    Past Surgical History:  Procedure Laterality Date  . BREAST BIOPSY    . BREAST LUMPECTOMY Bilateral    2015  . BREAST LUMPECTOMY WITH RADIOACTIVE SEED LOCALIZATION Left 02/21/2014   Procedure: BREAST LUMPECTOMY WITH RADIOACTIVE SEED LOCALIZATION;  Surgeon: Adin Hector, MD;  Location: [facility];  Service: General;  Laterality: Left;  . BREAST SURGERY Right 04/2014   Lumpectomy   . BREAST SURGERY Left   . COLONOSCOPY    . LYMPH NODE DISSECTION  04/27/14   right and left  . MYOMECTOMY    . REFRACTIVE SURGERY     X 2  . SHOULDER SURGERY  2005   left  . TONSILLECTOMY  AND ADENOIDECTOMY      FAMILY HISTORY Family History  Problem Relation Age of Onset  . Hypertension Mother   . Breast cancer Mother 72  . Cancer Mother 34       breast cancer at 39 ; Fallopian tube tumor in her 78s  . Diabetes Father   . Hypertension Father   . Heart disease Father   . Prostate cancer Father   . Cancer Father 15       prostate cancer  . Colon cancer Paternal Uncle   . Cancer Paternal Uncle 47       colon cancer  . Throat cancer Maternal Uncle   . Cancer Maternal Uncle 45       prostate cancer  . Prostate cancer Maternal Uncle   . Breast cancer Maternal Aunt 25  . Leukemia Paternal Grandfather   . Leukemia Maternal Aunt   . Cancer Paternal Uncle 20       brain cancer  . Brain cancer Paternal Uncle      SOCIAL HISTORY Social History   Tobacco Use  . Smoking status: Never Smoker  . Smokeless tobacco: Never Used  Vaping Use  . Vaping Use: Never used  Substance Use Topics  . Alcohol use: Yes    Alcohol/week: 2.0 standard drinks    Types: 2 Standard drinks or equivalent per week    Comment: Socially  . Drug use: No         OPHTHALMIC EXAM: Base Eye Exam    Visual Acuity (ETDRS)      Right Left   Dist [place] CF at face 20/25       Tonometry (Tonopen, 10:26 AM)      Right Left   Pressure 12 12       Pupils      Pupils Dark Light Shape React APD   Right PERRL 4 3 Round Brisk None   Left PERRL 4 3 Round Brisk None       Visual Fields      Left Right    Full    Restrictions  Partial outer superior temporal, inferior temporal deficiencies       Extraocular Movement      Right Left    Full Full       Neuro/Psych    Oriented x3: Yes   Mood/Affect: Normal       Dilation    Right eye: 1.0% Mydriacyl, 2.5% Phenylephrine @ 10:27 AM        Slit Lamp and Fundus Exam    External Exam      Right Left   External Normal Normal       Slit Lamp Exam      Right Left   Lids/Lashes Normal Normal   Conjunctiva/Sclera White and quiet White and quiet   Cornea Clear Clear   Anterior Chamber Deep and quiet Deep and quiet   Iris Round and reactive Round and reactive   Lens 2+ Nuclear sclerosis, 1+ Cortical cataract 2+ Nuclear sclerosis   Anterior Vitreous cell 1+, RBC Normal, no pigment       Fundus Exam      Right Left   Posterior Vitreous Posterior vitreous detachment Posterior vitreous detachment   Disc Normal Normal   C/D Ratio 0.2 0.2   Macula Detached Normal   Vessels Normal Normal   Periphery RD macula off, large retinal break at 11, Horseshoe tear Good retinopexy superonasal, old break, no new tears  IMAGING AND PROCEDURES  Imaging and Procedures for 02/22/21  OCT, Retina - OU - Both Eyes       Right Eye Quality was good. Scan  locations included subfoveal. Progression has worsened.   Left Eye Quality was good. Scan locations included subfoveal. Progression has been stable.   Notes OD macula off detachment         Color Fundus Photography Optos - OU - Both Eyes       Right Eye Progression has worsened. Disc findings include normal observations. Macula : detached. Vessels : normal observations. Periphery : tear, detachment.   Left Eye Progression has been stable.   Notes Macular detachment, with retinal detachment extending from the 7 o'clock position temporally and superotemporally to the 11 o'clock position macula off OD.  Large retinal break noted temporally.  Anterior enough to be supported by scleral buckle.                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  Possible side effects of the various surgical procedures were discussed with the patient.  Possible complications of surgery were discussed with the patient.  Explained the following success rates are involved with retinal detachment surgeries: 90% rate of reattachment success with primary operation, 85% chance of 2nd operation needed (if Pneumatic isn't done first), 70-75% chance of 3rd operation, & 50% chance of 4th & subsequent surgeries.   Patient understands that ongoing scarring of retina may lead to reoccurrence of retinal tears or detachments.  The patient's questions were answered. An informational brochure was given to the patient.  All the patient's questions were answered.  OD, phakic macula off retinal detachment.  Sudden onset.  Will need scleral buckle retinal cryopexy right eye.  We will plan on a general anesthesia      ICD-10-CM   1. 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 Posterior vitreous detachment of right eye  H43.811 Color Fundus Photography Optos - OU - Both Eyes  3. Lattice degeneration of retina, right eye  H35.411 Color Fundus Photography Optos - OU - Both Eyes    1.  I explained to the patient that the only way to repair retinal detachment is surgical intervention, explained that a scleral buckle is surgical procedure to place securely semipermanent basis a silicone plastic piece of material to indent the wall of the eye to help reattach the retina so that the visual acuity can slowly be regained over weeks to months.  Explained the patient that 9093% success rate with 1 surgery.  Often times late onset surgical scarring can develop that can trigger the need for other surgeries.  Also explained the fact that glasses prescription can change.  I think cataract can progress with the condition as well as the surgical repair and the use of topical medications thereafter  2.  3.  Ophthalmic Meds Ordered this visit:  No orders of the defined types were placed in this encounter.      Return ,, [facility], general via [facility], for We will schedule repair retinal detachment right eye via scleral buckle, retinal cryopexy .  There are no Patient Instructions on file for this visit.   Explained the diagnoses, plan, and follow up with the patient and they  expressed understanding.  Patient expressed understanding of the importance of proper follow up care.   Clent Demark Truly Stankiewicz M.D. Diseases & Surgery of the Retina and Vitreous Retina [facility] Diabetic [place] 02/22/21     Abbreviations: M myopia (nearsighted); A astigmatism; H hyperopia (farsighted); P presbyopia; Mrx spectacle prescription;  CTL contact lenses; OD right eye; OS left eye; OU both eyes  XT exotropia; ET esotropia; PEK punctate epithelial keratitis; PEE punctate epithelial erosions; DES dry eye syndrome; MGD meibomian gland dysfunction; ATs artificial tears; PFAT's  preservative free artificial tears; [place] nuclear sclerotic cataract; PSC posterior subcapsular cataract; ERM epi-retinal membrane; PVD posterior vitreous detachment; RD retinal detachment; DM diabetes mellitus; DR diabetic retinopathy; NPDR non-proliferative diabetic retinopathy; PDR proliferative diabetic retinopathy; CSME clinically significant macular edema; DME diabetic macular edema; dbh dot blot hemorrhages; CWS cotton wool spot; POAG primary open angle glaucoma; C/D cup-to-disc ratio; HVF humphrey visual field; GVF goldmann visual field; OCT optical coherence tomography; IOP intraocular pressure; BRVO Branch retinal vein occlusion; CRVO central retinal vein occlusion; CRAO central retinal artery occlusion; BRAO branch retinal artery occlusion; RT retinal tear; SB scleral buckle; PPV pars plana vitrectomy; VH Vitreous hemorrhage; PRP panretinal laser photocoagulation; IVK intravitreal kenalog; VMT vitreomacular traction; MH Macular hole;  NVD neovascularization of the disc; NVE neovascularization elsewhere; AREDS age related eye disease study; ARMD age related macular degeneration; POAG primary open angle glaucoma; EBMD epithelial/anterior basement membrane dystrophy; ACIOL anterior chamber intraocular lens; IOL intraocular lens; PCIOL posterior chamber intraocular lens; Phaco/IOL phacoemulsification with intraocular lens placement; [place] photorefractive keratectomy; LASIK laser assisted in situ keratomileusis; HTN hypertension; DM diabetes mellitus; COPD chronic obstructive pulmonary disease

## 2021-02-23 ENCOUNTER — Encounter (AMBULATORY_SURGERY_CENTER): Payer: 59 | Admitting: Ophthalmology

## 2021-02-23 DIAGNOSIS — H33021 Retinal detachment with multiple breaks, right eye: Secondary | ICD-10-CM | POA: Diagnosis not present

## 2021-02-24 ENCOUNTER — Encounter (INDEPENDENT_AMBULATORY_CARE_PROVIDER_SITE_OTHER): Payer: Self-pay | Admitting: Ophthalmology

## 2021-02-24 ENCOUNTER — Ambulatory Visit (INDEPENDENT_AMBULATORY_CARE_PROVIDER_SITE_OTHER): Payer: 59 | Admitting: Ophthalmology

## 2021-02-24 DIAGNOSIS — Z09 Encounter for follow-up examination after completed treatment for conditions other than malignant neoplasm: Secondary | ICD-10-CM

## 2021-02-24 DIAGNOSIS — H33021 Retinal detachment with multiple breaks, right eye: Secondary | ICD-10-CM

## 2021-02-24 NOTE — Assessment & Plan Note (Signed)
Ofloxacin  4 times daily to the operative eye  Prednisolone acetate 1 drop to the operative eye 4 times daily  Cyclogyl ( red top) 1 drop to the operative eye (right) once or twice daily as needed aching discomfort  Patient instructed not to refill the medications and use them for maximum of 3 weeks.  Patient instructed do not rub the eye.  Patient has the option to use the patch at night.  No lifting and bending for 1 week. No water IN the eye for 10 days. Do not rub the eye. Wear shield at night for 1-3 days.  Continue your topical medications for a total of 3 weeks.  Do not refill your postoperative medications unless instructed.  Refrain from exercise or intentional activity which increases our heart rate above resting levels.  Normal walking to complete normal activities of your day are appropriate.  Driving:  Legally, you only need one good eye, of 20/40 or better to drive.  However, the practice does not recommend driving during first weeks after surgery, IF you are uncomfortable with your visual functioning or capabilities.   If you have known sleep apnea, wear your CPAP as you normally should.

## 2021-02-24 NOTE — Assessment & Plan Note (Signed)
Postoperative day #1  Positioning reviewed including demonstration of steam rolling technique.  Patient asked to perform the facedown positioning during the day with slow steam roller to close the superotemporal breaks, once today and then position down with left ear towards left shouler .  Sleep left side down.  Otherwise position as noted with the patient and the marking placed upon the forehead and reviewed with patient and husband   Will plan follow-up in 3 days office

## 2021-02-24 NOTE — Patient Instructions (Signed)
Ofloxacin  4 times daily to the operative eye  Prednisolone acetate 1 drop to the operative eye 4 times daily  Patient instructed not to refill the medications and use them for maximum of 3 weeks.  Patient instructed do not rub the eye.  Patient has the option to use the patch at night.  The patient was found to be doing well, postoperatively, and was advised in the use of drops and home care.  Patient was advised not to rub eyes. Positioning was described as well, as precautions regarding intravitreal gas, if applicable. DO NOT TRAVEL TO [place], OR IN AIRPLANE, UNTIL THE BUBBLE INSIDE THE EYE HAS DISAPPEARED.  The use of eye patch at night is optional and was discussed. May use paper tape with patch if patient has dry skin and transpore tape for oily skin.   Use topical medications as ordered. 

## 2021-02-24 NOTE — Progress Notes (Signed)
02/24/2021     CHIEF COMPLAINT Patient presents for Post-op Follow-up   HISTORY OF PRESENT ILLNESS: Valerie Heath is a 70 y.o. female who presents to the clinic today for:   HPI    Post-op Follow-up    Laterality: right eye   Discomfort: discharge   Vision: is stable   MD Performed: performed the HPI with the patient and updated documentation appropriately          Comments    Postop day 1, status post repair retinal attachment via scleral buckle, external drainage of subretinal fluid, injection of SF 6 100% concentration, 0.2 cc, for macula off retinal detachment temporally, multiple retinal breaks temporal and superotemporal OD       Last edited by Hurman Horn, MD on 02/24/2021  8:02 AM. (History)      Referring physician: No referring provider defined for this encounter.  HISTORICAL INFORMATION:   Selected notes from the MEDICAL RECORD NUMBER       CURRENT MEDICATIONS: Current Outpatient Medications (Ophthalmic Drugs)  Medication Sig  . cyclopentolate (CYCLOGYL) 1 % ophthalmic solution Place 1 drop into the right eye 2 (two) times daily for 21 days.  Marland Kitchen ofloxacin (OCUFLOX) 0.3 % ophthalmic solution Place 1 drop into the right eye 4 (four) times daily for 21 days.  . prednisoLONE acetate (PRED FORTE) 1 % ophthalmic suspension Place 1 drop into the right eye 4 (four) times daily for 21 days.   No current facility-administered medications for this visit. (Ophthalmic Drugs)   Current Outpatient Medications (Other)  Medication Sig  . anastrozole (ARIMIDEX) 1 MG tablet TAKE 1 TABLET BY MOUTH EVERY DAY  . Cholecalciferol (VITAMIN D) 50 MCG (2000 UT) CAPS Take 1 capsule (2,000 Units total) by mouth daily.  . clobetasol cream (TEMOVATE) 0.05 % Apply to affected area as needed  . levothyroxine (SYNTHROID) 112 MCG tablet Take 1 tablet (112 mcg total) by mouth daily before breakfast.  . valACYclovir (VALTREX) 500 MG tablet TAKE 4 TABLETS BY MOUTH ON START OF UCLERATION,  THEN 4 TABLETS 12 HOURS LATER   No current facility-administered medications for this visit. (Other)      REVIEW OF SYSTEMS:    ALLERGIES Allergies  Allergen Reactions  . Erythromycin Nausea Only    PAST MEDICAL HISTORY Past Medical History:  Diagnosis Date  . Breast cancer ([place])   . Breast cancer of upper-outer quadrant of left female breast ([place])   . Breast cancer of upper-outer quadrant of right female breast ([place])   . Cancer ([place])    breast cancer-lt  . Fibroid   . Hypothyroid   . Personal history of radiation therapy   . Radiation 06/16/14-08/01/14   left and right breast    Past Surgical History:  Procedure Laterality Date  . BREAST BIOPSY    . BREAST LUMPECTOMY Bilateral    2015  . BREAST LUMPECTOMY WITH RADIOACTIVE SEED LOCALIZATION Left 02/21/2014   Procedure: BREAST LUMPECTOMY WITH RADIOACTIVE SEED LOCALIZATION;  Surgeon: Adin Hector, MD;  Location: [facility];  Service: General;  Laterality: Left;  . BREAST SURGERY Right 04/2014   Lumpectomy   . BREAST SURGERY Left   . COLONOSCOPY    . LYMPH NODE DISSECTION  04/27/14   right and left  . MYOMECTOMY    . REFRACTIVE SURGERY     X 2  . SHOULDER SURGERY  2005   left  . TONSILLECTOMY AND ADENOIDECTOMY      FAMILY HISTORY Family  History  Problem Relation Age of Onset  . Hypertension Mother   . Breast cancer Mother 23  . Cancer Mother 30       breast cancer at 67 ; Fallopian tube tumor in her 2s  . Diabetes Father   . Hypertension Father   . Heart disease Father   . Prostate cancer Father   . Cancer Father 17       prostate cancer  . Colon cancer Paternal Uncle   . Cancer Paternal Uncle 30       colon cancer  . Throat cancer Maternal Uncle   . Cancer Maternal Uncle 45       prostate cancer  . Prostate cancer Maternal Uncle   . Breast cancer Maternal Aunt 43  . Leukemia Paternal Grandfather   . Leukemia Maternal Aunt   . Cancer Paternal Uncle 84       brain cancer  . Brain  cancer Paternal Uncle     SOCIAL HISTORY Social History   Tobacco Use  . Smoking status: Never Smoker  . Smokeless tobacco: Never Used  Vaping Use  . Vaping Use: Never used  Substance Use Topics  . Alcohol use: Yes    Alcohol/week: 2.0 standard drinks    Types: 2 Standard drinks or equivalent per week    Comment: Socially  . Drug use: No         OPHTHALMIC EXAM:  Base Eye Exam    Visual Acuity (ETDRS)      Right Left   Dist [place] CF at 3'    Dist ph [place] NI        Tonometry (Tonopen, 8:06 AM)      Right Left   Pressure 20        Neuro/Psych    Oriented x3: Yes   Mood/Affect: Normal        Slit Lamp and Fundus Exam    External Exam      Right Left   External Normal Normal       Slit Lamp Exam      Right Left   Lids/Lashes Normal Normal   Conjunctiva/Sclera 1+ Chemosis, 2+ Injection, 1+ Subconjunctival hemorrhage conjunctiva well closed, with Vicryl White and quiet   Cornea Clear Clear   Anterior Chamber Deep and quiet Deep and quiet   Iris Round and reactive Round and reactive   Lens 2+ Nuclear sclerosis, 1+ Cortical cataract 2+ Nuclear sclerosis   Anterior Vitreous cell 1+, RBC Normal, no pigment       Fundus Exam      Right Left   Posterior Vitreous ,, Posterior vitreous detachment    C/D Ratio 0.2    Macula Normal,, attached, flat no fluid    Vessels Normal    Periphery Good high scleral buckle temporally,  287 tire,  good cryopexy temporal and superotemporal.,  Encircling band 360 no fluid           IMAGING AND PROCEDURES  Imaging and Procedures for 02/24/21           ASSESSMENT/PLAN:  Postoperative follow-up Ofloxacin  4 times daily to the operative eye  Prednisolone acetate 1 drop to the operative eye 4 times daily  Cyclogyl ( red top) 1 drop to the operative eye (right) once or twice daily as needed aching discomfort  Patient instructed not to refill the medications and use them for maximum of 3 weeks.  Patient instructed do  not rub the eye.  Patient has the option  to use the patch at night.  No lifting and bending for 1 week. No water IN the eye for 10 days. Do not rub the eye. Wear shield at night for 1-3 days.  Continue your topical medications for a total of 3 weeks.  Do not refill your postoperative medications unless instructed.  Refrain from exercise or intentional activity which increases our heart rate above resting levels.  Normal walking to complete normal activities of your day are appropriate.  Driving:  Legally, you only need one good eye, of 20/40 or better to drive.  However, the practice does not recommend driving during first weeks after surgery, IF you are uncomfortable with your visual functioning or capabilities.   If you have known sleep apnea, wear your CPAP as you normally should.     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  Patient asked to perform the facedown positioning during the day with slow steam roller to close the superotemporal breaks, once today and then position down with left ear towards left shouler .  Sleep left side down.  Otherwise position as noted with the patient and the marking placed upon the forehead and reviewed with patient and husband   Will plan follow-up in 3 days office      ICD-10-CM   1. Postoperative follow-up  Z09   2. 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  OD postoperative day #1, retina reattached nicely.  Positioning reviewed.  We will still use the gas bubble for flattening of the center of the vision followed thereafter by stimulator technique to close the temporal breaks and superotemporal breaks for long-term stability of retinal detachment  repair.  2.  We will follow-up in 3 days.  3.  Ophthalmic Meds Ordered this visit:  No orders of the defined types were placed in this encounter.       Return in about 3 days (around 02/27/2021) for dilate, OD, POST OP, COLOR FP.  Patient Instructions  Ofloxacin  4 times daily to the operative eye  Prednisolone acetate 1 drop to the operative eye 4 times daily  Patient instructed not to refill the medications and use them for maximum of 3 weeks.  Patient instructed do not rub the eye.  Patient has the option to use the patch at night.    The patient was found to be doing well, postoperatively, and was advised in the use of drops and home care.  Patient was advised not to rub eyes. Positioning was described as well, as precautions regarding intravitreal gas, if applicable. DO NOT TRAVEL TO MOUNTAINS, OR IN AIRPLANE, UNTIL THE BUBBLE INSIDE THE EYE HAS DISAPPEARED.  The use of eye patch at night is optional and was discussed. May use paper tape with patch if patient has dry skin and transpore tape for oily skin.   Use topical medications as ordered.    Explained the diagnoses, plan, and follow up with the patient and they expressed understanding.  Patient expressed understanding of the importance of proper follow up care.   Clent Demark Lizann Edelman M.D. Diseases & Surgery of the Retina and Vitreous Retina & Diabetic [place] 02/24/21     Abbreviations: M myopia (nearsighted); A astigmatism; H hyperopia (farsighted); P presbyopia; Mrx spectacle prescription;  CTL contact lenses; OD right eye; OS left eye; OU both eyes  XT exotropia; ET esotropia; PEK punctate epithelial keratitis;  PEE punctate epithelial erosions; DES dry eye syndrome; MGD meibomian gland dysfunction; ATs artificial tears; PFAT's preservative free artificial tears; [place] nuclear sclerotic cataract; PSC posterior subcapsular cataract; ERM epi-retinal membrane; PVD posterior vitreous detachment; RD retinal detachment; DM diabetes mellitus; DR diabetic retinopathy; NPDR non-proliferative diabetic retinopathy; PDR proliferative diabetic retinopathy; CSME clinically significant macular  edema; DME diabetic macular edema; dbh dot blot hemorrhages; CWS cotton wool spot; POAG primary open angle glaucoma; C/D cup-to-disc ratio; HVF humphrey visual field; GVF goldmann visual field; OCT optical coherence tomography; IOP intraocular pressure; BRVO Branch retinal vein occlusion; CRVO central retinal vein occlusion; CRAO central retinal artery occlusion; BRAO branch retinal artery occlusion; RT retinal tear; SB scleral buckle; PPV pars plana vitrectomy; VH Vitreous hemorrhage; PRP panretinal laser photocoagulation; IVK intravitreal kenalog; VMT vitreomacular traction; MH Macular hole;  NVD neovascularization of the disc; NVE neovascularization elsewhere; AREDS age related eye disease study; ARMD age related macular degeneration; POAG primary open angle glaucoma; EBMD epithelial/anterior basement membrane dystrophy; ACIOL anterior chamber intraocular lens; IOL intraocular lens; PCIOL posterior chamber intraocular lens; Phaco/IOL phacoemulsification with intraocular lens placement; [place] photorefractive keratectomy; LASIK laser assisted in situ keratomileusis; HTN hypertension; DM diabetes mellitus; COPD chronic obstructive pulmonary disease

## 2021-02-26 ENCOUNTER — Encounter (INDEPENDENT_AMBULATORY_CARE_PROVIDER_SITE_OTHER): Payer: Self-pay | Admitting: Ophthalmology

## 2021-02-26 ENCOUNTER — Other Ambulatory Visit: Payer: Self-pay

## 2021-02-26 ENCOUNTER — Ambulatory Visit (INDEPENDENT_AMBULATORY_CARE_PROVIDER_SITE_OTHER): Payer: 59 | Admitting: Ophthalmology

## 2021-02-26 DIAGNOSIS — H33021 Retinal detachment with multiple breaks, right eye: Secondary | ICD-10-CM

## 2021-02-26 NOTE — Assessment & Plan Note (Signed)
Postop day #3 status post scleral buckle, injection intravitreal gas bubble OD.  Positioning has been excellent according to patient.  Visual acuity OD has improved on the eye chart now yet on fundus exam there is corrugations of the retina and potential small amount of fluid on the buckle between the 7 and 9:00 meridian's suggesting ongoing traction and need for flattening with the remainder of intravitreal substitute-SF6 OD

## 2021-02-26 NOTE — Progress Notes (Signed)
02/26/2021     CHIEF COMPLAINT Patient presents for No chief complaint on file.   HISTORY OF PRESENT ILLNESS: Valerie Heath is a 70 y.o. female who presents to the clinic today for:   HPI    Postop day 3, status post repair retinal attachment via scleral buckle, external drainage of subretinal fluid, injection of SF 6 100% concentration, 0.2 cc, for macula off retinal detachment temporally, multiple retinal breaks temporal and superotemporal OD, no change in acuity noted by patient yet upper lid still swollen OD     Last edited by Hurman Horn, MD on 02/26/2021  8:16 AM. (History)      Referring physician: Ginger Organ., MD [place],  [place] [id number]  HISTORICAL INFORMATION:   Selected notes from the [facility]: Current Outpatient Medications (Ophthalmic Drugs)  Medication Sig  . cyclopentolate (CYCLOGYL) 1 % ophthalmic solution Place 1 drop into the right eye 2 (two) times daily for 21 days.  Marland Kitchen ofloxacin (OCUFLOX) 0.3 % ophthalmic solution Place 1 drop into the right eye 4 (four) times daily for 21 days.  . prednisoLONE acetate (PRED FORTE) 1 % ophthalmic suspension Place 1 drop into the right eye 4 (four) times daily for 21 days.   No current facility-administered medications for this visit. (Ophthalmic Drugs)   Current Outpatient Medications (Other)  Medication Sig  . anastrozole (ARIMIDEX) 1 MG tablet TAKE 1 TABLET BY MOUTH EVERY DAY  . Cholecalciferol (VITAMIN D) 50 MCG (2000 UT) CAPS Take 1 capsule (2,000 Units total) by mouth daily.  . clobetasol cream (TEMOVATE) 0.05 % Apply to affected area as needed  . levothyroxine (SYNTHROID) 112 MCG tablet Take 1 tablet (112 mcg total) by mouth daily before breakfast.  . valACYclovir (VALTREX) 500 MG tablet TAKE 4 TABLETS BY MOUTH ON START OF UCLERATION, THEN 4 TABLETS 12 HOURS LATER   No current facility-administered medications for this visit. (Other)       REVIEW OF SYSTEMS:    ALLERGIES Allergies  Allergen Reactions  . Erythromycin Nausea Only    PAST MEDICAL HISTORY Past Medical History:  Diagnosis Date  . Breast cancer ([place])   . Breast cancer of upper-outer quadrant of left female breast ([place])   . Breast cancer of upper-outer quadrant of right female breast ([place])   . Cancer ([place])    breast cancer-lt  . Fibroid   . Hypothyroid   . Personal history of radiation therapy   . Radiation 06/16/14-08/01/14   left and right breast    Past Surgical History:  Procedure Laterality Date  . BREAST BIOPSY    . BREAST LUMPECTOMY Bilateral    2015  . BREAST LUMPECTOMY WITH RADIOACTIVE SEED LOCALIZATION Left 02/21/2014   Procedure: BREAST LUMPECTOMY WITH RADIOACTIVE SEED LOCALIZATION;  Surgeon: Adin Hector, MD;  Location: [facility];  Service: General;  Laterality: Left;  . BREAST SURGERY Right 04/2014   Lumpectomy   . BREAST SURGERY Left   . COLONOSCOPY    . LYMPH NODE DISSECTION  04/27/14   right and left  . MYOMECTOMY    . REFRACTIVE SURGERY     X 2  . SHOULDER SURGERY  2005   left  . TONSILLECTOMY AND ADENOIDECTOMY      FAMILY HISTORY Family History  Problem Relation Age of Onset  . Hypertension Mother   . Breast cancer Mother 34  . Cancer Mother 36  breast cancer at 53 ; Fallopian tube tumor in her 25s  . Diabetes Father   . Hypertension Father   . Heart disease Father   . Prostate cancer Father   . Cancer Father 85       prostate cancer  . Colon cancer Paternal Uncle   . Cancer Paternal Uncle 69       colon cancer  . Throat cancer Maternal Uncle   . Cancer Maternal Uncle 45       prostate cancer  . Prostate cancer Maternal Uncle   . Breast cancer Maternal Aunt 78  . Leukemia Paternal Grandfather   . Leukemia Maternal Aunt   . Cancer Paternal Uncle 80       brain cancer  . Brain cancer Paternal Uncle     SOCIAL HISTORY Social History   Tobacco Use  . Smoking status: Never  Smoker  . Smokeless tobacco: Never Used  Vaping Use  . Vaping Use: Never used  Substance Use Topics  . Alcohol use: Yes    Alcohol/week: 2.0 standard drinks    Types: 2 Standard drinks or equivalent per week    Comment: Socially  . Drug use: No         OPHTHALMIC EXAM:  Base Eye Exam    Visual Acuity (ETDRS)      Right Left   Dist [place] 20/200    Dist ph [place] 20/100        Tonometry (Tonopen, 8:16 AM)      Right Left   Pressure 20        Neuro/Psych    Oriented x3: Yes   Mood/Affect: Normal        Slit Lamp and Fundus Exam    External Exam      Right Left   External Normal Normal       Slit Lamp Exam      Right Left   Lids/Lashes Normal Normal   Conjunctiva/Sclera 1+ Chemosis, 2+ Injection, 1+ Subconjunctival hemorrhage conjunctiva well closed, with Vicryl White and quiet   Cornea Clear Clear   Anterior Chamber Deep and quiet Deep and quiet   Iris Round and reactive Round and reactive   Lens 2+ Nuclear sclerosis, 1+ Cortical cataract 2+ Nuclear sclerosis   Anterior Vitreous cell 1+, RBC Normal, no pigment       Fundus Exam      Right Left   Posterior Vitreous ,, Posterior vitreous detachment, 20% gas in place    Disc Normal    C/D Ratio 0.2    Macula Normal,, attached, flat no fluid    Vessels Normal    Periphery Good high scleral buckle temporally,  287 tire,  good cryopexy temporal and superotemporal.,  Encircling band 360 no fluid,, yet with some corrugation of the retina between the 7 and 9:00 positions temporally on the scleral buckle slightly elevated, weeks flattening with recurrent gas bubble in place           IMAGING AND PROCEDURES  Imaging and Procedures for 02/26/21           ASSESSMENT/PLAN:  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  Positioning has been excellent according to patient.  Visual acuity OD has improved on the eye chart now yet  on fundus exam there is corrugations of the retina and potential small amount of fluid on the buckle between the 7 and 9:00 meridian's suggesting ongoing  traction and need for flattening with the remainder of intravitreal substitute-SF6 OD      ICD-10-CM   1. 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  Improving acuity OD yet with small amount of fluid overlying the temporal and inferotemporal scleral buckle.  We will position aggressively in this position to protect the center of the vision but more importantly to flatten this area with horizontal positioning left side down during waking hours as well as at night  2.  3.  Ophthalmic Meds Ordered this visit:  No orders of the defined types were placed in this encounter.      Return in about 2 days (around 02/28/2021) for dilate, OD, COLOR FP.  Patient Instructions  DO NOT SLEEP  OR REST ON BACK.  REST OR SLEEP ON SIDE, THE LEFT SIDE IS PREFERABLE.  MAY ALSO LOOK FACE DOWN IF ABLE.  IF INSTRUCTED BY DR Zadie Rhine, LOOK FACE DOWN DURING YOUR WAKING HOURS   During waking hours facedown positioning for an hour followed thereafter by "steam roller technique" which means transitioning directly to a left ear down horizontal position lying on left side for another hour.  Thereafter take 10 to 15-minute break while still sitting upright and looking slightly down and then repeating this technique 4 times during the day during her waking hours perform this for the next 2 days  The patient was found to be doing well, postoperatively, and was advised in the use of drops and home care.  Patient was advised not to rub eyes. Positioning was described as well, as precautions regarding intravitreal gas, if applicable. DO NOT TRAVEL TO [place], OR IN AIRPLANE, UNTIL THE BUBBLE INSIDE THE EYE HAS DISAPPEARED.  The use of eye patch at night is optional and was discussed. May use paper tape with patch if patient has dry skin and  transpore tape for oily skin.   Use topical medications as ordered.    Explained the diagnoses, plan, and follow up with the patient and they expressed understanding.  Patient expressed understanding of the importance of proper follow up care.   Clent Demark Terease Marcotte M.D. Diseases & Surgery of the Retina and Vitreous Retina & Diabetic [place] 02/26/21     Abbreviations: M myopia (nearsighted); A astigmatism; H hyperopia (farsighted); P presbyopia; Mrx spectacle prescription;  CTL contact lenses; OD right eye; OS left eye; OU both eyes  XT exotropia; ET esotropia; PEK punctate epithelial keratitis; PEE punctate epithelial erosions; DES dry eye syndrome; MGD meibomian gland dysfunction; ATs artificial tears; PFAT's preservative free artificial tears; [place] nuclear sclerotic cataract; PSC posterior subcapsular cataract; ERM epi-retinal membrane; PVD posterior vitreous detachment; RD retinal detachment; DM diabetes mellitus; DR diabetic retinopathy; NPDR non-proliferative diabetic retinopathy; PDR proliferative diabetic retinopathy; CSME clinically significant macular edema; DME diabetic macular edema; dbh dot blot hemorrhages; CWS cotton wool spot; POAG primary open angle glaucoma; C/D cup-to-disc ratio; HVF humphrey visual field; GVF goldmann visual field; OCT optical coherence tomography; IOP intraocular pressure; BRVO Branch retinal vein occlusion; CRVO central retinal vein occlusion; CRAO central retinal artery occlusion; BRAO branch retinal artery occlusion; RT retinal tear; SB scleral buckle; PPV pars plana vitrectomy; VH Vitreous hemorrhage; PRP panretinal laser photocoagulation; IVK intravitreal kenalog; VMT vitreomacular traction; MH Macular hole;  NVD neovascularization of the disc; NVE neovascularization elsewhere; AREDS age related eye disease study; ARMD age related macular degeneration; POAG primary open angle glaucoma; EBMD epithelial/anterior basement membrane dystrophy; ACIOL anterior chamber  intraocular lens;  IOL intraocular lens; PCIOL posterior chamber intraocular lens; Phaco/IOL phacoemulsification with intraocular lens placement; [place] photorefractive keratectomy; LASIK laser assisted in situ keratomileusis; HTN hypertension; DM diabetes mellitus; COPD chronic obstructive pulmonary disease

## 2021-02-26 NOTE — Patient Instructions (Signed)
DO NOT SLEEP  OR REST ON BACK.  REST OR SLEEP ON SIDE, THE LEFT SIDE IS PREFERABLE.  MAY ALSO LOOK FACE DOWN IF ABLE.  IF INSTRUCTED BY DR Zadie Rhine, LOOK FACE DOWN DURING YOUR WAKING HOURS   During waking hours facedown positioning for an hour followed thereafter by "steam roller technique" which means transitioning directly to a left ear down horizontal position lying on left side for another hour.  Thereafter take 10 to 15-minute break while still sitting upright and looking slightly down and then repeating this technique 4 times during the day during her waking hours perform this for the next 2 days  The patient was found to be doing well, postoperatively, and was advised in the use of drops and home care.  Patient was advised not to rub eyes. Positioning was described as well, as precautions regarding intravitreal gas, if applicable. DO NOT TRAVEL TO MOUNTAINS, OR IN AIRPLANE, UNTIL THE BUBBLE INSIDE THE EYE HAS DISAPPEARED.  The use of eye patch at night is optional and was discussed. May use paper tape with patch if patient has dry skin and transpore tape for oily skin.   Use topical medications as ordered.

## 2021-02-28 ENCOUNTER — Encounter (INDEPENDENT_AMBULATORY_CARE_PROVIDER_SITE_OTHER): Payer: Self-pay | Admitting: Ophthalmology

## 2021-02-28 ENCOUNTER — Other Ambulatory Visit: Payer: Self-pay

## 2021-02-28 ENCOUNTER — Ambulatory Visit (INDEPENDENT_AMBULATORY_CARE_PROVIDER_SITE_OTHER): Payer: 59 | Admitting: Ophthalmology

## 2021-02-28 DIAGNOSIS — H2513 Age-related nuclear cataract, bilateral: Secondary | ICD-10-CM

## 2021-02-28 DIAGNOSIS — H3341 Traction detachment of retina, right eye: Secondary | ICD-10-CM

## 2021-02-28 NOTE — Assessment & Plan Note (Addendum)
The nature of retinal detachment was discussed with the patient as well, as the treatment options which include pneumatic retinopexy, scleral buckling, and vitrectomy scleral buckling.  Possible side effects of the various surgical procedures were discussed with the patient.  Possible complications of surgery were discussed with the patient.  Explained the following success rates are involved with retinal detachment surgeries: 90% rate of reattachment success with primary operation, 85% chance of 2nd operation needed (if Pneumatic isn't done first), 70-75% chance of 3rd operation, & 50% chance of 4th & subsequent surgeries.   Patient understands that ongoing scarring of retina may lead to reoccurrence of retinal tears or detachments.  The patient's questions were answered. An informational brochure was given to the patient.  All the patient's questions were answered.  OD will need vitrectomy with laser gas possible oil injection, but most importantly will need cataract extraction with intraocular lens placement concomitant or prior to surgery to repair retina  We will coordinate with Dr. Cherlynn Kaiser office

## 2021-02-28 NOTE — Progress Notes (Signed)
02/28/2021     CHIEF COMPLAINT Patient presents for Post-op Follow-up (Post op OD Sx on 02/23/2021/Pt states, "I feel a little better today. But when I am doing face down positioning I am having a lot of pressure and pain. I can open my eye a little today."/Pt reports using Ocuflox and Pred QID OD and Cyclo BID OD)   HISTORY OF PRESENT ILLNESS: Valerie Heath is a 70 y.o. female who presents to the clinic today for:   HPI    Post-op Follow-up    Laterality: right eye   Discomfort: pain (Not sharp pain, just a dull constant ache), foreign body sensation and tearing.  Negative for itching   Vision: is stable   Comments: Post op OD Sx on 02/23/2021 Pt states, "I feel a little better today. But when I am doing face down positioning I am having a lot of pressure and pain. I can open my eye a little today." Pt reports using Ocuflox and Pred QID OD and Cyclo BID OD       Last edited by Kendra Opitz, COA on 02/28/2021  7:56 AM. (History)      Referring physician: Ginger Organ., MD [place],  [place] [address]  HISTORICAL INFORMATION:   Selected notes from the MEDICAL RECORD NUMBER       CURRENT MEDICATIONS: Current Outpatient Medications (Ophthalmic Drugs)  Medication Sig  . cyclopentolate (CYCLOGYL) 1 % ophthalmic solution Place 1 drop into the right eye 2 (two) times daily for 21 days.  Marland Kitchen ofloxacin (OCUFLOX) 0.3 % ophthalmic solution Place 1 drop into the right eye 4 (four) times daily for 21 days.  . prednisoLONE acetate (PRED FORTE) 1 % ophthalmic suspension Place 1 drop into the right eye 4 (four) times daily for 21 days.   No current facility-administered medications for this visit. (Ophthalmic Drugs)   Current Outpatient Medications (Other)  Medication Sig  . anastrozole (ARIMIDEX) 1 MG tablet TAKE 1 TABLET BY MOUTH EVERY DAY  . Cholecalciferol (VITAMIN D) 50 MCG (2000 UT) CAPS Take 1 capsule (2,000 Units total) by mouth daily.  . clobetasol cream  (TEMOVATE) 0.05 % Apply to affected area as needed  . levothyroxine (SYNTHROID) 112 MCG tablet Take 1 tablet (112 mcg total) by mouth daily before breakfast.  . valACYclovir (VALTREX) 500 MG tablet TAKE 4 TABLETS BY MOUTH ON START OF UCLERATION, THEN 4 TABLETS 12 HOURS LATER   No current facility-administered medications for this visit. (Other)      REVIEW OF SYSTEMS:    ALLERGIES Allergies  Allergen Reactions  . Erythromycin Nausea Only    PAST MEDICAL HISTORY Past Medical History:  Diagnosis Date  . Breast cancer ([place])   . Breast cancer of upper-outer quadrant of left female breast ([place])   . Breast cancer of upper-outer quadrant of right female breast ([place])   . Cancer ([place])    breast cancer-lt  . Fibroid   . Hypothyroid   . Personal history of radiation therapy   . Radiation 06/16/14-08/01/14   left and right breast    Past Surgical History:  Procedure Laterality Date  . BREAST BIOPSY    . BREAST LUMPECTOMY Bilateral    2015  . BREAST LUMPECTOMY WITH RADIOACTIVE SEED LOCALIZATION Left 02/21/2014   Procedure: BREAST LUMPECTOMY WITH RADIOACTIVE SEED LOCALIZATION;  Surgeon: Adin Hector, MD;  Location: [facility];  Service: General;  Laterality: Left;  . BREAST SURGERY Right 04/2014   Lumpectomy   .  BREAST SURGERY Left   . COLONOSCOPY    . LYMPH NODE DISSECTION  04/27/14   right and left  . MYOMECTOMY    . REFRACTIVE SURGERY     X 2  . SHOULDER SURGERY  2005   left  . TONSILLECTOMY AND ADENOIDECTOMY      FAMILY HISTORY Family History  Problem Relation Age of Onset  . Hypertension Mother   . Breast cancer Mother 53  . Cancer Mother 41       breast cancer at 46 ; Fallopian tube tumor in her 30s  . Diabetes Father   . Hypertension Father   . Heart disease Father   . Prostate cancer Father   . Cancer Father 54       prostate cancer  . Colon cancer Paternal Uncle   . Cancer Paternal Uncle 32       colon cancer  . Throat cancer Maternal Uncle    . Cancer Maternal Uncle 45       prostate cancer  . Prostate cancer Maternal Uncle   . Breast cancer Maternal Aunt 3  . Leukemia Paternal Grandfather   . Leukemia Maternal Aunt   . Cancer Paternal Uncle 62       brain cancer  . Brain cancer Paternal Uncle     SOCIAL HISTORY Social History   Tobacco Use  . Smoking status: Never Smoker  . Smokeless tobacco: Never Used  Vaping Use  . Vaping Use: Never used  Substance Use Topics  . Alcohol use: Yes    Alcohol/week: 2.0 standard drinks    Types: 2 Standard drinks or equivalent per week    Comment: Socially  . Drug use: No         OPHTHALMIC EXAM: Base Eye Exam    Visual Acuity (ETDRS)      Right Left   Dist [place] 20/400 20/25   Dist ph [place] NI        Tonometry (Tonopen, 8:00 AM)      Right Left   Pressure 13 14       Pupils      Pupils Dark Light Shape React APD   Right PERRL 7 7 Round     Left PERRL 4 3 Round Brisk None       Visual Fields      Left Right    Full    Restrictions  Partial outer superior temporal, superior nasal, inferior nasal deficiencies       Neuro/Psych    Oriented x3: Yes   Mood/Affect: Normal       Dilation    Right eye: 1.0% Mydriacyl, 2.5% Phenylephrine @ 8:00 AM        Slit Lamp and Fundus Exam    External Exam      Right Left   External Normal Normal       Slit Lamp Exam      Right Left   Lids/Lashes Normal Normal   Conjunctiva/Sclera 1+ Chemosis, 2+ Injection, 1+ Subconjunctival hemorrhage conjunctiva well closed, with Vicryl White and quiet   Cornea Clear Clear   Anterior Chamber Deep and quiet Deep and quiet   Iris Round and reactive Round and reactive   Lens 2+ Nuclear sclerosis, 1+ Cortical cataract 2+ Nuclear sclerosis   Anterior Vitreous cell 1+, RBC Normal, no pigment       Fundus Exam      Right Left   Posterior Vitreous ,, Posterior vitreous detachment, 20% gas in place  Disc Normal    C/D Ratio 0.2    Macula , Fovea attached yet yet temporal  maculr elevated     Vessels Normal    Periphery Good high scleral buckle temporally,  287 tire,  good cryopexy temporal and superotemporal.,  Encircling band 360, breaks elevated at 10 9 and 8 fluid from 7-10 o'clock superiorly and extending posteriorly, Large tire temporally, Traction RD           IMAGING AND PROCEDURES  Imaging and Procedures for 02/28/21           ASSESSMENT/PLAN:  Traction retinal detachment involving macula, right OD will need vitrectomy with laser gas possible oil injection, but most importantly will need cataract extraction with intraocular lens placement concomitant or prior to surgery to repair retina  We will coordinate with Dr. 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  Thus anatomically and visually cataract extraction with intraocular lens placement needs to be combined with surgical repair retinal detachment.  We are coordinating with Dr. Cherlynn Kaiser office.      ICD-10-CM   1. Traction retinal detachment involving macula, right  H33.41   2. Age-related nuclear cataract of both eyes  H25.13     1.  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  2.  Presence of moderate to advanced nuclear sclerosis mechanically as well as visually hampers repair retinal detachment preventing a lot and not allowing removal of the anterior hyaloid and visualization posteriorly.  3.  Discussion with Dr. Quentin Ore today.  Will plan repair retinal detachment in combination with  counter extraction with intraocular lens placement followed soon thereafter that same operating room session with vitrectomy, endolaser, drainage of subretinal fluid, injection a gas possible silicone oil right eye   4.  Patient asked and instructed to remain on topical prednisolone acetate 1 drop right eye 4 times daily  Patient asked to continue on topical ofloxacin 1 drop right eye 4 times daily  Patient has the option to use Cyclogyl 1 drop once or twice daily as needed discomfort achy pain  Ophthalmic Meds Ordered this visit:  No orders of the defined types were placed in this encounter.      Return , [facility], combination cataract removal with Dr. Quentin Ore, for Schedule repair retinal detachment right eye via vitrectomy, laser, likely gas [place] [id number].  There are no Patient Instructions on file for this visit.   Explained the diagnoses, plan, and follow up with the patient and they expressed understanding.  Patient expressed understanding of the importance of proper follow up care.   Clent Demark Unique Sillas M.D. Diseases & Surgery of the Retina and Vitreous Retina & Diabetic [place] 02/28/21     Abbreviations: M myopia (nearsighted); A astigmatism; H hyperopia (farsighted); P presbyopia; Mrx spectacle prescription;  CTL contact lenses; OD right eye; OS left eye; OU both eyes  XT exotropia; ET esotropia; PEK punctate epithelial keratitis; PEE punctate epithelial erosions; DES dry eye syndrome; MGD meibomian gland dysfunction; ATs artificial tears; PFAT's preservative free artificial tears; [place] nuclear sclerotic cataract; PSC posterior subcapsular  cataract; ERM epi-retinal membrane; PVD posterior vitreous detachment; RD retinal detachment; DM diabetes mellitus; DR diabetic retinopathy; NPDR non-proliferative diabetic retinopathy; PDR proliferative diabetic retinopathy; CSME clinically significant macular edema; DME diabetic macular edema; dbh dot blot  hemorrhages; CWS cotton wool spot; POAG primary open angle glaucoma; C/D cup-to-disc ratio; HVF humphrey visual field; GVF goldmann visual field; OCT optical coherence tomography; IOP intraocular pressure; BRVO Branch retinal vein occlusion; CRVO central retinal vein occlusion; CRAO central retinal artery occlusion; BRAO branch retinal artery occlusion; RT retinal tear; SB scleral buckle; PPV pars plana vitrectomy; VH Vitreous hemorrhage; PRP panretinal laser photocoagulation; IVK intravitreal kenalog; VMT vitreomacular traction; MH Macular hole;  NVD neovascularization of the disc; NVE neovascularization elsewhere; AREDS age related eye disease study; ARMD age related macular degeneration; POAG primary open angle glaucoma; EBMD epithelial/anterior basement membrane dystrophy; ACIOL anterior chamber intraocular lens; IOL intraocular lens; PCIOL posterior chamber intraocular lens; Phaco/IOL phacoemulsification with intraocular lens placement; [place] photorefractive keratectomy; LASIK laser assisted in situ keratomileusis; HTN hypertension; DM diabetes mellitus; COPD chronic obstructive pulmonary disease

## 2021-02-28 NOTE — Assessment & Plan Note (Signed)
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  Thus anatomically and visually cataract extraction with intraocular lens placement needs to be combined with surgical repair retinal detachment.  We are coordinating with Dr. Cherlynn Kaiser office.

## 2021-03-01 ENCOUNTER — Encounter (INDEPENDENT_AMBULATORY_CARE_PROVIDER_SITE_OTHER): Payer: 59 | Admitting: Ophthalmology

## 2021-03-01 ENCOUNTER — Encounter (INDEPENDENT_AMBULATORY_CARE_PROVIDER_SITE_OTHER): Payer: Self-pay

## 2021-03-07 ENCOUNTER — Encounter (AMBULATORY_SURGERY_CENTER): Payer: 59 | Admitting: Ophthalmology

## 2021-03-07 DIAGNOSIS — H3341 Traction detachment of retina, right eye: Secondary | ICD-10-CM | POA: Diagnosis not present

## 2021-03-08 ENCOUNTER — Other Ambulatory Visit: Payer: Self-pay

## 2021-03-08 ENCOUNTER — Ambulatory Visit (INDEPENDENT_AMBULATORY_CARE_PROVIDER_SITE_OTHER): Payer: 59 | Admitting: Ophthalmology

## 2021-03-08 DIAGNOSIS — H3341 Traction detachment of retina, right eye: Secondary | ICD-10-CM

## 2021-03-08 DIAGNOSIS — Z961 Presence of intraocular lens: Secondary | ICD-10-CM | POA: Insufficient documentation

## 2021-03-08 MED ORDER — TIMOLOL MALEATE 0.5 % OP SOLN: 1.0000 [drp] | Freq: Two times a day (BID) | OPHTHALMIC | 1 refills | Status: DC

## 2021-03-08 MED ORDER — BRIMONIDINE TARTRATE 0.2 % OP SOLN: 1.0000 [drp] | Freq: Three times a day (TID) | OPHTHALMIC | 1 refills | Status: DC

## 2021-03-08 NOTE — Assessment & Plan Note (Signed)
Postop day #1, intraocular pressures well controlled.  Minor postop anterior segment corneal edema.  Retina nicely attached, good retinopexy 360.

## 2021-03-08 NOTE — Progress Notes (Signed)
03/08/2021     CHIEF COMPLAINT Patient presents for Post-op Follow-up   HISTORY OF PRESENT ILLNESS: Valerie Heath is a 70 y.o. female who presents to the clinic today for:   HPI   Postop day #1 status post combined surgical repair of retinal detachment right eye and dense cataract.  Dr. Kathlen Mody performed cataract extraction with intraocular lens placement PCIOL followed thereafter by repair retinal detachment via vitrectomy, internal drainage of subretinal fluid, endodiathermy, laser retinopexy, fluid air exchange followed by injection silicone 3810 cSt    Yesterday afternoon patient contacted me and reported having deep achy pain about the right eye and into her forehead.  Concerns were that may have developed elevated intraocular pressure from retained viscoelastic and/or repair retinal detachment with brief elevation intraocular pressure which can occur.  Patient began to use Diamox 250 mg 2 tablets every 6 hours, and completed those this morning  Patient also commenced with use of brimonidine 0.2% 1 drop to the right eye 3 times daily and Timoptic 0.5% 1 drop right eye twice daily, started last afternoon Last edited by Hurman Horn, MD on 03/08/2021  8:12 AM.      Referring physician: Ginger Organ., MD [place],  [place] [id number]  HISTORICAL INFORMATION:   Selected notes from the MEDICAL RECORD NUMBER       CURRENT MEDICATIONS: Current Outpatient Medications (Ophthalmic Drugs)  Medication Sig   brimonidine (ALPHAGAN) 0.2 % ophthalmic solution Place 1 drop into the right eye every 8 (eight) hours.   timolol (TIMOPTIC) 0.5 % ophthalmic solution Place 1 drop into the right eye 2 (two) times daily.   cyclopentolate (CYCLOGYL) 1 % ophthalmic solution Place 1 drop into the right eye 2 (two) times daily for 21 days.   ofloxacin (OCUFLOX) 0.3 % ophthalmic solution Place 1 drop into the right eye 4 (four) times daily for 21 days.   prednisoLONE acetate  (PRED FORTE) 1 % ophthalmic suspension Place 1 drop into the right eye 4 (four) times daily for 21 days.   No current facility-administered medications for this visit. (Ophthalmic Drugs)   Current Outpatient Medications (Other)  Medication Sig   anastrozole (ARIMIDEX) 1 MG tablet TAKE 1 TABLET BY MOUTH EVERY DAY   Cholecalciferol (VITAMIN D) 50 MCG (2000 UT) CAPS Take 1 capsule (2,000 Units total) by mouth daily.   clobetasol cream (TEMOVATE) 0.05 % Apply to affected area as needed   levothyroxine (SYNTHROID) 112 MCG tablet Take 1 tablet (112 mcg total) by mouth daily before breakfast.   valACYclovir (VALTREX) 500 MG tablet TAKE 4 TABLETS BY MOUTH ON START OF UCLERATION, THEN 4 TABLETS 12 HOURS LATER   No current facility-administered medications for this visit. (Other)      REVIEW OF SYSTEMS:    ALLERGIES Allergies  Allergen Reactions   Erythromycin Nausea Only    PAST MEDICAL HISTORY Past Medical History:  Diagnosis Date   Breast cancer ([facility])    Breast cancer of upper-outer quadrant of left female breast ([place])    Breast cancer of upper-outer quadrant of right female breast ([facility] [place])    Cancer ([facility])    breast cancer-lt   Fibroid    Hypothyroid    Personal history of radiation therapy    Radiation 06/16/14-08/01/14   left and right breast    Past Surgical History:  Procedure Laterality Date   BREAST BIOPSY     BREAST LUMPECTOMY Bilateral    2015   BREAST LUMPECTOMY WITH RADIOACTIVE  SEED LOCALIZATION Left 02/21/2014   Procedure: BREAST LUMPECTOMY WITH RADIOACTIVE SEED LOCALIZATION;  Surgeon: Adin Hector, MD;  Location: [facility];  Service: General;  Laterality: Left;   BREAST SURGERY Right 04/2014   Lumpectomy    BREAST SURGERY Left    COLONOSCOPY     LYMPH NODE DISSECTION  04/27/14   right and left   MYOMECTOMY     REFRACTIVE SURGERY     X 2   SHOULDER SURGERY  2005   left   TONSILLECTOMY AND ADENOIDECTOMY      FAMILY HISTORY Family History   Problem Relation Age of Onset   Hypertension Mother    Breast cancer Mother 20   Cancer Mother 72       breast cancer at 46 ; Fallopian tube tumor in her 49s   Diabetes Father    Hypertension Father    Heart disease Father    Prostate cancer Father    Cancer Father 26       prostate cancer   Colon cancer Paternal Uncle    Cancer Paternal Uncle 8       colon cancer   Throat cancer Maternal Uncle    Cancer Maternal Uncle 45       prostate cancer   Prostate cancer Maternal Uncle    Breast cancer Maternal Aunt 39   Leukemia Paternal Grandfather    Leukemia Maternal Aunt    Cancer Paternal Uncle 38       brain cancer   Brain cancer Paternal Uncle     SOCIAL HISTORY Social History   Tobacco Use   Smoking status: Never   Smokeless tobacco: Never  Vaping Use   Vaping Use: Never used  Substance Use Topics   Alcohol use: Yes    Alcohol/week: 2.0 standard drinks    Types: 2 Standard drinks or equivalent per week    Comment: Socially   Drug use: No         OPHTHALMIC EXAM:  Base Eye Exam     Visual Acuity (ETDRS)       Right Left   Dist [place] CF at 3'          Tonometry (Tonopen, 8:08 AM)       Right Left   Pressure 12          Dilation     Right eye: 1.0% Mydriacyl, 2.5% Phenylephrine @ 8:10 AM           Slit Lamp and Fundus Exam     Slit Lamp Exam       Right Left   Lids/Lashes Normal    Conjunctiva/Sclera 2+ Injection, good cons closure    Cornea 1+ Edema, Descemet's folds, trace, good nylon closure temporally    Anterior Chamber Deep and no cell no flare    Iris Round and reactive    Lens Posterior chamber intraocular lens, Centered posterior chamber intraocular lens    Anterior Vitreous Normal             IMAGING AND PROCEDURES  Imaging and Procedures for 03/08/21           ASSESSMENT/PLAN:  Traction retinal detachment involving macula, right Postop day #1, intraocular pressures well controlled.  Minor postop anterior  segment corneal edema.  Retina nicely attached, good retinopexy 360.      Pseudophakia, right eye Well centered PCIOL, media clear, mild anterior segment/corneal edema, likely cleared nicely      ICD-10-CM   1.  Traction retinal detachment involving macula, right  H33.41     2. Pseudophakia, right eye  Z96.1       1.  We will have patient return visit in 4 days to continue to monitor the anterior segment as well as the retina posteriorly  2.  Postoperative positioning reviewed.  3.  Medications reviewed.  In addition patient remains on oral Tylenol 2 extra strength tablets every 8-12 hours as needed discomfort which may be alternated and combined with ibuprofen 2 or 3 , 200 mg tablets ,every 8 hours, taken 4 hours after previous Tylenol or acetaminophen tablets above    Ophthalmic Meds Ordered this visit:  Meds ordered this encounter  Medications   brimonidine (ALPHAGAN) 0.2 % ophthalmic solution    Sig: Place 1 drop into the right eye every 8 (eight) hours.    Dispense:  5 mL    Refill:  1   timolol (TIMOPTIC) 0.5 % ophthalmic solution    Sig: Place 1 drop into the right eye 2 (two) times daily.    Dispense:  5 mL    Refill:  1       Return in about 4 days (around 03/12/2021) for dilate, OD, POST OP.  Patient Instructions  Ofloxacin  4 times daily to the operative eye  Prednisolone acetate 1 drop to the operative eye 4 times daily  Patient instructed not to refill the medications and use them for maximum of 3 weeks.  Patient instructed do not rub the eye.  Patient has the option to use the patch at night.     Patient asked to discontinue the tablets Diamox  Patient asked to continue brimonidine 0.2% 1 drop right eye 3 times daily  Patient asked to continue Timoptic 0.5% 1 drop right eye daily  POST OP RETINAL DETACHMENT REPAIR, USE OF OIL- The operative eye has oil in place.  Do not sleep, rest flat on back for long periods of time, preferably, no longer  than 10 minutes.  Sleep or rest on either side.  Travel may include to regions of elevation including mountains, or the coase.  Airline travel is permitted.   Explained the diagnoses, plan, and follow up with the patient and they expressed understanding.  Patient expressed understanding of the importance of proper follow up care.   Clent Demark Gabryella Murfin M.D. Diseases & Surgery of the Retina and Vitreous Retina & Diabetic [place] 03/08/21     Abbreviations: M myopia (nearsighted); A astigmatism; H hyperopia (farsighted); P presbyopia; Mrx spectacle prescription;  CTL contact lenses; OD right eye; OS left eye; OU both eyes  XT exotropia; ET esotropia; PEK punctate epithelial keratitis; PEE punctate epithelial erosions; DES dry eye syndrome; MGD meibomian gland dysfunction; ATs artificial tears; PFAT's preservative free artificial tears; [place] nuclear sclerotic cataract; PSC posterior subcapsular cataract; ERM epi-retinal membrane; PVD posterior vitreous detachment; RD retinal detachment; DM diabetes mellitus; DR diabetic retinopathy; NPDR non-proliferative diabetic retinopathy; PDR proliferative diabetic retinopathy; CSME clinically significant macular edema; DME diabetic macular edema; dbh dot blot hemorrhages; CWS cotton wool spot; POAG primary open angle glaucoma; C/D cup-to-disc ratio; HVF humphrey visual field; GVF goldmann visual field; OCT optical coherence tomography; IOP intraocular pressure; BRVO Branch retinal vein occlusion; CRVO central retinal vein occlusion; CRAO central retinal artery occlusion; BRAO branch retinal artery occlusion; RT retinal tear; SB scleral buckle; PPV pars plana vitrectomy; VH Vitreous hemorrhage; PRP panretinal laser photocoagulation; IVK intravitreal kenalog; VMT vitreomacular traction; MH Macular hole;  NVD neovascularization of the disc;  NVE neovascularization elsewhere; AREDS age related eye disease study; ARMD age related macular degeneration; POAG primary open angle  glaucoma; EBMD epithelial/anterior basement membrane dystrophy; ACIOL anterior chamber intraocular lens; IOL intraocular lens; PCIOL posterior chamber intraocular lens; Phaco/IOL phacoemulsification with intraocular lens placement; [place] photorefractive keratectomy; LASIK laser assisted in situ keratomileusis; HTN hypertension; DM diabetes mellitus; COPD chronic obstructive pulmonary disease

## 2021-03-08 NOTE — Patient Instructions (Addendum)
Ofloxacin  4 times daily to the operative eye  Prednisolone acetate 1 drop to the operative eye 4 times daily  Patient instructed not to refill the medications and use them for maximum of 3 weeks.  Patient instructed do not rub the eye.  Patient has the option to use the patch at night.     Patient asked to discontinue the tablets Diamox  Patient asked to continue brimonidine 0.2% 1 drop right eye 3 times daily  Patient asked to continue Timoptic 0.5% 1 drop right eye daily  POST OP RETINAL DETACHMENT REPAIR, USE OF OIL- The operative eye has oil in place.  Do not sleep, rest flat on back for long periods of time, preferably, no longer than 10 minutes.  Sleep or rest on either side.  Travel may include to regions of elevation including mountains, or the coase.  Airline travel is permitted.

## 2021-03-08 NOTE — Assessment & Plan Note (Signed)
Well centered PCIOL, media clear, mild anterior segment/corneal edema, likely cleared nicely

## 2021-03-12 ENCOUNTER — Encounter (INDEPENDENT_AMBULATORY_CARE_PROVIDER_SITE_OTHER): Payer: Self-pay | Admitting: Ophthalmology

## 2021-03-12 ENCOUNTER — Encounter (INDEPENDENT_AMBULATORY_CARE_PROVIDER_SITE_OTHER): Payer: 59 | Admitting: Ophthalmology

## 2021-03-12 ENCOUNTER — Other Ambulatory Visit: Payer: Self-pay

## 2021-03-12 ENCOUNTER — Ambulatory Visit (INDEPENDENT_AMBULATORY_CARE_PROVIDER_SITE_OTHER): Payer: 59 | Admitting: Ophthalmology

## 2021-03-12 DIAGNOSIS — H3341 Traction detachment of retina, right eye: Secondary | ICD-10-CM

## 2021-03-12 NOTE — Patient Instructions (Signed)
We will asked patient to cease t and discontinue the use of  Timoptic which is being used 1 drop once a day to the right eye  Discontinue use of brimonidine right eye

## 2021-03-12 NOTE — Assessment & Plan Note (Signed)
5 days status post repair complex traction retinal detachment, macula off via combo surgery including cataract extraction with intraocular lens placement (Dr. Quentin Ore)  Followed same day with repair retinal detachment via vitrectomy membrane peel endolaser photocoagulation's internal drainage subretinal fluid, fluid air exchange, followed injection silicone  1901 CS  Retina attached.  Brief secondary elevation of intraocular pressure has stabilized on Timoptic and brimonidine.  We will now taper

## 2021-03-12 NOTE — Progress Notes (Signed)
03/12/2021     CHIEF COMPLAINT Patient presents for Post-op Follow-up (Postop day 5 status post repair of complex retinal detachment via, surgery with cataract extraction intraocular lens placement combined with vitrectomy membrane peel internal drainage subretinal fluid laser retinopexy and injection of silicone oil 9629 CS)   HISTORY OF PRESENT ILLNESS: Valerie Heath is a 70 y.o. female who presents to the clinic today for:   HPI     Post-op Follow-up           Laterality: right eye   Discomfort: foreign body sensation   Vision: is stable   Comments: Postop day 5 status post repair of complex retinal detachment via, surgery with cataract extraction intraocular lens placement combined with vitrectomy membrane peel internal drainage subretinal fluid laser retinopexy and injection of silicone oil 5284 CS       Last edited by Hurman Horn, MD on 03/12/2021  8:11 AM.      Referring physician: Ginger Organ., MD [place],  [place] [address]  HISTORICAL INFORMATION:   Selected notes from the MEDICAL RECORD NUMBER       CURRENT MEDICATIONS: Current Outpatient Medications (Ophthalmic Drugs)  Medication Sig   cyclopentolate (CYCLOGYL) 1 % ophthalmic solution Place 1 drop into the right eye 2 (two) times daily for 21 days.   ofloxacin (OCUFLOX) 0.3 % ophthalmic solution Place 1 drop into the right eye 4 (four) times daily for 21 days.   prednisoLONE acetate (PRED FORTE) 1 % ophthalmic suspension Place 1 drop into the right eye 4 (four) times daily for 21 days.   No current facility-administered medications for this visit. (Ophthalmic Drugs)   Current Outpatient Medications (Other)  Medication Sig   anastrozole (ARIMIDEX) 1 MG tablet TAKE 1 TABLET BY MOUTH EVERY DAY   Cholecalciferol (VITAMIN D) 50 MCG (2000 UT) CAPS Take 1 capsule (2,000 Units total) by mouth daily.   clobetasol cream (TEMOVATE) 0.05 % Apply to affected area as needed   levothyroxine  (SYNTHROID) 112 MCG tablet Take 1 tablet (112 mcg total) by mouth daily before breakfast.   valACYclovir (VALTREX) 500 MG tablet TAKE 4 TABLETS BY MOUTH ON START OF UCLERATION, THEN 4 TABLETS 12 HOURS LATER   No current facility-administered medications for this visit. (Other)      REVIEW OF SYSTEMS:    ALLERGIES Allergies  Allergen Reactions   Erythromycin Nausea Only    PAST MEDICAL HISTORY Past Medical History:  Diagnosis Date   Breast cancer ([place])    Breast cancer of upper-outer quadrant of left female breast ([place])    Breast cancer of upper-outer quadrant of right female breast ([place])    Cancer ([place])    breast cancer-lt   Fibroid    Hypothyroid    Personal history of radiation therapy    Posterior vitreous detachment of 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   Radiation 06/16/14-08/01/14   left and right breast    Past Surgical History:  Procedure Laterality Date   BREAST BIOPSY     BREAST LUMPECTOMY Bilateral    2015   BREAST LUMPECTOMY WITH RADIOACTIVE SEED LOCALIZATION Left 02/21/2014   Procedure: BREAST LUMPECTOMY WITH RADIOACTIVE SEED LOCALIZATION;  Surgeon: Adin Hector, MD;  Location: [facility];  Service: General;  Laterality: Left;   BREAST SURGERY Right 04/2014   Lumpectomy    BREAST SURGERY Left    COLONOSCOPY     LYMPH NODE DISSECTION  04/27/14   right and left   MYOMECTOMY  REFRACTIVE SURGERY     X 2   SHOULDER SURGERY  2005   left   TONSILLECTOMY AND ADENOIDECTOMY      FAMILY HISTORY Family History  Problem Relation Age of Onset   Hypertension Mother    Breast cancer Mother 52   Cancer Mother 50       breast cancer at 89 ; Fallopian tube tumor in her 42s   Diabetes Father    Hypertension Father    Heart disease Father    Prostate cancer Father    Cancer Father 67       prostate cancer   Colon cancer Paternal Uncle    Cancer Paternal Uncle 6       colon cancer   Throat cancer Maternal Uncle    Cancer Maternal Uncle 45       prostate  cancer   Prostate cancer Maternal Uncle    Breast cancer Maternal Aunt 61   Leukemia Paternal Grandfather    Leukemia Maternal Aunt    Cancer Paternal Uncle 20       brain cancer   Brain cancer Paternal Uncle     SOCIAL HISTORY Social History   Tobacco Use   Smoking status: Never   Smokeless tobacco: Never  Vaping Use   Vaping Use: Never used  Substance Use Topics   Alcohol use: Yes    Alcohol/week: 2.0 standard drinks    Types: 2 Standard drinks or equivalent per week    Comment: Socially   Drug use: No         OPHTHALMIC EXAM:  Base Eye Exam     Visual Acuity (ETDRS)       Right Left   Dist [place] 20/400    Dist ph [place] 20/200          Tonometry (Tonopen, 8:12 AM)       Right Left   Pressure 14          Dilation     Right eye: 1.0% Mydriacyl, 2.5% Phenylephrine @ 8:12 AM           Slit Lamp and Fundus Exam     External Exam       Right Left   External Normal          Slit Lamp Exam       Right Left   Lids/Lashes 1+ edema    Conjunctiva/Sclera Injection 2+, will close, Vicryl    Cornea Clear    Anterior Chamber Deep and quiet    Iris Round and reactive    Lens Centered posterior chamber intraocular lens    Anterior Vitreous Clear oil             IMAGING AND PROCEDURES  Imaging and Procedures for 03/12/21           ASSESSMENT/PLAN:  Traction retinal detachment involving macula, right 5 days status post repair complex traction retinal detachment, macula off via combo surgery including cataract extraction with intraocular lens placement (Dr. Quentin Ore)  Followed same day with repair retinal detachment via vitrectomy membrane peel endolaser photocoagulation's internal drainage subretinal fluid, fluid air exchange, followed injection silicone  1194 CS  Retina attached.  Brief secondary elevation of intraocular pressure has stabilized on Timoptic and brimonidine.  We will now taper     ICD-10-CM   1. Traction retinal  detachment involving macula, right  H33.41       1.  OD retina nicely attached 5 days post complex repair retinal  detachment using combination surgery  2.  OD, intraocular pressure normalized to 14 mm on brimonidine 3 times daily and Timoptic daily.  Will now hold topical l pressure lowering drops and recheck intraocular pressure in 1 week  3.  Ophthalmic Meds Ordered this visit:  No orders of the defined types were placed in this encounter.      Return in about 1 week (around 03/19/2021) for dilate, OD, POST OP.  Patient Instructions  We will asked patient to cease t and discontinue the use of  Timoptic which is being used 1 drop once a day to the right eye  Discontinue use of brimonidine right eye     Explained the diagnoses, plan, and follow up with the patient and they expressed understanding.  Patient expressed understanding of the importance of proper follow up care.   Clent Demark Advith Martine M.D. Diseases & Surgery of the Retina and Vitreous Retina & Diabetic [place] 03/12/21     Abbreviations: M myopia (nearsighted); A astigmatism; H hyperopia (farsighted); P presbyopia; Mrx spectacle prescription;  CTL contact lenses; OD right eye; OS left eye; OU both eyes  XT exotropia; ET esotropia; PEK punctate epithelial keratitis; PEE punctate epithelial erosions; DES dry eye syndrome; MGD meibomian gland dysfunction; ATs artificial tears; PFAT's preservative free artificial tears; [place] nuclear sclerotic cataract; PSC posterior subcapsular cataract; ERM epi-retinal membrane; PVD posterior vitreous detachment; RD retinal detachment; DM diabetes mellitus; DR diabetic retinopathy; NPDR non-proliferative diabetic retinopathy; PDR proliferative diabetic retinopathy; CSME clinically significant macular edema; DME diabetic macular edema; dbh dot blot hemorrhages; CWS cotton wool spot; POAG primary open angle glaucoma; C/D cup-to-disc ratio; HVF humphrey visual field; GVF goldmann visual field;  OCT optical coherence tomography; IOP intraocular pressure; BRVO Branch retinal vein occlusion; CRVO central retinal vein occlusion; CRAO central retinal artery occlusion; BRAO branch retinal artery occlusion; RT retinal tear; SB scleral buckle; PPV pars plana vitrectomy; VH Vitreous hemorrhage; PRP panretinal laser photocoagulation; IVK intravitreal kenalog; VMT vitreomacular traction; MH Macular hole;  NVD neovascularization of the disc; NVE neovascularization elsewhere; AREDS age related eye disease study; ARMD age related macular degeneration; POAG primary open angle glaucoma; EBMD epithelial/anterior basement membrane dystrophy; ACIOL anterior chamber intraocular lens; IOL intraocular lens; PCIOL posterior chamber intraocular lens; Phaco/IOL phacoemulsification with intraocular lens placement; [place] photorefractive keratectomy; LASIK laser assisted in situ keratomileusis; HTN hypertension; DM diabetes mellitus; COPD chronic obstructive pulmonary disease

## 2021-03-20 ENCOUNTER — Ambulatory Visit (INDEPENDENT_AMBULATORY_CARE_PROVIDER_SITE_OTHER): Payer: 59 | Admitting: Ophthalmology

## 2021-03-20 ENCOUNTER — Other Ambulatory Visit: Payer: Self-pay

## 2021-03-20 ENCOUNTER — Encounter (INDEPENDENT_AMBULATORY_CARE_PROVIDER_SITE_OTHER): Payer: Self-pay | Admitting: Ophthalmology

## 2021-03-20 DIAGNOSIS — H3341 Traction detachment of retina, right eye: Secondary | ICD-10-CM

## 2021-03-20 NOTE — Assessment & Plan Note (Signed)
OD, looks great status post combined surgical intervention, via cataract extraction intraocular lens with Dr. Kathlen Mody  Followed by vitrectomy membrane peel Turner's subretinal fluid laser retinopexy and instillation of silicone oil 4037 cSt

## 2021-03-20 NOTE — Patient Instructions (Signed)
Patient instructed to continue her topical eyedrops for the right eye until the current medication bottles are complete and do not need to refill  Patient also recommended to use vitamin B complex specifically "super B complex" 1 tablet daily which has been my experience helps to prevent potential retinal nerve fiber layer nutritional loss due to silicone oil in place and prevent idiopathic vision loss with long-term silicone oil usage ,personal experience with patients

## 2021-03-20 NOTE — Progress Notes (Signed)
03/20/2021     CHIEF COMPLAINT Patient presents for Post-op Follow-up (1 Wk POV OD//Pt reports intermittent ache OD, but sts very mild. Pt sts VA improved OD. Pt sts she is currently using three different drops OD.)   HISTORY OF PRESENT ILLNESS: Valerie Heath is a 70 y.o. female who presents to the clinic today for:   HPI     Post-op Follow-up           Laterality: right eye   Vision: is improved   Comments: 1 Wk POV OD  Pt reports intermittent ache OD, but sts very mild. Pt sts VA improved OD. Pt sts she is currently using three different drops OD.       Last edited by Milly Jakob, Schneider on 03/20/2021  8:07 AM.      Referring physician: Ginger Organ., MD [place],  [place] [id number]  HISTORICAL INFORMATION:   Selected notes from the MEDICAL RECORD NUMBER       CURRENT MEDICATIONS: No current outpatient medications on file. (Ophthalmic Drugs)   No current facility-administered medications for this visit. (Ophthalmic Drugs)   Current Outpatient Medications (Other)  Medication Sig   anastrozole (ARIMIDEX) 1 MG tablet TAKE 1 TABLET BY MOUTH EVERY DAY   Cholecalciferol (VITAMIN D) 50 MCG (2000 UT) CAPS Take 1 capsule (2,000 Units total) by mouth daily.   clobetasol cream (TEMOVATE) 0.05 % Apply to affected area as needed   levothyroxine (SYNTHROID) 112 MCG tablet Take 1 tablet (112 mcg total) by mouth daily before breakfast.   valACYclovir (VALTREX) 500 MG tablet TAKE 4 TABLETS BY MOUTH ON START OF UCLERATION, THEN 4 TABLETS 12 HOURS LATER   No current facility-administered medications for this visit. (Other)      REVIEW OF SYSTEMS:    ALLERGIES Allergies  Allergen Reactions   Erythromycin Nausea Only    PAST MEDICAL HISTORY Past Medical History:  Diagnosis Date   Breast cancer ([place])    Breast cancer of upper-outer quadrant of left female breast ([place])    Breast cancer of upper-outer quadrant of right female breast ([place])    Cancer  ([place])    breast cancer-lt   Fibroid    Hypothyroid    Personal history of radiation therapy    Posterior vitreous detachment of 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   Radiation 06/16/14-08/01/14   left and right breast    Past Surgical History:  Procedure Laterality Date   BREAST BIOPSY     BREAST LUMPECTOMY Bilateral    2015   BREAST LUMPECTOMY WITH RADIOACTIVE SEED LOCALIZATION Left 02/21/2014   Procedure: BREAST LUMPECTOMY WITH RADIOACTIVE SEED LOCALIZATION;  Surgeon: Adin Hector, MD;  Location: [facility];  Service: General;  Laterality: Left;   BREAST SURGERY Right 04/2014   Lumpectomy    BREAST SURGERY Left    COLONOSCOPY     LYMPH NODE DISSECTION  04/27/14   right and left   MYOMECTOMY     REFRACTIVE SURGERY     X 2   SHOULDER SURGERY  2005   left   TONSILLECTOMY AND ADENOIDECTOMY      FAMILY HISTORY Family History  Problem Relation Age of Onset   Hypertension Mother    Breast cancer Mother 77   Cancer Mother 34       breast cancer at 27 ; Fallopian tube tumor in her 19s   Diabetes Father    Hypertension Father    Heart disease Father  Prostate cancer Father    Cancer Father 68       prostate cancer   Colon cancer Paternal Uncle    Cancer Paternal Uncle 43       colon cancer   Throat cancer Maternal Uncle    Cancer Maternal Uncle 45       prostate cancer   Prostate cancer Maternal Uncle    Breast cancer Maternal Aunt 17   Leukemia Paternal Grandfather    Leukemia Maternal Aunt    Cancer Paternal Uncle 71       brain cancer   Brain cancer Paternal Uncle     SOCIAL HISTORY Social History   Tobacco Use   Smoking status: Never   Smokeless tobacco: Never  Vaping Use   Vaping Use: Never used  Substance Use Topics   Alcohol use: Yes    Alcohol/week: 2.0 standard drinks    Types: 2 Standard drinks or equivalent per week    Comment: Socially   Drug use: No         OPHTHALMIC EXAM:  Base Eye Exam     Visual Acuity (ETDRS)       Right Left    Dist [place] 20/400 20/20 -1   Dist ph [place] NI          Tonometry (Tonopen, 8:10 AM)       Right Left   Pressure 14 14         Pupils       Dark Light Shape React APD   Right 5 5 Round Minimal None   Left 4 3 Round Brisk None         Visual Fields     Counting fingers         Extraocular Movement       Right Left    Full Full         Neuro/Psych     Oriented x3: Yes   Mood/Affect: Normal         Dilation     Right eye: 1.0% Mydriacyl, 2.5% Phenylephrine @ 8:10 AM           Slit Lamp and Fundus Exam     External Exam       Right Left   External Normal          Slit Lamp Exam       Right Left   Lids/Lashes 1+ edema    Conjunctiva/Sclera Injection 2+, will close, Vicryl    Cornea Clear    Anterior Chamber Deep and quiet    Iris Round and reactive    Lens Centered posterior chamber intraocular lens    Anterior Vitreous Clear oil          Fundus Exam       Right Left   Posterior Vitreous Clear silicone oil    Disc Normal    C/D Ratio 0.2    Macula Macula attached    Vessels Normal    Periphery Retinopexy surrounding retinotomy inferior,  temporally             IMAGING AND PROCEDURES  Imaging and Procedures for 03/20/21  OCT, Retina - OU - Both Eyes       Right Eye Quality was good. Scan locations included subfoveal. Central Foveal Thickness: 581. Progression has worsened.   Left Eye Quality was good. Scan locations included subfoveal. Progression has been stable.   Notes  Retina attached  ASSESSMENT/PLAN:  Traction retinal detachment involving macula, right OD, looks great status post combined surgical intervention, via cataract extraction intraocular lens with Dr. Kathlen Mody  Followed by vitrectomy membrane peel Turner's subretinal fluid laser retinopexy and instillation of silicone oil 7628 cSt     ICD-10-CM   1. Traction retinal detachment involving macula, right  H33.41 OCT, Retina - OU -  Both Eyes      1.  Right eye looks great retina attached, silicone oil clear.  Positioning reviewed with the patient  2.  3.  Ophthalmic Meds Ordered this visit:  No orders of the defined types were placed in this encounter.      Return in about 2 weeks (around 04/03/2021) for OD, dilate, POST OP, COLOR FP, OCT.  Patient Instructions  Patient instructed to continue her topical eyedrops for the right eye until the current medication bottles are complete and do not need to refill  Patient also recommended to use vitamin B complex specifically "super B complex" 1 tablet daily which has been my experience helps to prevent potential retinal nerve fiber layer nutritional loss due to silicone oil in place and prevent idiopathic vision loss with long-term silicone oil usage ,personal experience with patients   Explained the diagnoses, plan, and follow up with the patient and they expressed understanding.  Patient expressed understanding of the importance of proper follow up care.   Clent Demark Manny Vitolo M.D. Diseases & Surgery of the Retina and Vitreous Retina & Diabetic [place] 03/20/21     Abbreviations: M myopia (nearsighted); A astigmatism; H hyperopia (farsighted); P presbyopia; Mrx spectacle prescription;  CTL contact lenses; OD right eye; OS left eye; OU both eyes  XT exotropia; ET esotropia; PEK punctate epithelial keratitis; PEE punctate epithelial erosions; DES dry eye syndrome; MGD meibomian gland dysfunction; ATs artificial tears; PFAT's preservative free artificial tears; [place] nuclear sclerotic cataract; PSC posterior subcapsular cataract; ERM epi-retinal membrane; PVD posterior vitreous detachment; RD retinal detachment; DM diabetes mellitus; DR diabetic retinopathy; NPDR non-proliferative diabetic retinopathy; PDR proliferative diabetic retinopathy; CSME clinically significant macular edema; DME diabetic macular edema; dbh dot blot hemorrhages; CWS cotton wool spot; POAG primary  open angle glaucoma; C/D cup-to-disc ratio; HVF humphrey visual field; GVF goldmann visual field; OCT optical coherence tomography; IOP intraocular pressure; BRVO Branch retinal vein occlusion; CRVO central retinal vein occlusion; CRAO central retinal artery occlusion; BRAO branch retinal artery occlusion; RT retinal tear; SB scleral buckle; PPV pars plana vitrectomy; VH Vitreous hemorrhage; PRP panretinal laser photocoagulation; IVK intravitreal kenalog; VMT vitreomacular traction; MH Macular hole;  NVD neovascularization of the disc; NVE neovascularization elsewhere; AREDS age related eye disease study; ARMD age related macular degeneration; POAG primary open angle glaucoma; EBMD epithelial/anterior basement membrane dystrophy; ACIOL anterior chamber intraocular lens; IOL intraocular lens; PCIOL posterior chamber intraocular lens; Phaco/IOL phacoemulsification with intraocular lens placement; [place] photorefractive keratectomy; LASIK laser assisted in situ keratomileusis; HTN hypertension; DM diabetes mellitus; COPD chronic obstructive pulmonary disease

## 2021-04-03 ENCOUNTER — Encounter (INDEPENDENT_AMBULATORY_CARE_PROVIDER_SITE_OTHER): Payer: Self-pay | Admitting: Ophthalmology

## 2021-04-03 ENCOUNTER — Other Ambulatory Visit: Payer: Self-pay

## 2021-04-03 ENCOUNTER — Ambulatory Visit (INDEPENDENT_AMBULATORY_CARE_PROVIDER_SITE_OTHER): Payer: 59 | Admitting: Ophthalmology

## 2021-04-03 DIAGNOSIS — H3341 Traction detachment of retina, right eye: Secondary | ICD-10-CM | POA: Diagnosis not present

## 2021-04-03 NOTE — Progress Notes (Signed)
04/03/2021     CHIEF COMPLAINT Patient presents for Post-op Follow-up (2 week post op od OCT/FP sx on 03/07/2021/Pt states, "I am concerned about my lid still being so swollen and my eye not being able to open entirely. I also need to know if I can have a refill of the red top drop that I use at night for discomfort. I cannot tell if my vision is any better looking through this oil but it is not any worse."/I have been out of drops for about a week. )   HISTORY OF PRESENT ILLNESS: Valerie Heath is a 70 y.o. female who presents to the clinic today for:   HPI     Post-op Follow-up           Laterality: right eye   Discomfort: pain (I have occasional achiness), itching, foreign body sensation (Constant) and tearing (My eye is running constantly)   Vision: is stable   Comments: 2 week post op od OCT/FP sx on 03/07/2021 Pt states, "I am concerned about my lid still being so swollen and my eye not being able to open entirely. I also need to know if I can have a refill of the red top drop that I use at night for discomfort. I cannot tell if my vision is any better looking through this oil but it is not any worse." I have been out of drops for about a week.        Last edited by Kendra Opitz, COA on 04/03/2021  8:26 AM.      Referring physician: Ginger Organ., MD [place],   [id number]  HISTORICAL INFORMATION:   Selected notes from the MEDICAL RECORD NUMBER       CURRENT MEDICATIONS: No current outpatient medications on file. (Ophthalmic Drugs)   No current facility-administered medications for this visit. (Ophthalmic Drugs)   Current Outpatient Medications (Other)  Medication Sig   anastrozole (ARIMIDEX) 1 MG tablet TAKE 1 TABLET BY MOUTH EVERY DAY   Cholecalciferol (VITAMIN D) 50 MCG (2000 UT) CAPS Take 1 capsule (2,000 Units total) by mouth daily.   clobetasol cream (TEMOVATE) 0.05 % Apply to affected area as needed   levothyroxine (SYNTHROID)  112 MCG tablet Take 1 tablet (112 mcg total) by mouth daily before breakfast.   valACYclovir (VALTREX) 500 MG tablet TAKE 4 TABLETS BY MOUTH ON START OF UCLERATION, THEN 4 TABLETS 12 HOURS LATER   No current facility-administered medications for this visit. (Other)      REVIEW OF SYSTEMS:    ALLERGIES Allergies  Allergen Reactions   Erythromycin Nausea Only    PAST MEDICAL HISTORY Past Medical History:  Diagnosis Date   Breast cancer ([place])    Breast cancer of upper-outer quadrant of left female breast ([place])    Breast cancer of upper-outer quadrant of right female breast ([place])    Cancer ([place])    breast cancer-lt   Fibroid    Hypothyroid    Personal history of radiation therapy    Posterior vitreous detachment of 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   Radiation 06/16/14-08/01/14   left and right breast    Past Surgical History:  Procedure Laterality Date   BREAST BIOPSY     BREAST LUMPECTOMY Bilateral    2015   BREAST LUMPECTOMY WITH RADIOACTIVE SEED LOCALIZATION Left 02/21/2014   Procedure: BREAST LUMPECTOMY WITH RADIOACTIVE SEED LOCALIZATION;  Surgeon: Adin Hector, MD;  Location: [facility];  Service: General;  Laterality: Left;   BREAST SURGERY Right 04/2014   Lumpectomy    BREAST SURGERY Left    COLONOSCOPY     LYMPH NODE DISSECTION  04/27/14   right and left   MYOMECTOMY     REFRACTIVE SURGERY     X 2   SHOULDER SURGERY  2005   left   TONSILLECTOMY AND ADENOIDECTOMY      FAMILY HISTORY Family History  Problem Relation Age of Onset   Hypertension Mother    Breast cancer Mother 31   Cancer Mother 39       breast cancer at 51 ; Fallopian tube tumor in her 72s   Diabetes Father    Hypertension Father    Heart disease Father    Prostate cancer Father    Cancer Father 66       prostate cancer   Colon cancer Paternal Uncle    Cancer Paternal Uncle 76       colon cancer   Throat cancer Maternal Uncle    Cancer Maternal Uncle 45       prostate cancer    Prostate cancer Maternal Uncle    Breast cancer Maternal Aunt 67   Leukemia Paternal Grandfather    Leukemia Maternal Aunt    Cancer Paternal Uncle 26       brain cancer   Brain cancer Paternal Uncle     SOCIAL HISTORY Social History   Tobacco Use   Smoking status: Never   Smokeless tobacco: Never  Vaping Use   Vaping Use: Never used  Substance Use Topics   Alcohol use: Yes    Alcohol/week: 2.0 standard drinks    Types: 2 Standard drinks or equivalent per week    Comment: Socially   Drug use: No         OPHTHALMIC EXAM:  Base Eye Exam     Visual Acuity (ETDRS)       Right Left   Dist [place] 20/400 20/20   Dist ph [place] NI          Tonometry (Tonopen, 8:29 AM)       Right Left   Pressure 13 13         Pupils       Pupils Dark Light Shape React APD   Right PERRL 5 5 Round Minimal None   Left PERRL 4 3 Round Brisk None         Extraocular Movement       Right Left    Full Full         Neuro/Psych     Oriented x3: Yes   Mood/Affect: Normal         Dilation     Right eye: 1.0% Mydriacyl, 2.5% Phenylephrine @ 8:29 AM           Slit Lamp and Fundus Exam     External Exam       Right Left   External Normal          Slit Lamp Exam       Right Left   Lids/Lashes 1+ edema    Conjunctiva/Sclera Injection 2+, well closed, no exposed Vicryl    Cornea Clear    Anterior Chamber Deep and quiet    Iris Round and reactive    Lens Centered posterior chamber intraocular lens    Anterior Vitreous Clear oil          Fundus Exam       Right Left  Posterior Vitreous Clear silicone oil    Disc Normal    C/D Ratio 0.2    Macula Macula attached    Vessels Normal    Periphery Retinopexy surrounding retinotomy inferior,  temporally, Scleral buckle, Attached             IMAGING AND PROCEDURES  Imaging and Procedures for 04/03/21  OCT, Retina - OU - Both Eyes       Right Eye Quality was good. Scan locations included  subfoveal. Central Foveal Thickness: 581. Progression has worsened. Findings include epiretinal membrane.   Left Eye Quality was good. Scan locations included subfoveal. Progression has been stable.   Notes  Retina attached     Color Fundus Photography Optos - OU - Both Eyes       Right Eye Disc findings include normal observations. Macula : epiretinal membrane.   Left Eye Progression has been stable. Disc findings include normal observations. Macula : normal observations. Vessels : normal observations. Periphery : normal observations.   Notes Clear oral, retina attached.  Good retinopexy OD, observe             ASSESSMENT/PLAN:  Traction retinal detachment involving macula, right Now 1 month post repair retinal detachment via vitrectomy membrane peel and injection silicone oil retina nicely attached      ICD-10-CM   1. Traction retinal detachment involving macula, right  H33.41 OCT, Retina - OU - Both Eyes    Color Fundus Photography Optos - OU - Both Eyes      1.  OD, retina attached, behind the silicone oil for silicone is clear.  There is a minor epiretinal membrane over the macular region at this point.  We will continue to monitor.  2.  Patient encouraged to use topical artificial tears mostly preservative-free and then periodic preserved tears would be okay if she is out traveling or about  3.  Patient is free to return to all full activity.  Likely cervical I will be removed at 87-month interval postsurgical intervention  Ophthalmic Meds Ordered this visit:  No orders of the defined types were placed in this encounter.      Return in about 4 weeks (around 05/01/2021) for OD, dilate.  There are no Patient Instructions on file for this visit.   Explained the diagnoses, plan, and follow up with the patient and they expressed understanding.  Patient expressed understanding of the importance of proper follow up care.   Clent Demark Kenya Shiraishi M.D. Diseases &  Surgery of the Retina and Vitreous Retina & Diabetic [place] 04/03/21     Abbreviations: M myopia (nearsighted); A astigmatism; H hyperopia (farsighted); P presbyopia; Mrx spectacle prescription;  CTL contact lenses; OD right eye; OS left eye; OU both eyes  XT exotropia; ET esotropia; PEK punctate epithelial keratitis; PEE punctate epithelial erosions; DES dry eye syndrome; MGD meibomian gland dysfunction; ATs artificial tears; PFAT's preservative free artificial tears; [place] nuclear sclerotic cataract; PSC posterior subcapsular cataract; ERM epi-retinal membrane; PVD posterior vitreous detachment; RD retinal detachment; DM diabetes mellitus; DR diabetic retinopathy; NPDR non-proliferative diabetic retinopathy; PDR proliferative diabetic retinopathy; CSME clinically significant macular edema; DME diabetic macular edema; dbh dot blot hemorrhages; CWS cotton wool spot; POAG primary open angle glaucoma; C/D cup-to-disc ratio; HVF humphrey visual field; GVF goldmann visual field; OCT optical coherence tomography; IOP intraocular pressure; BRVO Branch retinal vein occlusion; CRVO central retinal vein occlusion; CRAO central retinal artery occlusion; BRAO branch retinal artery occlusion; RT retinal tear; SB scleral buckle; PPV pars  plana vitrectomy; VH Vitreous hemorrhage; PRP panretinal laser photocoagulation; IVK intravitreal kenalog; VMT vitreomacular traction; MH Macular hole;  NVD neovascularization of the disc; NVE neovascularization elsewhere; AREDS age related eye disease study; ARMD age related macular degeneration; POAG primary open angle glaucoma; EBMD epithelial/anterior basement membrane dystrophy; ACIOL anterior chamber intraocular lens; IOL intraocular lens; PCIOL posterior chamber intraocular lens; Phaco/IOL phacoemulsification with intraocular lens placement; [place] photorefractive keratectomy; LASIK laser assisted in situ keratomileusis; HTN hypertension; DM diabetes mellitus; COPD chronic  obstructive pulmonary disease

## 2021-04-03 NOTE — Assessment & Plan Note (Signed)
Now 1 month post repair retinal detachment via vitrectomy membrane peel and injection silicone oil retina nicely attached

## 2021-05-03 ENCOUNTER — Encounter (INDEPENDENT_AMBULATORY_CARE_PROVIDER_SITE_OTHER): Payer: 59 | Admitting: Ophthalmology

## 2021-05-08 ENCOUNTER — Encounter (INDEPENDENT_AMBULATORY_CARE_PROVIDER_SITE_OTHER): Payer: Self-pay | Admitting: Ophthalmology

## 2021-05-08 ENCOUNTER — Ambulatory Visit (INDEPENDENT_AMBULATORY_CARE_PROVIDER_SITE_OTHER): Payer: 59 | Admitting: Ophthalmology

## 2021-05-08 ENCOUNTER — Other Ambulatory Visit: Payer: Self-pay

## 2021-05-08 DIAGNOSIS — H3341 Traction detachment of retina, right eye: Secondary | ICD-10-CM

## 2021-05-08 DIAGNOSIS — H35371 Puckering of macula, right eye: Secondary | ICD-10-CM | POA: Diagnosis not present

## 2021-05-08 NOTE — Assessment & Plan Note (Signed)
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  Combo surgery with Dr. Kathlen Mody at that time.

## 2021-05-08 NOTE — Assessment & Plan Note (Signed)
Macular topographic distortion in the fovea with extension along the inferotemporal arcade beneath the oil.  Retina is a attached.  I shared with the patient that this will likely need to be addressed at the time of surgical intervention to remove the silicone oil by  releasing and removal of the epiretinal membrane.

## 2021-05-08 NOTE — Progress Notes (Signed)
05/08/2021     CHIEF COMPLAINT Patient presents for Post-op Follow-up (2 week post op od OCT/FP sx on 03/07/2021/Pt states, "I am concerned about my lid still being so swollen and my eye not being able to open entirely. I also need to know if I can have a refill of the red top drop that I use at night for discomfort. I cannot tell if my vision is any better looking through this oil but it is not any worse."/I have been out of drops for about a week. )   HISTORY OF PRESENT ILLNESS: Valerie Heath is a 70 y.o. female who presents to the clinic today for:   HPI     Post-op Follow-up           Laterality: right eye   Discomfort: pain (Aches occasionally, not as often, 2/10 pain scale.) and itching.  Negative for foreign body sensation (Constant) and tearing (My eye is running constantly)   Vision: is improved   Comments: 2 week post op od OCT/FP sx on 03/07/2021 Pt states, "I am concerned about my lid still being so swollen and my eye not being able to open entirely. I also need to know if I can have a refill of the red top drop that I use at night for discomfort. I cannot tell if my vision is any better looking through this oil but it is not any worse." I have been out of drops for about a week.          Comments   5 wk PO OD FP sx 03/07/21 Pt states "I think my vision has improved, I can see further away." Pt. States she has a floater in her right eye "it kind of looks like a pool of water, it comes and goes, notice it in central vision." Pt. stopped PO prescription eye drops.        Last edited by Laurin Coder, COA on 05/08/2021  8:53 AM.      Referring physician: Ginger Organ., MD [place],  [place] [id number]  HISTORICAL INFORMATION:   Selected notes from the MEDICAL RECORD NUMBER       CURRENT MEDICATIONS: No current outpatient medications on file. (Ophthalmic Drugs)   No current facility-administered medications for this visit. (Ophthalmic  Drugs)   Current Outpatient Medications (Other)  Medication Sig   anastrozole (ARIMIDEX) 1 MG tablet TAKE 1 TABLET BY MOUTH EVERY DAY   Cholecalciferol (VITAMIN D) 50 MCG (2000 UT) CAPS Take 1 capsule (2,000 Units total) by mouth daily.   clobetasol cream (TEMOVATE) 0.05 % Apply to affected area as needed   levothyroxine (SYNTHROID) 112 MCG tablet Take 1 tablet (112 mcg total) by mouth daily before breakfast.   valACYclovir (VALTREX) 500 MG tablet TAKE 4 TABLETS BY MOUTH ON START OF UCLERATION, THEN 4 TABLETS 12 HOURS LATER   No current facility-administered medications for this visit. (Other)      REVIEW OF SYSTEMS:    ALLERGIES Allergies  Allergen Reactions   Erythromycin Nausea Only    PAST MEDICAL HISTORY Past Medical History:  Diagnosis Date   Breast cancer ([place])    Breast cancer of upper-outer quadrant of left female breast ([place])    Breast cancer of upper-outer quadrant of right female breast ([place])    Cancer ([place])    breast cancer-lt   Fibroid    Hypothyroid    Personal history of radiation therapy    Posterior vitreous detachment  of 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   Radiation 06/16/14-08/01/14   left and right breast    Past Surgical History:  Procedure Laterality Date   BREAST BIOPSY     BREAST LUMPECTOMY Bilateral    2015   BREAST LUMPECTOMY WITH RADIOACTIVE SEED LOCALIZATION Left 02/21/2014   Procedure: BREAST LUMPECTOMY WITH RADIOACTIVE SEED LOCALIZATION;  Surgeon: Adin Hector, MD;  Location: [facility];  Service: General;  Laterality: Left;   BREAST SURGERY Right 04/2014   Lumpectomy    BREAST SURGERY Left    COLONOSCOPY     LYMPH NODE DISSECTION  04/27/14   right and left   MYOMECTOMY     REFRACTIVE SURGERY     X 2   SHOULDER SURGERY  2005   left   TONSILLECTOMY AND ADENOIDECTOMY      FAMILY HISTORY Family History  Problem Relation Age of Onset   Hypertension Mother    Breast cancer Mother 86   Cancer Mother 67       breast cancer at 81  ; Fallopian tube tumor in her 12s   Diabetes Father    Hypertension Father    Heart disease Father    Prostate cancer Father    Cancer Father 3       prostate cancer   Colon cancer Paternal Uncle    Cancer Paternal Uncle 34       colon cancer   Throat cancer Maternal Uncle    Cancer Maternal Uncle 45       prostate cancer   Prostate cancer Maternal Uncle    Breast cancer Maternal Aunt 66   Leukemia Paternal Grandfather    Leukemia Maternal Aunt    Cancer Paternal Uncle 24       brain cancer   Brain cancer Paternal Uncle     SOCIAL HISTORY Social History   Tobacco Use   Smoking status: Never   Smokeless tobacco: Never  Vaping Use   Vaping Use: Never used  Substance Use Topics   Alcohol use: Yes    Alcohol/week: 2.0 standard drinks    Types: 2 Standard drinks or equivalent per week    Comment: Socially   Drug use: No         OPHTHALMIC EXAM:  Base Eye Exam     Visual Acuity (ETDRS)       Right Left   Dist [place] CF 4' 20/20 -2   Dist ph [place] NI          Tonometry (Tonopen, 9:00 AM)       Right Left   Pressure 12 14         Pupils       Pupils Dark Light Shape React APD   Right PERRL 4 3 Round Minimal None   Left PERRL 4 3 Round Brisk None         Visual Fields (Counting fingers)       Left Right    Full    Restrictions  Partial outer inferior temporal, inferior nasal deficiencies         Extraocular Movement       Right Left    Full Full         Neuro/Psych     Oriented x3: Yes   Mood/Affect: Normal         Dilation     Right eye: 1.0% Mydriacyl, 2.5% Phenylephrine @ 9:00 AM           Slit Lamp and  Fundus Exam     External Exam       Right Left   External Normal          Slit Lamp Exam       Right Left   Lids/Lashes 1+ edema    Conjunctiva/Sclera Injection 2+, well closed, no exposed Vicryl    Cornea Clear    Anterior Chamber Deep and quiet    Iris Round and reactive    Lens Centered posterior chamber  intraocular lens    Anterior Vitreous Clear oil          Fundus Exam       Right Left   Posterior Vitreous Clear silicone oil    Disc Normal    C/D Ratio 0.2    Macula Macula attached, Epiretinal membrane,     Vessels Normal    Periphery Retinopexy surrounding retinotomy inferior,  temporally, Scleral buckle, Attached             IMAGING AND PROCEDURES  Imaging and Procedures for 05/08/21  Color Fundus Photography Optos - OU - Both Eyes       Right Eye Disc findings include normal observations. Macula : epiretinal membrane.   Left Eye Progression has been stable. Disc findings include normal observations. Macula : normal observations. Vessels : normal observations. Periphery : normal observations.   Notes Clear oil retina attached.  Good retinopexy OD, observe  Epiretinal membrane with topographic distortion has progressed along the macula on the inferotemporal arcade extending to the temporal periphery centered at the 8 to 9:30 position.  We will continue to observe             ASSESSMENT/PLAN:  Epiretinal membrane, right eye Macular topographic distortion in the fovea with extension along the inferotemporal arcade beneath the oil.  Retina is a attached.  I shared with the patient that this will likely need to be addressed at the time of surgical intervention to remove the silicone oil by  releasing and removal of the epiretinal membrane.  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  Combo surgery with Dr. Kathlen Mody at that time.     ICD-10-CM   1. Epiretinal membrane, right eye  H35.371     2. Traction retinal detachment involving macula, right  H33.41 Color Fundus Photography Optos - OU - Both Eyes      1.  Minor epiretinal membrane noted on last visit now has extensions into the temporal periphery and will  likely need resection at the time of via vitrectomy silicone oil removal right eye.  2.  Follow-up in 3 weeks to make our plans for surgical intervention.  3.  Ophthalmic Meds Ordered this visit:  No orders of the defined types were placed in this encounter.      Return in about 3 weeks (around 05/29/2021) for dilate, OD, OCT, COLOR FP.  There are no Patient Instructions on file for this visit.   Explained the diagnoses, plan, and follow up with the patient and they expressed understanding.  Patient expressed understanding of the importance of proper follow up care.   Clent Demark Naijah Lacek M.D. Diseases & Surgery of the Retina and Vitreous Retina & Diabetic [place] 05/08/21     Abbreviations: M myopia (nearsighted); A astigmatism; H hyperopia (farsighted); P presbyopia; Mrx spectacle prescription;  CTL contact lenses; OD right eye; OS left eye; OU both eyes  XT exotropia;  ET esotropia; PEK punctate epithelial keratitis; PEE punctate epithelial erosions; DES dry eye syndrome; MGD meibomian gland dysfunction; ATs artificial tears; PFAT's preservative free artificial tears; [place] nuclear sclerotic cataract; PSC posterior subcapsular cataract; ERM epi-retinal membrane; PVD posterior vitreous detachment; RD retinal detachment; DM diabetes mellitus; DR diabetic retinopathy; NPDR non-proliferative diabetic retinopathy; PDR proliferative diabetic retinopathy; CSME clinically significant macular edema; DME diabetic macular edema; dbh dot blot hemorrhages; CWS cotton wool spot; POAG primary open angle glaucoma; C/D cup-to-disc ratio; HVF humphrey visual field; GVF goldmann visual field; OCT optical coherence tomography; IOP intraocular pressure; BRVO Branch retinal vein occlusion; CRVO central retinal vein occlusion; CRAO central retinal artery occlusion; BRAO branch retinal artery occlusion; RT retinal tear; SB scleral buckle; PPV pars plana vitrectomy; VH Vitreous hemorrhage; PRP panretinal laser  photocoagulation; IVK intravitreal kenalog; VMT vitreomacular traction; MH Macular hole;  NVD neovascularization of the disc; NVE neovascularization elsewhere; AREDS age related eye disease study; ARMD age related macular degeneration; POAG primary open angle glaucoma; EBMD epithelial/anterior basement membrane dystrophy; ACIOL anterior chamber intraocular lens; IOL intraocular lens; PCIOL posterior chamber intraocular lens; Phaco/IOL phacoemulsification with intraocular lens placement; [place] photorefractive keratectomy; LASIK laser assisted in situ keratomileusis; HTN hypertension; DM diabetes mellitus; COPD chronic obstructive pulmonary disease

## 2021-05-30 ENCOUNTER — Encounter (INDEPENDENT_AMBULATORY_CARE_PROVIDER_SITE_OTHER): Payer: Self-pay | Admitting: Ophthalmology

## 2021-05-30 ENCOUNTER — Ambulatory Visit (INDEPENDENT_AMBULATORY_CARE_PROVIDER_SITE_OTHER): Payer: 59 | Admitting: Ophthalmology

## 2021-05-30 ENCOUNTER — Other Ambulatory Visit: Payer: Self-pay

## 2021-05-30 DIAGNOSIS — H35371 Puckering of macula, right eye: Secondary | ICD-10-CM | POA: Diagnosis not present

## 2021-05-30 DIAGNOSIS — H3341 Traction detachment of retina, right eye: Secondary | ICD-10-CM

## 2021-05-30 MED ORDER — TOBRAMYCIN 0.3 % OP SOLN: 1.0000 [drp] | Freq: Four times a day (QID) | OPHTHALMIC | 0 refills | Status: AC

## 2021-05-30 MED ORDER — PREDNISOLONE ACETATE 1 % OP SUSP: 1.0000 [drp] | Freq: Four times a day (QID) | OPHTHALMIC | 0 refills | Status: AC

## 2021-05-30 NOTE — Progress Notes (Signed)
05/30/2021     CHIEF COMPLAINT Patient presents for  Chief Complaint  Patient presents with   Post-op Follow-up    2 week post op od OCT/FP sx on 03/07/2021 Pt states, "I am concerned about my lid still being so swollen and my eye not being able to open entirely. I also need to know if I can have a refill of the red top drop that I use at night for discomfort. I cannot tell if my vision is any better looking through this oil but it is not any worse." I have been out of drops for about a week.       HISTORY OF PRESENT ILLNESS: Valerie Heath is a 70 y.o. female who presents to the clinic today for:   HPI     Post-op Follow-up   In right eye.  Discomfort includes itching.  Negative for pain (Aches occasionally, not as often, 2/10 pain scale.), foreign body sensation (Constant) and tearing (My eye is running constantly).  Vision is improved. Additional comments: 2 week post op od OCT/FP sx on 03/07/2021 Pt states, "I am concerned about my lid still being so swollen and my eye not being able to open entirely. I also need to know if I can have a refill of the red top drop that I use at night for discomfort. I cannot tell if my vision is any better looking through this oil but it is not any worse." I have been out of drops for about a week.         Comments   3 week po od oct fp sx 03/07/2021 Patient states vision is stable and unchanged since last visit. Denies any new floaters or FOL.       Last edited by Laurin Coder on 05/30/2021  8:09 AM.      Referring physician: Ginger Organ., MD [place],  [place] [address]  HISTORICAL INFORMATION:   Selected notes from the MEDICAL RECORD NUMBER       CURRENT MEDICATIONS: No current outpatient medications on file. (Ophthalmic Drugs)   No current facility-administered medications for this visit. (Ophthalmic Drugs)   Current Outpatient Medications (Other)  Medication Sig   anastrozole (ARIMIDEX) 1 MG tablet  TAKE 1 TABLET BY MOUTH EVERY DAY   Cholecalciferol (VITAMIN D) 50 MCG (2000 UT) CAPS Take 1 capsule (2,000 Units total) by mouth daily.   clobetasol cream (TEMOVATE) 0.05 % Apply to affected area as needed   levothyroxine (SYNTHROID) 112 MCG tablet Take 1 tablet (112 mcg total) by mouth daily before breakfast.   valACYclovir (VALTREX) 500 MG tablet TAKE 4 TABLETS BY MOUTH ON START OF UCLERATION, THEN 4 TABLETS 12 HOURS LATER   No current facility-administered medications for this visit. (Other)      REVIEW OF SYSTEMS:    ALLERGIES Allergies  Allergen Reactions   Erythromycin Nausea Only    PAST MEDICAL HISTORY Past Medical History:  Diagnosis Date   Breast cancer ([place])    Breast cancer of upper-outer quadrant of left female breast ([place])    Breast cancer of upper-outer quadrant of right female breast ([place])    Cancer ([place])    breast cancer-lt   Fibroid    Hypothyroid    Personal history of radiation therapy    Posterior vitreous detachment of 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   Radiation 06/16/14-08/01/14   left and right breast    Past Surgical History:  Procedure Laterality Date   BREAST  BIOPSY     BREAST LUMPECTOMY Bilateral    2015   BREAST LUMPECTOMY WITH RADIOACTIVE SEED LOCALIZATION Left 02/21/2014   Procedure: BREAST LUMPECTOMY WITH RADIOACTIVE SEED LOCALIZATION;  Surgeon: Adin Hector, MD;  Location: [facility];  Service: General;  Laterality: Left;   BREAST SURGERY Right 04/2014   Lumpectomy    BREAST SURGERY Left    COLONOSCOPY     LYMPH NODE DISSECTION  04/27/14   right and left   MYOMECTOMY     REFRACTIVE SURGERY     X 2   SHOULDER SURGERY  2005   left   TONSILLECTOMY AND ADENOIDECTOMY      FAMILY HISTORY Family History  Problem Relation Age of Onset   Hypertension Mother    Breast cancer Mother 73   Cancer Mother 15       breast cancer at 6 ; Fallopian tube tumor in her 30s   Diabetes Father    Hypertension Father    Heart disease Father     Prostate cancer Father    Cancer Father 21       prostate cancer   Colon cancer Paternal Uncle    Cancer Paternal Uncle 74       colon cancer   Throat cancer Maternal Uncle    Cancer Maternal Uncle 45       prostate cancer   Prostate cancer Maternal Uncle    Breast cancer Maternal Aunt 45   Leukemia Paternal Grandfather    Leukemia Maternal Aunt    Cancer Paternal Uncle 74       brain cancer   Brain cancer Paternal Uncle     SOCIAL HISTORY Social History   Tobacco Use   Smoking status: Never   Smokeless tobacco: Never  Vaping Use   Vaping Use: Never used  Substance Use Topics   Alcohol use: Yes    Alcohol/week: 2.0 standard drinks    Types: 2 Standard drinks or equivalent per week    Comment: Socially   Drug use: No         OPHTHALMIC EXAM:  Base Eye Exam     Visual Acuity (Snellen - Linear)       Right Left   Dist [place] CF at 4' 20/20 -2   Dist ph [place] NI          Tonometry (Tonopen, 8:07 AM)       Right Left   Pressure 12 11         Pupils       Pupils Dark Light APD   Right PERRL 4 3 None   Left PERRL 4 3 None         Extraocular Movement       Right Left    Full Full         Neuro/Psych     Oriented x3: Yes   Mood/Affect: Normal         Dilation     Right eye: 1.0% Mydriacyl, 2.5% Phenylephrine @ 8:07 AM           Slit Lamp and Fundus Exam     External Exam       Right Left   External Normal          Slit Lamp Exam       Right Left   Lids/Lashes 1+ edema    Conjunctiva/Sclera Injection 2+, well closed, no exposed Vicryl    Cornea Clear    Anterior Chamber  Deep and quiet    Iris Round and reactive    Lens Centered posterior chamber intraocular lens    Anterior Vitreous Clear oil          Fundus Exam       Right Left   Posterior Vitreous Clear silicone oil    Disc Normal    C/D Ratio 0.2    Macula Macula attached, Epiretinal membrane,  Severe topographic distortion from erm connected to peripheral  traction rd, no srf.    Vessels Normal    Periphery Retinopexy surrounding retinotomy inferior,  temporally, Scleral buckle, Attached             IMAGING AND PROCEDURES  Imaging and Procedures for 05/30/21  OCT, Retina - OU - Both Eyes       Right Eye Quality was good. Scan locations included subfoveal. Central Foveal Thickness: 930. Progression has worsened. Findings include epiretinal membrane.   Left Eye Quality was good. Scan locations included subfoveal. Progression has been stable.   Notes  Retina attached, with severe epiretinal membrane with peripheral traction extension     Color Fundus Photography Optos - OU - Both Eyes       Right Eye Disc findings include normal observations. Macula : epiretinal membrane.   Left Eye Progression has been stable. Disc findings include normal observations. Macula : normal observations. Vessels : normal observations. Periphery : normal observations.   Notes Clear oil retina attached.  Good retinopexy OD, observe  Epiretinal membrane with topographic distortion has progressed along the macula on the inferotemporal arcade extending to the temporal periphery centered at the 8 to 9:30 position.  With progression of macular traction OD from epiretinal membrane no visible subretinal fluid or break             ASSESSMENT/PLAN:  Traction retinal detachment involving macula, right Retina macula periphery a attached yet tractional change on the macular progress now unacceptable level require intervention for the recurrent PVR OD  Epiretinal membrane, right eye Severe epiretinal membrane with peripheral traction extension, no retinal breaks seen on clinical examination.  Thus vitrectomy membrane peel with use of instruments in the silicone oil setting and no removal of silicone or planned, will be undertaken with the option of removal and replacement of silicone oil if required at the time of surgery      ICD-10-CM   1.  Traction retinal detachment involving macula, right  H33.41 OCT, Retina - OU - Both Eyes    Color Fundus Photography Optos - OU - Both Eyes    2. Epiretinal membrane, right eye  H35.371       1.  OD will proceed with vitrectomy membrane peel under the silicone oil right eye.  Option will be to remove and replace the silicone oil if required.  In the absence of rhegmatogenous component of detachment, it is likely that the traction we can be relieved with with membrane forceps peel only.  2.  3.  Ophthalmic Meds Ordered this visit:  No orders of the defined types were placed in this encounter.      Return ,,, [facility], [facility], for Schedule vitrectomy membrane [phone], oil in place,.  There are no Patient Instructions on file for this visit.   Explained the diagnoses, plan, and follow up with the patient and they expressed understanding.  Patient expressed understanding of the importance of proper follow up care.   Clent Demark Tandre Conly M.D. Diseases & Surgery of the Retina and Vitreous [facility] 05/30/21     Abbreviations: M myopia (nearsighted); A astigmatism; H hyperopia (farsighted); P presbyopia; Mrx spectacle prescription;  CTL contact lenses; OD right eye; OS left eye; OU both eyes  XT exotropia; ET esotropia; PEK punctate epithelial keratitis; PEE punctate epithelial erosions; DES dry eye syndrome; MGD meibomian gland dysfunction; ATs artificial tears; PFAT's preservative free artificial tears; [place] nuclear sclerotic cataract; PSC posterior subcapsular cataract; ERM epi-retinal membrane; PVD posterior vitreous detachment; RD retinal detachment; DM diabetes mellitus; DR diabetic retinopathy; NPDR non-proliferative diabetic retinopathy; PDR proliferative diabetic retinopathy; CSME clinically significant macular edema; DME diabetic macular edema; dbh dot blot hemorrhages; CWS cotton wool spot; POAG primary open angle glaucoma; C/D cup-to-disc ratio; HVF  humphrey visual field; GVF goldmann visual field; OCT optical coherence tomography; IOP intraocular pressure; BRVO Branch retinal vein occlusion; CRVO central retinal vein occlusion; CRAO central retinal artery occlusion; BRAO branch retinal artery occlusion; RT retinal tear; SB scleral buckle; PPV pars plana vitrectomy; VH Vitreous hemorrhage; PRP panretinal laser photocoagulation; IVK intravitreal kenalog; VMT vitreomacular traction; MH Macular hole;  NVD neovascularization of the disc; NVE neovascularization elsewhere; AREDS age related eye disease study; ARMD age related macular degeneration; POAG primary open angle glaucoma; EBMD epithelial/anterior basement membrane dystrophy; ACIOL anterior chamber intraocular lens; IOL intraocular lens; PCIOL posterior chamber intraocular lens; Phaco/IOL phacoemulsification with intraocular lens placement; [place] photorefractive keratectomy; LASIK laser assisted in situ keratomileusis; HTN hypertension; DM diabetes mellitus; COPD chronic obstructive pulmonary disease

## 2021-05-30 NOTE — Assessment & Plan Note (Signed)
Retina macula periphery a attached yet tractional change on the macular progress now unacceptable level require intervention for the recurrent PVR OD

## 2021-05-30 NOTE — Assessment & Plan Note (Signed)
Severe epiretinal membrane with peripheral traction extension, no retinal breaks seen on clinical examination.  Thus vitrectomy membrane peel with use of instruments in the silicone oil setting and no removal of silicone or planned, will be undertaken with the option of removal and replacement of silicone oil if required at the time of surgery

## 2021-06-06 ENCOUNTER — Encounter (AMBULATORY_SURGERY_CENTER): Payer: 59 | Admitting: Ophthalmology

## 2021-06-06 DIAGNOSIS — H35371 Puckering of macula, right eye: Secondary | ICD-10-CM | POA: Diagnosis not present

## 2021-06-07 ENCOUNTER — Ambulatory Visit (INDEPENDENT_AMBULATORY_CARE_PROVIDER_SITE_OTHER): Payer: 59 | Admitting: Ophthalmology

## 2021-06-07 ENCOUNTER — Encounter (INDEPENDENT_AMBULATORY_CARE_PROVIDER_SITE_OTHER): Payer: Self-pay | Admitting: Ophthalmology

## 2021-06-07 ENCOUNTER — Other Ambulatory Visit: Payer: Self-pay

## 2021-06-07 DIAGNOSIS — H35371 Puckering of macula, right eye: Secondary | ICD-10-CM

## 2021-06-07 NOTE — Progress Notes (Signed)
06/07/2021     CHIEF COMPLAINT Patient presents for  Chief Complaint  Patient presents with   Post-op Follow-up    2 week post op od OCT/FP sx on 03/07/2021 Pt states, "I am concerned about my lid still being so swollen and my eye not being able to open entirely. I also need to know if I can have a refill of the red top drop that I use at night for discomfort. I cannot tell if my vision is any better looking through this oil but it is not any worse." I have been out of drops for about a week.       HISTORY OF PRESENT ILLNESS: Valerie Heath is a 70 y.o. female who presents to the clinic today for:   HPI     Post-op Follow-up   In right eye.  Discomfort includes itching, foreign body sensation (Scratchy feeling per patient.) and tearing (Eye has been running since surgery.).  Negative for pain (Aches occasionally, not as often, 2/10 pain scale.).  Vision is stable. Additional comments: 2 week post op od OCT/FP sx on 03/07/2021 Pt states, "I am concerned about my lid still being so swollen and my eye not being able to open entirely. I also need to know if I can have a refill of the red top drop that I use at night for discomfort. I cannot tell if my vision is any better looking through this oil but it is not any worse." I have been out of drops for about a week.         Comments   1 day PO OD sx 06/06/2021. Surgery for ERM, vitrectomy, membrane peel, right eye. Pt states vision is blurred "but seems like I am seeing a little better today."       Last edited by Laurin Coder on 06/07/2021  8:45 AM.      Referring physician: Ginger Organ., MD [place],  [place] [address]  HISTORICAL INFORMATION:   Selected notes from the [facility]: No current outpatient medications on file. (Ophthalmic Drugs)   No current facility-administered medications for this visit. (Ophthalmic Drugs)   Current Outpatient Medications  (Other)  Medication Sig   anastrozole (ARIMIDEX) 1 MG tablet TAKE 1 TABLET BY MOUTH EVERY DAY   Cholecalciferol (VITAMIN D) 50 MCG (2000 UT) CAPS Take 1 capsule (2,000 Units total) by mouth daily.   clobetasol cream (TEMOVATE) 0.05 % Apply to affected area as needed   levothyroxine (SYNTHROID) 112 MCG tablet Take 1 tablet (112 mcg total) by mouth daily before breakfast.   valACYclovir (VALTREX) 500 MG tablet TAKE 4 TABLETS BY MOUTH ON START OF UCLERATION, THEN 4 TABLETS 12 HOURS LATER   No current facility-administered medications for this visit. (Other)      REVIEW OF SYSTEMS:    ALLERGIES Allergies  Allergen Reactions   Erythromycin Nausea Only    PAST MEDICAL HISTORY Past Medical History:  Diagnosis Date   Breast cancer ([facility])    Breast cancer of upper-outer quadrant of left female breast ([facility])    Breast cancer of upper-outer quadrant of right female breast ([facility])    Cancer ([facility])    breast cancer-lt   Fibroid    Hypothyroid    Personal history of radiation therapy    Posterior vitreous detachment of 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   Radiation 06/16/14-08/01/14   left and right breast    Past  Surgical History:  Procedure Laterality Date   BREAST BIOPSY     BREAST LUMPECTOMY Bilateral    2015   BREAST LUMPECTOMY WITH RADIOACTIVE SEED LOCALIZATION Left 02/21/2014   Procedure: BREAST LUMPECTOMY WITH RADIOACTIVE SEED LOCALIZATION;  Surgeon: Adin Hector, MD;  Location: [facility];  Service: General;  Laterality: Left;   BREAST SURGERY Right 04/2014   Lumpectomy    BREAST SURGERY Left    COLONOSCOPY     LYMPH NODE DISSECTION  04/27/14   right and left   MYOMECTOMY     REFRACTIVE SURGERY     X 2   SHOULDER SURGERY  2005   left   TONSILLECTOMY AND ADENOIDECTOMY      FAMILY HISTORY Family History  Problem Relation Age of Onset   Hypertension Mother    Breast cancer Mother 2   Cancer Mother 69       breast cancer at 25 ; Fallopian tube tumor in her 41s    Diabetes Father    Hypertension Father    Heart disease Father    Prostate cancer Father    Cancer Father 81       prostate cancer   Colon cancer Paternal Uncle    Cancer Paternal Uncle 91       colon cancer   Throat cancer Maternal Uncle    Cancer Maternal Uncle 45       prostate cancer   Prostate cancer Maternal Uncle    Breast cancer Maternal Aunt 31   Leukemia Paternal Grandfather    Leukemia Maternal Aunt    Cancer Paternal Uncle 58       brain cancer   Brain cancer Paternal Uncle     SOCIAL HISTORY Social History   Tobacco Use   Smoking status: Never   Smokeless tobacco: Never  Vaping Use   Vaping Use: Never used  Substance Use Topics   Alcohol use: Yes    Alcohol/week: 2.0 standard drinks    Types: 2 Standard drinks or equivalent per week    Comment: Socially   Drug use: No         OPHTHALMIC EXAM:  Base Eye Exam     Visual Acuity (ETDRS)       Right Left   Dist [place] 20/400 20/20   Dist ph [place] 20/200          Tonometry (Tonopen, 8:47 AM)       Right Left   Pressure 16 14         Pupils       Dark Light Shape React APD   Right Dilated.    None   Left 4 3 Round Brisk None         Neuro/Psych     Oriented x3: Yes   Mood/Affect: Normal         Dilation     Right eye: 1.0% Mydriacyl, 2.5% Phenylephrine @ 8:47 AM           Slit Lamp and Fundus Exam     External Exam       Right Left   External Normal          Slit Lamp Exam       Right Left   Lids/Lashes 1+ edema    Conjunctiva/Sclera Injection 2+, well closed, no exposed Vicryl    Cornea Clear    Anterior Chamber Deep and quiet    Iris Round and reactive    Lens Centered posterior  chamber intraocular lens    Anterior Vitreous Clear oil          Fundus Exam       Right Left   Posterior Vitreous Clear silicone oil    Disc Normal    C/D Ratio 0.2    Macula Macula attached, fovea macula clearly seen.  No topographic distortion remains no preretinal  fibrosis    Vessels Normal    Periphery Retinopexy surrounding retinotomy inferior,*fold along the inferotemporal posterior slope of the buckle now resolved post vitrectomy yesterday, retina attached, good laser retinopexy in this region             IMAGING AND PROCEDURES  Imaging and Procedures for 06/07/21           ASSESSMENT/PLAN:  Epiretinal membrane, right eye Postop day #1 status post vitrectomy removal of severe epiretinal membrane over the macula extending to the inferotemporal arcade to the retinal periphery  Today looks great macula nicely unfolded beneath the oil clear oil.     ICD-10-CM   1. Epiretinal membrane, right eye  H35.371       1.  Postop day #1 status post vitrectomy within silicone oil OD with no fill or refill.  Excellent postoperative appearance clinically today.  We will document with color fundus photography as well as OCT.  2.  Patient to resume topical medications today.  3.  Ophthalmic Meds Ordered this visit:  No orders of the defined types were placed in this encounter.      Return in about 1 week (around 06/14/2021) for dilate, OD, COLOR FP, OCT.  Patient Instructions  POST OP RETINAL DETACHMENT REPAIR, USE OF OIL- The operative eye has oil in place.  Do not sleep, rest flat on back for long periods of time, preferably, no longer than 10 minutes.  Sleep or rest on either side.  Travel may include to regions of elevation including mountains, or the coase.  Airline travel is permitted.   Oil remains in place OD.  Macula looks great status post vitrectomy membrane peel within the oil yesterday.  Ofloxacin  4 times daily to the operative eye  Prednisolone acetate 1 drop to the operative eye 4 times daily  Patient instructed not to refill the medications and use them for maximum of 3 weeks.  Patient instructed do not rub the eye.  Patient has the option to use the patch at night.   Explained the diagnoses, plan, and follow up with  the patient and they expressed understanding.  Patient expressed understanding of the importance of proper follow up care.   Clent Demark Haniah Penny M.D. Diseases & Surgery of the Retina and Vitreous [facility] 06/07/21     Abbreviations: M myopia (nearsighted); A astigmatism; H hyperopia (farsighted); P presbyopia; Mrx spectacle prescription;  CTL contact lenses; OD right eye; OS left eye; OU both eyes  XT exotropia; ET esotropia; PEK punctate epithelial keratitis; PEE punctate epithelial erosions; DES dry eye syndrome; MGD meibomian gland dysfunction; ATs artificial tears; PFAT's preservative free artificial tears; [place] nuclear sclerotic cataract; PSC posterior subcapsular cataract; ERM epi-retinal membrane; PVD posterior vitreous detachment; RD retinal detachment; DM diabetes mellitus; DR diabetic retinopathy; NPDR non-proliferative diabetic retinopathy; PDR proliferative diabetic retinopathy; CSME clinically significant macular edema; DME diabetic macular edema; dbh dot blot hemorrhages; CWS cotton wool spot; POAG primary open angle glaucoma; C/D cup-to-disc ratio; HVF humphrey visual field; GVF goldmann visual field; OCT optical coherence tomography; IOP intraocular pressure; BRVO Branch retinal vein occlusion;  CRVO central retinal vein occlusion; CRAO central retinal artery occlusion; BRAO branch retinal artery occlusion; RT retinal tear; SB scleral buckle; PPV pars plana vitrectomy; VH Vitreous hemorrhage; PRP panretinal laser photocoagulation; IVK intravitreal kenalog; VMT vitreomacular traction; MH Macular hole;  NVD neovascularization of the disc; NVE neovascularization elsewhere; AREDS age related eye disease study; ARMD age related macular degeneration; POAG primary open angle glaucoma; EBMD epithelial/anterior basement membrane dystrophy; ACIOL anterior chamber intraocular lens; IOL intraocular lens; PCIOL posterior chamber intraocular lens; Phaco/IOL phacoemulsification with  intraocular lens placement; [place] photorefractive keratectomy; LASIK laser assisted in situ keratomileusis; HTN hypertension; DM diabetes mellitus; COPD chronic obstructive pulmonary disease

## 2021-06-07 NOTE — Assessment & Plan Note (Signed)
Postop day #1 status post vitrectomy removal of severe epiretinal membrane over the macula extending to the inferotemporal arcade to the retinal periphery  Today looks great macula nicely unfolded beneath the oil clear oil.

## 2021-06-07 NOTE — Addendum Note (Signed)
Addended by: Deloria Lair A on: 06/07/2021 01:07 PM   Modules accepted: Orders

## 2021-06-07 NOTE — Patient Instructions (Addendum)
POST OP RETINAL DETACHMENT REPAIR, USE OF OIL- The operative eye has oil in place.  Do not sleep, rest flat on back for long periods of time, preferably, no longer than 10 minutes.  Sleep or rest on either side.  Travel may include to regions of elevation including mountains, or the coase.  Airline travel is permitted.   Oil remains in place OD.  Macula looks great status post vitrectomy membrane peel within the oil yesterday.  Ofloxacin  4 times daily to the operative eye  Prednisolone acetate 1 drop to the operative eye 4 times daily  Patient instructed not to refill the medications and use them for maximum of 3 weeks.  Patient instructed do not rub the eye.  Patient has the option to use the patch at night.

## 2021-06-11 ENCOUNTER — Other Ambulatory Visit: Payer: Self-pay | Admitting: Hematology and Oncology

## 2021-06-11 DIAGNOSIS — C50411 Malignant neoplasm of upper-outer quadrant of right female breast: Secondary | ICD-10-CM

## 2021-06-14 ENCOUNTER — Other Ambulatory Visit: Payer: Self-pay

## 2021-06-14 ENCOUNTER — Encounter (INDEPENDENT_AMBULATORY_CARE_PROVIDER_SITE_OTHER): Payer: Self-pay | Admitting: Ophthalmology

## 2021-06-14 ENCOUNTER — Ambulatory Visit (INDEPENDENT_AMBULATORY_CARE_PROVIDER_SITE_OTHER): Payer: 59 | Admitting: Ophthalmology

## 2021-06-14 DIAGNOSIS — H35371 Puckering of macula, right eye: Secondary | ICD-10-CM

## 2021-06-14 DIAGNOSIS — H3341 Traction detachment of retina, right eye: Secondary | ICD-10-CM

## 2021-06-14 NOTE — Assessment & Plan Note (Signed)
Retina attached under the oil, patient continues on oral vitamin B complex

## 2021-06-14 NOTE — Assessment & Plan Note (Signed)
No recurrence of topographic distortion in the macular region associate with improved acuity, retina attached

## 2021-06-14 NOTE — Progress Notes (Signed)
06/14/2021     CHIEF COMPLAINT Patient presents for  Chief Complaint  Patient presents with   Routine Post Op      HISTORY OF PRESENT ILLNESS: Valerie Heath is a 70 y.o. female who presents to the clinic today for:   HPI   1 week Post Op OD with FP (Sx: 06/06/21)  Pt states she thinks the vision may be improving some in the right eye. Pt c/o occasional aching pain, very brief, occurs once every 2-3 days. Pt also c/o having intermittent nausea. Pt denies any new flashes or floaters.   Eye Meds: PF QID OD Ofloxacin QID OD Last edited by Reather Littler, COA on 06/14/2021  8:06 AM.      Referring physician: Ginger Organ., MD [place],  [place] [address]  HISTORICAL INFORMATION:   Selected notes from the MEDICAL RECORD NUMBER       CURRENT MEDICATIONS: No current outpatient medications on file. (Ophthalmic Drugs)   No current facility-administered medications for this visit. (Ophthalmic Drugs)   Current Outpatient Medications (Other)  Medication Sig   anastrozole (ARIMIDEX) 1 MG tablet TAKE 1 TABLET BY MOUTH EVERY DAY   Cholecalciferol (VITAMIN D) 50 MCG (2000 UT) CAPS Take 1 capsule (2,000 Units total) by mouth daily.   clobetasol cream (TEMOVATE) 0.05 % Apply to affected area as needed   levothyroxine (SYNTHROID) 112 MCG tablet Take 1 tablet (112 mcg total) by mouth daily before breakfast.   valACYclovir (VALTREX) 500 MG tablet TAKE 4 TABLETS BY MOUTH ON START OF UCLERATION, THEN 4 TABLETS 12 HOURS LATER   No current facility-administered medications for this visit. (Other)      REVIEW OF SYSTEMS:    ALLERGIES Allergies  Allergen Reactions   Erythromycin Nausea Only    PAST MEDICAL HISTORY Past Medical History:  Diagnosis Date   Breast cancer ([facility])    Breast cancer of upper-outer quadrant of left female breast ([facility])    Breast cancer of upper-outer quadrant of right female breast ([facility])    Cancer ([facility])    breast cancer-lt    Fibroid    Hypothyroid    Personal history of radiation therapy    Posterior vitreous detachment of 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   Radiation 06/16/14-08/01/14   left and right breast    Past Surgical History:  Procedure Laterality Date   BREAST BIOPSY     BREAST LUMPECTOMY Bilateral    2015   BREAST LUMPECTOMY WITH RADIOACTIVE SEED LOCALIZATION Left 02/21/2014   Procedure: BREAST LUMPECTOMY WITH RADIOACTIVE SEED LOCALIZATION;  Surgeon: Adin Hector, MD;  Location: [facility];  Service: General;  Laterality: Left;   BREAST SURGERY Right 04/2014   Lumpectomy    BREAST SURGERY Left    COLONOSCOPY     LYMPH NODE DISSECTION  04/27/14   right and left   MYOMECTOMY     REFRACTIVE SURGERY     X 2   SHOULDER SURGERY  2005   left   TONSILLECTOMY AND ADENOIDECTOMY      FAMILY HISTORY Family History  Problem Relation Age of Onset   Hypertension Mother    Breast cancer Mother 16   Cancer Mother 45       breast cancer at 63 ; Fallopian tube tumor in her 39s   Diabetes Father    Hypertension Father    Heart disease Father    Prostate cancer Father    Cancer Father 76  prostate cancer   Colon cancer Paternal Uncle    Cancer Paternal Uncle 33       colon cancer   Throat cancer Maternal Uncle    Cancer Maternal Uncle 45       prostate cancer   Prostate cancer Maternal Uncle    Breast cancer Maternal Aunt 32   Leukemia Paternal Grandfather    Leukemia Maternal Aunt    Cancer Paternal Uncle 39       brain cancer   Brain cancer Paternal Uncle     SOCIAL HISTORY Social History   Tobacco Use   Smoking status: Never   Smokeless tobacco: Never  Vaping Use   Vaping Use: Never used  Substance Use Topics   Alcohol use: Yes    Alcohol/week: 2.0 standard drinks    Types: 2 Standard drinks or equivalent per week    Comment: Socially   Drug use: No         OPHTHALMIC EXAM:  Base Eye Exam     Visual Acuity (ETDRS)       Right Left   Dist [place] 20/400 20/25 -1    Dist ph [place] 20/150          Tonometry (Tonopen, 8:11 AM)       Right Left   Pressure 12 16         Pupils       Dark Light Shape React APD   Right 4 4 Round Minimal +1   Left 4 3 Round Brisk None         Visual Fields (Counting fingers)       Left Right    Full Full         Extraocular Movement       Right Left    Full, Ortho Full, Ortho         Neuro/Psych     Oriented x3: Yes   Mood/Affect: Normal         Dilation     Right eye: 1.0% Mydriacyl, 2.5% Phenylephrine @ 8:11 AM           Slit Lamp and Fundus Exam     External Exam       Right Left   External Normal          Slit Lamp Exam       Right Left   Lids/Lashes 1+ edema    Conjunctiva/Sclera Injection 2+, well closed, no exposed Vicryl    Cornea Clear    Anterior Chamber Deep and quiet    Iris Round and reactive    Lens Centered posterior chamber intraocular lens    Anterior Vitreous Clear oil          Fundus Exam       Right Left   Posterior Vitreous Clear silicone oil    Disc Normal    C/D Ratio 0.2    Macula Macula attached, fovea macula clearly seen.  No topographic distortion remains no preretinal fibrosis    Vessels Normal    Periphery Retinopexy surrounding retinotomy inferior,*fold along the inferotemporal posterior slope of the buckle now resolved post vitrectomy yesterday, retina attached, good laser retinopexy in this region             IMAGING AND PROCEDURES  Imaging and Procedures for 06/14/21  Color Fundus Photography Optos - OU - Both Eyes       Right Eye Disc findings include normal observations. Vessels : normal observations.   Left  Eye Progression has been stable. Disc findings include normal observations. Macula : normal observations. Vessels : normal observations. Periphery : normal observations.   Notes Clear oil retina attached.  Good retinopexy OD, observe, no topographic distortion remains in the macula as compared to preoperative  from September or 7 photographs with severe epiretinal membrane at that time now resolved  Clear media, clear oil.  Much less macular topographic distortion.  Macula flat.  Good retinopexy temporally             ASSESSMENT/PLAN:  Epiretinal membrane, right eye No recurrence of topographic distortion in the macular region associate with improved acuity, retina attached  Traction retinal detachment involving macula, right Retina attached under the oil, patient continues on oral vitamin B complex     ICD-10-CM   1. Epiretinal membrane, right eye  H35.371 Color Fundus Photography Optos - OU - Both Eyes    2. Traction retinal detachment involving macula, right  H33.41       1.  OD with improving acuity 88/916 beneath the silicone oil.  1 week post vitrectomy in the oil under oil for severe macular topographic distortion from epiretinal membrane  2.  3.  Ophthalmic Meds Ordered this visit:  No orders of the defined types were placed in this encounter.      Return in about 5 weeks (around 07/19/2021) for dilate, OD, COLOR FP, OCT.  Patient Instructions  Ofloxacin  4 times daily to the operative eye  Prednisolone acetate 1 drop to the operative eye 4 times daily  Patient instructed not to refill the medications and use them for maximum of 3 weeks.  Patient instructed do not rub the eye.  Patient has the option to use the patch at night.   OD, will complete 2 more weeks of topical medications post most recent surgical intervention for severe macular topographic distortion.    Explained the diagnoses, plan, and follow up with the patient and they expressed understanding.  Patient expressed understanding of the importance of proper follow up care.   Clent Demark Myles Tavella M.D. Diseases & Surgery of the Retina and Vitreous [facility]ina [facility] Diabetic [place] 06/14/21     Abbreviations: M myopia (nearsighted); A astigmatism; H hyperopia (farsighted); P presbyopia; Mrx spectacle  prescription;  CTL contact lenses; OD right eye; OS left eye; OU both eyes  XT exotropia; ET esotropia; PEK punctate epithelial keratitis; PEE punctate epithelial erosions; DES dry eye syndrome; MGD meibomian gland dysfunction; ATs artificial tears; PFAT's preservative free artificial tears; [place] nuclear sclerotic cataract; PSC posterior subcapsular cataract; ERM epi-retinal membrane; PVD posterior vitreous detachment; RD retinal detachment; DM diabetes mellitus; DR diabetic retinopathy; NPDR non-proliferative diabetic retinopathy; PDR proliferative diabetic retinopathy; CSME clinically significant macular edema; DME diabetic macular edema; dbh dot blot hemorrhages; CWS cotton wool spot; POAG primary open angle glaucoma; C/D cup-to-disc ratio; HVF humphrey visual field; GVF goldmann visual field; OCT optical coherence tomography; IOP intraocular pressure; BRVO Branch retinal vein occlusion; CRVO central retinal vein occlusion; CRAO central retinal artery occlusion; BRAO branch retinal artery occlusion; RT retinal tear; SB scleral buckle; PPV pars plana vitrectomy; VH Vitreous hemorrhage; PRP panretinal laser photocoagulation; IVK intravitreal kenalog; VMT vitreomacular traction; MH Macular hole;  NVD neovascularization of the disc; NVE neovascularization elsewhere; AREDS age related eye disease study; ARMD age related macular degeneration; POAG primary open angle glaucoma; EBMD epithelial/anterior basement membrane dystrophy; ACIOL anterior chamber intraocular lens; IOL intraocular lens; PCIOL posterior chamber intraocular lens; Phaco/IOL phacoemulsification with intraocular lens placement; PRK photorefractive  keratectomy; LASIK laser assisted in situ keratomileusis; HTN hypertension; DM diabetes mellitus; COPD chronic obstructive pulmonary disease

## 2021-06-14 NOTE — Patient Instructions (Signed)
Ofloxacin  4 times daily to the operative eye  Prednisolone acetate 1 drop to the operative eye 4 times daily  Patient instructed not to refill the medications and use them for maximum of 3 weeks.  Patient instructed do not rub the eye.  Patient has the option to use the patch at night.   OD, will complete 2 more weeks of topical medications post most recent surgical intervention for severe macular topographic distortion.

## 2021-06-27 NOTE — Progress Notes (Signed)
Patient Care Team: Ginger Organ., MD as PCP - General (Internal Medicine)  DIAGNOSIS:    ICD-10-CM   1. Malignant neoplasm of upper-outer quadrant of left breast in female, estrogen receptor positive ([facility])  C50.412    Z17.0     2. Malignant neoplasm of upper-outer quadrant of right breast in female, estrogen receptor positive ([place] [facility])  C50.411    Z17.0       SUMMARY OF ONCOLOGIC HISTORY: Oncology History  Breast cancer of upper-outer quadrant of right female breast ([facility])  03/29/2014 Initial Diagnosis   MRI finding of suspicious changes   04/27/2014 Surgery   Right breast lumpectomy: Fibrocystic changes; 5 sentinel lymph nodes negative   Breast cancer of upper-outer quadrant of left female breast ([facility])  03/29/2014 Initial Diagnosis   Breast cancer of upper-outer quadrant of left female breast   04/27/2014 Surgery   Left breast lumpectomy: Invasive ductal carcinoma grade 1/3; 1.5 cm with DCIS, ER 100%, PR 53%, Ki-67 8%, HER-2 negative ratio 1.21   06/14/2014 - 08/01/2014 Radiation Therapy   Adjuvant radiation therapy   07/26/2014 Procedure   [facility]. Genetic testing was normal, and did not reveal a deleterious mutation in these genes. A complete list of all genes tested is located on the test report scanned into EPIC.     08/02/2014 -  Anti-estrogen oral therapy   Arimidex 1 mg daily X 5 years     CHIEF COMPLIANT: Follow-up on left breast cancer on Arimidex therapy  INTERVAL HISTORY: Valerie Heath is a 70 y.o. with above-mentioned history of left breast cancer underwent lumpectomy, radiation, and who is currently on oral antiestrogen therapy with anastrozole. Mammogram on 02/16/2021 showed no evidence of malignancy bilaterally. She presents to the clinic today for annual follow-up.  She has noted a cutaneous bleed on her left breast.  She has noticed 1 before it went away and it has happened again.  There is no lumps or nodules.  She has noticed significant  breast drying and she has been applying Cetaphil ointment which has been helping her dryness.  She completes antiestrogen therapy as of now.  ALLERGIES:  is allergic to erythromycin.  MEDICATIONS:  Current Outpatient Medications  Medication Sig Dispense Refill   b complex vitamins capsule Take 1 capsule by mouth daily.     Cholecalciferol (VITAMIN D) 50 MCG (2000 UT) CAPS Take 1 capsule (2,000 Units total) by mouth daily. 30 capsule    clobetasol cream (TEMOVATE) 0.05 % Apply to affected area as needed 60 g 1   levothyroxine (SYNTHROID) 125 MCG tablet Take 1 tablet (125 mcg total) by mouth daily before breakfast.     valACYclovir (VALTREX) 500 MG tablet TAKE 4 TABLETS BY MOUTH ON START OF UCLERATION, THEN 4 TABLETS 12 HOURS LATER  3   No current facility-administered medications for this visit.    PHYSICAL EXAMINATION: ECOG PERFORMANCE STATUS: 1 - Symptomatic but completely ambulatory  Vitals:   06/28/21 1109  BP: (!) 178/73  Pulse: 86  Resp: 18  Temp: 97.7 F (36.5 C)  SpO2: 99%   Filed Weights   06/28/21 1109  Weight: 152 lb 4.8 oz (69.1 kg)    BREAST: No palpable masses or nodules in either right or left breasts. No palpable axillary supraclavicular or infraclavicular adenopathy no breast tenderness or nipple discharge. (exam performed in the presence of a chaperone)  LABORATORY DATA:  I have reviewed the data as listed No flowsheet data found.  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 Oncotype DX recurrence score was 8 which translates into a 6% risk of distant recurrence  Status post radiation therapy completed November 2015, started anastrozole 08/02/2014 completed  October 2022    Anastrozole toxicities: Patient does not have any side effects from anastrozole. With this she concludes 7 years of antiestrogen therapy.  I recommended that she discontinue it at this time.   Breast Cancer Surveillance: 1. Breast exam 06/28/2021: Small blood blister that has busted and the does not appear to be any other concern from that standpoint. 2. Mammogram 02/16/2021: Bilateral mammograms and bilateral ultrasounds: No mammographic evidence of malignancy breast density category C      Breast cancer of upper-outer quadrant of right 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) Right breast DCIS ER/PR positive: status post lumpectomy ER/PR positive: Completed anastrozole therapy.    Return to clinic in 1 year for follow-up.  I offered her follow-up with her primary care but she would like to come see his once a year.  No orders of the defined types were placed in this encounter.  The patient has a good understanding of the overall plan. she agrees with it. she will call with any problems that may develop before the next visit here.  Total time spent: 20 mins including face to face time and time spent for planning, charting and coordination of care  Rulon Eisenmenger, MD, MPH 06/28/2021  I, Thana Ates, am acting as scribe for Dr. Nicholas Lose.  I have reviewed the above documentation for accuracy and completeness, and I agree with the above.

## 2021-06-28 ENCOUNTER — Inpatient Hospital Stay: Payer: 59 | Attending: Hematology and Oncology | Admitting: Hematology and Oncology

## 2021-06-28 ENCOUNTER — Other Ambulatory Visit: Payer: Self-pay

## 2021-06-28 DIAGNOSIS — Z170 Estrogen receptor positive status [ER+]: Secondary | ICD-10-CM | POA: Diagnosis not present

## 2021-06-28 DIAGNOSIS — Z923 Personal history of irradiation: Secondary | ICD-10-CM | POA: Diagnosis not present

## 2021-06-28 DIAGNOSIS — C50412 Malignant neoplasm of upper-outer quadrant of left female breast: Secondary | ICD-10-CM

## 2021-06-28 DIAGNOSIS — Z853 Personal history of malignant neoplasm of breast: Secondary | ICD-10-CM | POA: Diagnosis present

## 2021-06-28 DIAGNOSIS — C50411 Malignant neoplasm of upper-outer quadrant of right female breast: Secondary | ICD-10-CM

## 2021-06-28 MED ORDER — B COMPLEX VITAMINS PO CAPS: 1.0000 | ORAL_CAPSULE | Freq: Every day | ORAL | Status: AC

## 2021-06-28 MED ORDER — LEVOTHYROXINE SODIUM 125 MCG PO TABS: 125.0000 ug | ORAL_TABLET | Freq: Every day | ORAL | Status: AC

## 2021-06-28 NOTE — Assessment & Plan Note (Signed)
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 breast exam and mammogram were reviewed and theywere normal

## 2021-06-28 NOTE — Assessment & Plan Note (Signed)
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 Oncotype DX recurrence score was 8 which translates into a 6% risk of distant recurrence Status post radiation therapy completed November 2015, started anastrozole 08/02/2014 completed October 2022  Anastrozole toxicities: Patient does not have any side effects from anastrozole. With this she concludes 7 years of antiestrogen therapy.  I recommended that she discontinue it at this time.  Breast Cancer Surveillance: 1. Breast exam10/02/2021: Normal 2. Mammogram5/27/2022: Bilateral mammograms and bilateral ultrasounds: No mammographic evidence of malignancy breast density category C  Return to clinic in 1 year for follow-up

## 2021-07-05 ENCOUNTER — Other Ambulatory Visit: Payer: Self-pay

## 2021-07-05 ENCOUNTER — Ambulatory Visit (INDEPENDENT_AMBULATORY_CARE_PROVIDER_SITE_OTHER): Payer: 59 | Admitting: Obstetrics and Gynecology

## 2021-07-05 ENCOUNTER — Encounter: Payer: Self-pay | Admitting: Obstetrics and Gynecology

## 2021-07-05 ENCOUNTER — Encounter: Payer: 59 | Admitting: Obstetrics and Gynecology

## 2021-07-05 VITALS — BP 156/94 | HR 72 | Resp 12 | Ht 63.5 in | Wt 152.0 lb

## 2021-07-05 DIAGNOSIS — L900 Lichen sclerosus et atrophicus: Secondary | ICD-10-CM

## 2021-07-05 DIAGNOSIS — Z853 Personal history of malignant neoplasm of breast: Secondary | ICD-10-CM

## 2021-07-05 DIAGNOSIS — L309 Dermatitis, unspecified: Secondary | ICD-10-CM | POA: Diagnosis not present

## 2021-07-05 DIAGNOSIS — Z9189 Other specified personal risk factors, not elsewhere classified: Secondary | ICD-10-CM | POA: Diagnosis not present

## 2021-07-05 MED ORDER — CLOBETASOL PROPIONATE 0.05 % EX OINT: TOPICAL_OINTMENT | CUTANEOUS | 0 refills | Status: DC

## 2021-07-05 NOTE — Progress Notes (Signed)
70 y.o. G0P0 Married White or Caucasian Not Hispanic or Latino female here for annual exam.  No vaginal bleeding. Sexually active, no pain.    H/O lichen sclerosis, uses steroid ointment prn flare. Has a flare every couple of months.   H/O bilateral breast cancer. She had bilateral lumpectomies and radiation. Then treated with Arimidex (still on it) years. Last mammogram was in 5/21 and was normal. She has one more refill of arimidex, then will stop it.   She had a detached right retina in 5/22, she has had 4 surgeries, has a 5th surgery in November.   No LMP recorded. Patient is postmenopausal.          Sexually active: Yes.    The current method of family planning is post menopausal status.    Exercising: No.  The patient does not participate in regular exercise at present. Smoker:  no  Health Maintenance: Pap:  07-04-20 negative            06-26-18 negative, HR HPV negative  History of abnormal Pap:  no MMG:  02-14-21 density C/BIRADS 1 negative  BMD:   09-2020 with PCP  Colonoscopy: 2020 Dr. Collene Mares, f/u 5 years  TDaP:  UTD per patient Gardasil: no   reports that she has never smoked. She has never used smokeless tobacco. She reports current alcohol use of about 2.0 standard drinks per week. She reports that she does not use drugs. Retired, worked in a lab.   Past Medical History:  Diagnosis Date   Breast cancer ([facility])    Breast cancer of upper-outer quadrant of left female breast ([facility])    Breast cancer of upper-outer quadrant of right female breast ([facility])    Cancer ([facility])    breast cancer-lt   Fibroid    Hypothyroid    Personal history of radiation therapy    Posterior vitreous detachment of 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   Radiation 06/16/14-08/01/14   left and right breast     Past Surgical History:  Procedure Laterality Date   BREAST BIOPSY     BREAST LUMPECTOMY Bilateral    2015   BREAST LUMPECTOMY WITH RADIOACTIVE SEED LOCALIZATION Left 02/21/2014   Procedure: BREAST LUMPECTOMY WITH  RADIOACTIVE SEED LOCALIZATION;  Surgeon: Adin Hector, MD;  Location: [facility];  Service: General;  Laterality: Left;   BREAST SURGERY Right 04/2014   Lumpectomy    BREAST SURGERY Left    COLONOSCOPY     LYMPH NODE DISSECTION  04/27/14   right and left   MYOMECTOMY     REFRACTIVE SURGERY     X 2   SHOULDER SURGERY  2005   left   TONSILLECTOMY AND ADENOIDECTOMY      Current Outpatient Medications  Medication Sig Dispense Refill   b complex vitamins capsule Take 1 capsule by mouth daily.     Cholecalciferol (VITAMIN D) 50 MCG (2000 UT) CAPS Take 1 capsule (2,000 Units total) by mouth daily. 30 capsule    clobetasol cream (TEMOVATE) 0.05 % Apply to affected area as needed 60 g 1   levothyroxine (SYNTHROID) 125 MCG tablet Take 1 tablet (125 mcg total) by mouth daily before breakfast.     valACYclovir (VALTREX) 500 MG tablet TAKE 4 TABLETS BY MOUTH ON START OF UCLERATION, THEN 4 TABLETS 12 HOURS LATER  3   No current facility-administered medications for this visit.    Family History  Problem Relation Age of Onset   Hypertension Mother    Breast  cancer Mother 37   Cancer Mother 38       breast cancer at 58 ; Fallopian tube tumor in her 32s   Diabetes Father    Hypertension Father    Heart disease Father    Prostate cancer Father    Cancer Father 18       prostate cancer   Colon cancer Paternal Uncle    Cancer Paternal Uncle 55       colon cancer   Throat cancer Maternal Uncle    Cancer Maternal Uncle 45       prostate cancer   Prostate cancer Maternal Uncle    Breast cancer Maternal Aunt 59   Leukemia Paternal Grandfather    Leukemia Maternal Aunt    Cancer Paternal Uncle 10       brain cancer   Brain cancer Paternal Uncle     Review of Systems  All other systems reviewed and are negative.  Exam:   BP (!) 156/94 (BP Location: Right Arm, Patient Position: Sitting, Cuff Size: Normal)   Pulse 72   Resp 12   Ht 5' 3.5" (1.613 m)   Wt 152 lb (68.9  kg)   BMI 26.50 kg/m   Weight change: @WEIGHTCHANGE @ Height:   Height: 5' 3.5" (161.3 cm)  Ht Readings from Last 3 Encounters:  07/05/21 5' 3.5" (1.613 m)  06/28/21 5\' 5"  (1.651 m)  01/10/21 5\' 5"  (1.651 m)    General appearance: alert, cooperative and appears stated age Head: Normocephalic, without obvious abnormality, atraumatic Neck: no adenopathy, supple, symmetrical, trachea midline and thyroid normal to inspection and palpation Breasts: normal appearance, no masses or tenderness, evidence of bilateral lumpectomy and radiation. Abdomen: soft, non-tender; non distended,  no masses,  no organomegaly Extremities: extremities normal, atraumatic, no cyanosis or edema Skin: Skin color, texture, turgor normal. No rashes or lesions Lymph nodes: Cervical, supraclavicular, and axillary nodes normal. No abnormal inguinal nodes palpated Neurologic: Grossly normal   Pelvic: External genitalia:  no lesions, whitening on the perineum with fissure              Urethra:  normal appearing urethra with no masses, tenderness or lesions              Bartholins and Skenes: normal                 Vagina: very atrophic, appearing vagina with normal color and discharge, no lesions              Cervix: no lesions               Bimanual Exam:  Uterus:  normal size, contour, position, consistency, mobility, non-tender              Adnexa: no mass, fullness, tenderness               Rectovaginal: Confirms               Anus:  normal sphincter tone, no lesions  Perianal: erythema, whitening, fissure  Karmen Bongo, RN chaperoned for the exam.  1. GYN exam for high-risk Medicare patient Discussed breast self exam Discussed calcium and vit D intake Mammogram and colonoscopy are UTD DEXA with primary  2. Lichen sclerosus Intermittently flares Discussed vulvar skin care - clobetasol ointment (TEMOVATE) 0.05 %; Use a pea sized amount topically BID for up to 1-2 weeks as needed for a flare. Not for  daily long term use.  Dispense: 60 g; Refill: 0  3. Perianal dermatitis Discussed skin care - clobetasol ointment (TEMOVATE) 0.05 %; Use a pea sized amount topically BID for up to 1-2 weeks as needed for a flare. Not for daily long term use.  Dispense: 60 g; Refill: 0 -Use Vaseline as needed  4. History of breast cancer On her last 3 month script of Arimidex.

## 2021-07-05 NOTE — Patient Instructions (Signed)

## 2021-07-19 ENCOUNTER — Other Ambulatory Visit: Payer: Self-pay

## 2021-07-19 ENCOUNTER — Ambulatory Visit (INDEPENDENT_AMBULATORY_CARE_PROVIDER_SITE_OTHER): Payer: 59 | Admitting: Ophthalmology

## 2021-07-19 ENCOUNTER — Encounter (INDEPENDENT_AMBULATORY_CARE_PROVIDER_SITE_OTHER): Payer: Self-pay | Admitting: Ophthalmology

## 2021-07-19 DIAGNOSIS — H35371 Puckering of macula, right eye: Secondary | ICD-10-CM | POA: Diagnosis not present

## 2021-07-19 DIAGNOSIS — H3341 Traction detachment of retina, right eye: Secondary | ICD-10-CM

## 2021-07-19 NOTE — Progress Notes (Signed)
07/19/2021     CHIEF COMPLAINT Patient presents for  Chief Complaint  Patient presents with   Post-op Follow-up      HISTORY OF PRESENT ILLNESS: Valerie Heath is a 70 y.o. female who presents to the clinic today for:   HPI     Post-op Follow-up   In right eye.  Discomfort includes tearing and floaters.  Negative for pain and itching.  Vision is improved.        Comments   5 week Post Op OD, OCT FP (Sx: 06/06/21)  Pt states she thinks the vision may be improving some in the right eye "a little bit." Pt states her eyes are more teary than before within the last week, and notices a couple new floaters in the right eye. Pt is not using eye drops anymore.      Last edited by Laurin Coder on 07/19/2021  8:39 AM.      Referring physician: Ginger Organ., MD [place],   [id number]  HISTORICAL INFORMATION:   Selected notes from the MEDICAL RECORD NUMBER       CURRENT MEDICATIONS: No current outpatient medications on file. (Ophthalmic Drugs)   No current facility-administered medications for this visit. (Ophthalmic Drugs)   Current Outpatient Medications (Other)  Medication Sig   b complex vitamins capsule Take 1 capsule by mouth daily.   Cholecalciferol (VITAMIN D) 50 MCG (2000 UT) CAPS Take 1 capsule (2,000 Units total) by mouth daily.   clobetasol ointment (TEMOVATE) 0.05 % Use a pea sized amount topically BID for up to 1-2 weeks as needed for a flare. Not for daily long term use.   levothyroxine (SYNTHROID) 125 MCG tablet Take 1 tablet (125 mcg total) by mouth daily before breakfast.   valACYclovir (VALTREX) 500 MG tablet TAKE 4 TABLETS BY MOUTH ON START OF UCLERATION, THEN 4 TABLETS 12 HOURS LATER   No current facility-administered medications for this visit. (Other)      REVIEW OF SYSTEMS:    ALLERGIES Allergies  Allergen Reactions   Erythromycin Nausea Only    PAST MEDICAL HISTORY Past Medical History:  Diagnosis  Date   Breast cancer ([place])    Breast cancer of upper-outer quadrant of left female breast ([place])    Breast cancer of upper-outer quadrant of right female breast ([place])    Cancer ([place])    breast cancer-lt   Fibroid    Hypothyroid    Personal history of radiation therapy    Posterior vitreous detachment of 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   Radiation 06/16/14-08/01/14   left and right breast    Past Surgical History:  Procedure Laterality Date   BREAST BIOPSY     BREAST LUMPECTOMY Bilateral    2015   BREAST LUMPECTOMY WITH RADIOACTIVE SEED LOCALIZATION Left 02/21/2014   Procedure: BREAST LUMPECTOMY WITH RADIOACTIVE SEED LOCALIZATION;  Surgeon: Adin Hector, MD;  Location: [facility];  Service: General;  Laterality: Left;   BREAST SURGERY Right 04/2014   Lumpectomy    BREAST SURGERY Left    COLONOSCOPY     LYMPH NODE DISSECTION  04/27/14   right and left   MYOMECTOMY     REFRACTIVE SURGERY     X 2   SHOULDER SURGERY  2005   left   TONSILLECTOMY AND ADENOIDECTOMY      FAMILY HISTORY Family History  Problem Relation Age of Onset   Hypertension Mother    Breast cancer Mother 74  Cancer Mother 59       breast cancer at 10 ; Fallopian tube tumor in her 35s   Diabetes Father    Hypertension Father    Heart disease Father    Prostate cancer Father    Cancer Father 74       prostate cancer   Colon cancer Paternal Uncle    Cancer Paternal Uncle 67       colon cancer   Throat cancer Maternal Uncle    Cancer Maternal Uncle 45       prostate cancer   Prostate cancer Maternal Uncle    Breast cancer Maternal Aunt 60   Leukemia Paternal Grandfather    Leukemia Maternal Aunt    Cancer Paternal Uncle 73       brain cancer   Brain cancer Paternal Uncle     SOCIAL HISTORY Social History   Tobacco Use   Smoking status: Never   Smokeless tobacco: Never  Vaping Use   Vaping Use: Never used  Substance Use Topics   Alcohol use: Yes    Alcohol/week: 2.0 standard drinks     Types: 2 Standard drinks or equivalent per week    Comment: Socially   Drug use: No         OPHTHALMIC EXAM:  Base Eye Exam     Visual Acuity (ETDRS)       Right Left   Dist [place] 20/400 20/25 -1   Dist ph [place] 20/200          Tonometry (Tonopen, 8:42 AM)       Right Left   Pressure 12 12         Pupils       Pupils Dark Light Shape APD   Right PERRL 4 3 Round None   Left PERRL 4 3 Round None         Extraocular Movement       Right Left    Full Full         Neuro/Psych     Oriented x3: Yes   Mood/Affect: Normal         Dilation     Right eye: 1.0% Mydriacyl, 2.5% Phenylephrine @ 8:42 AM           Slit Lamp and Fundus Exam     External Exam       Right Left   External Normal          Slit Lamp Exam       Right Left   Lids/Lashes 1+ edema    Conjunctiva/Sclera Injection 2+, well closed, no exposed Vicryl    Cornea Clear    Anterior Chamber Deep and quiet    Iris Round and reactive    Lens Centered posterior chamber intraocular lens    Anterior Vitreous Clear oil          Fundus Exam       Right Left   Posterior Vitreous Clear silicone oil    Disc Normal    C/D Ratio 0.2    Macula Macula attached,  macula clearly seen.  No topographic distortion remains no preretinal fibrosis    Vessels Normal    Periphery Retinopexy surrounding retinotomy inferior,*fold along the inferotemporal posterior slope of the buckle now resolved post vitrectomy yesterday, retina attached, good laser retinopexy in this region             IMAGING AND PROCEDURES  Imaging and Procedures for 07/19/21  Color Fundus Photography  Optos - OU - Both Eyes       Right Eye Disc findings include normal observations. Vessels : normal observations.   Left Eye Progression has been stable. Disc findings include normal observations. Macula : normal observations. Vessels : normal observations. Periphery : normal observations.   Notes Clear oil retina  attached.  Good retinopexy OD, observe, no topographic distortion remains in the macula as compared to preoperative from September or 7 photographs with severe epiretinal membrane at that time now resolved  Clear media, clear oil.  Much less macular topographic distortion.  Macula flat.  Good retinopexy temporally     OCT, Retina - OU - Both Eyes       Right Eye Quality was good. Scan locations included subfoveal. Central Foveal Thickness: 454. Progression has worsened. Findings include epiretinal membrane, cystoid macular edema.   Left Eye Quality was good. Scan locations included subfoveal. Central Foveal Thickness: 317. Progression has been stable. Findings include epiretinal membrane.   Notes OD, Macula attached, much less epiretinal tissue over the macular region.  The macular is now unfolded.    Epiretinal membrane superior temporal to macula OS, with minor topographic changes             ASSESSMENT/PLAN:  Traction retinal detachment involving macula, right OD, overall improved acuity with improved macular topography and OCT thickness post vitrectomy membrane peel for recurrence of epiretinal membrane PVR.  Retina attached today.      ICD-10-CM   1. Epiretinal membrane, right eye  H35.371 Color Fundus Photography Optos - OU - Both Eyes    OCT, Retina - OU - Both Eyes    2. Traction retinal detachment involving macula, right  H33.41       1.  Patient continues on oral vitamin B complex to supplement attrition to the retina  2.  Follow-up in 6 to 7 weeks  3.  Ophthalmic Meds Ordered this visit:  No orders of the defined types were placed in this encounter.      Return in about 7 weeks (around 09/06/2021) for dilate, OD, COLOR FP, OCT.  There are no Patient Instructions on file for this visit.   Explained the diagnoses, plan, and follow up with the patient and they expressed understanding.  Patient expressed understanding of the importance of proper  follow up care.   Clent Demark Xander Jutras M.D. Diseases & Surgery of the Retina and Vitreous Retina & Diabetic [place] 07/19/21     Abbreviations: M myopia (nearsighted); A astigmatism; H hyperopia (farsighted); P presbyopia; Mrx spectacle prescription;  CTL contact lenses; OD right eye; OS left eye; OU both eyes  XT exotropia; ET esotropia; PEK punctate epithelial keratitis; PEE punctate epithelial erosions; DES dry eye syndrome; MGD meibomian gland dysfunction; ATs artificial tears; PFAT's preservative free artificial tears; [place] nuclear sclerotic cataract; PSC posterior subcapsular cataract; ERM epi-retinal membrane; PVD posterior vitreous detachment; RD retinal detachment; DM diabetes mellitus; DR diabetic retinopathy; NPDR non-proliferative diabetic retinopathy; PDR proliferative diabetic retinopathy; CSME clinically significant macular edema; DME diabetic macular edema; dbh dot blot hemorrhages; CWS cotton wool spot; POAG primary open angle glaucoma; C/D cup-to-disc ratio; HVF humphrey visual field; GVF goldmann visual field; OCT optical coherence tomography; IOP intraocular pressure; BRVO Branch retinal vein occlusion; CRVO central retinal vein occlusion; CRAO central retinal artery occlusion; BRAO branch retinal artery occlusion; RT retinal tear; SB scleral buckle; PPV pars plana vitrectomy; VH Vitreous hemorrhage; PRP panretinal laser photocoagulation; IVK intravitreal kenalog; VMT vitreomacular traction; MH Macular hole;  NVD  neovascularization of the disc; NVE neovascularization elsewhere; AREDS age related eye disease study; ARMD age related macular degeneration; POAG primary open angle glaucoma; EBMD epithelial/anterior basement membrane dystrophy; ACIOL anterior chamber intraocular lens; IOL intraocular lens; PCIOL posterior chamber intraocular lens; Phaco/IOL phacoemulsification with intraocular lens placement; [place] photorefractive keratectomy; LASIK laser assisted in situ keratomileusis; HTN  hypertension; DM diabetes mellitus; COPD chronic obstructive pulmonary disease

## 2021-07-19 NOTE — Assessment & Plan Note (Signed)
OD, overall improved acuity with improved macular topography and OCT thickness post vitrectomy membrane peel for recurrence of epiretinal membrane PVR.  Retina attached today.

## 2021-08-18 IMAGING — MG MM DIGITAL SCREENING BILAT W/ TOMO AND CAD
8 series · 8 of 24 positions shown · non-contrast
Comparison: Previous exam(s).

CLINICAL DATA: Screening.

EXAM:
DIGITAL SCREENING BILATERAL MAMMOGRAM WITH TOMOSYNTHESIS AND CAD
TECHNIQUE: Bilateral screening digital craniocaudal and mediolateral oblique
mammograms were obtained. Bilateral screening digital breast
tomosynthesis was performed. The images were evaluated with
computer-aided detection.

[R CC synth-2D]
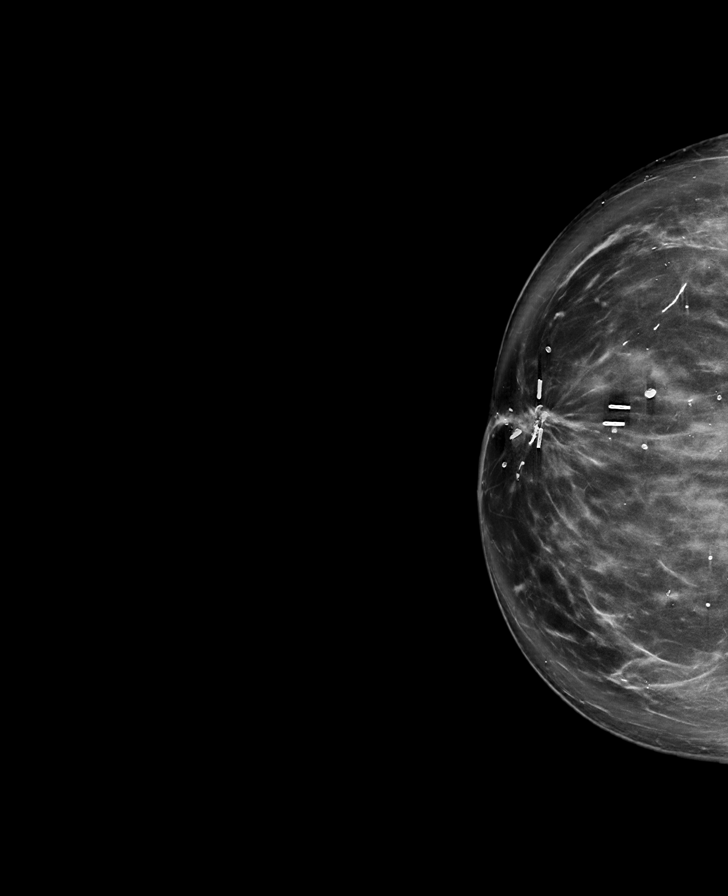

[L CC synth-2D]
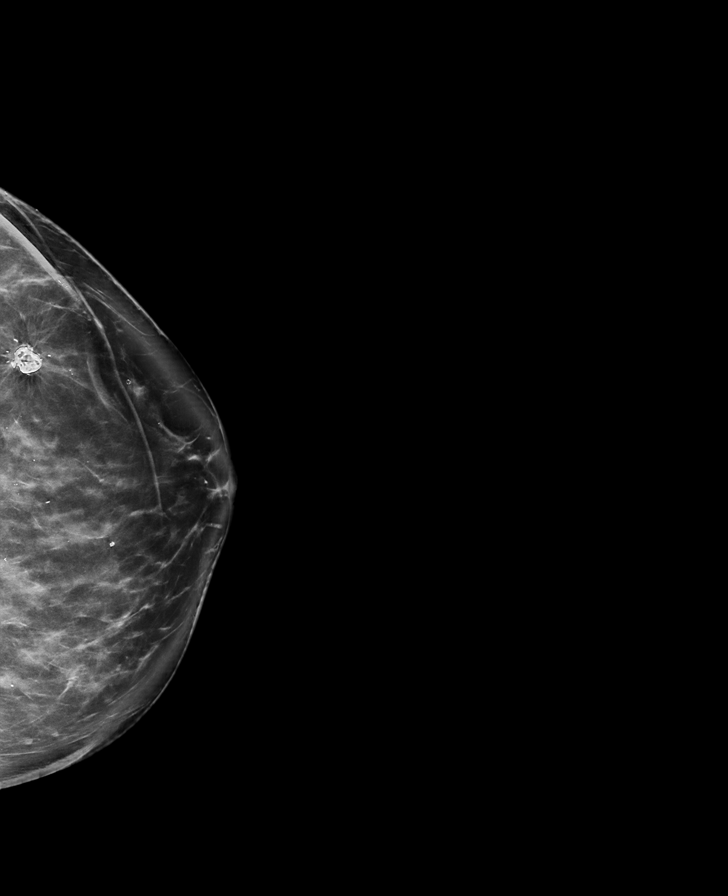

[L MLO synth-2D]
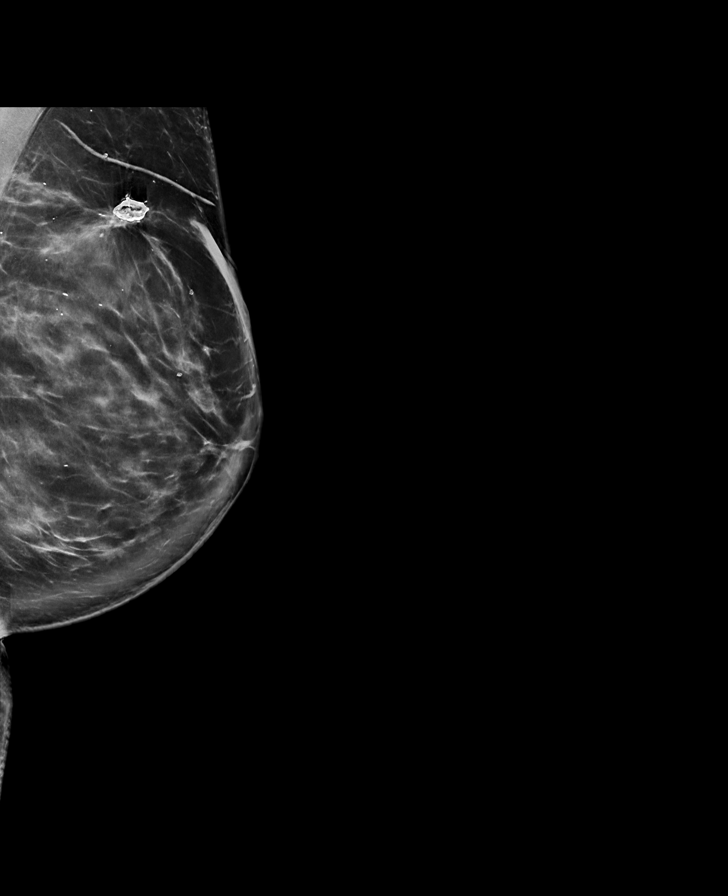

[R MLO synth-2D]
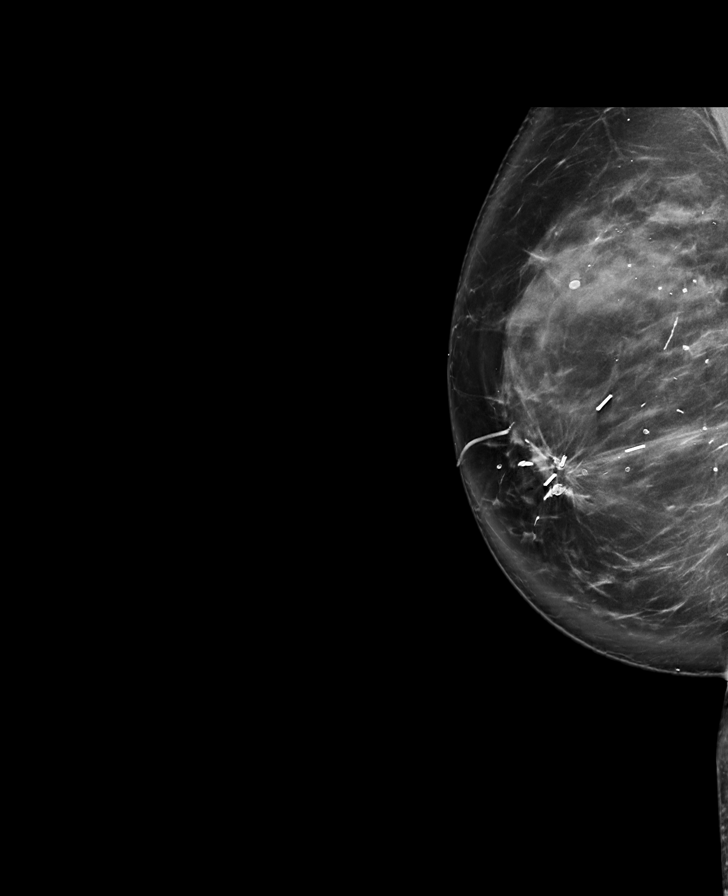

[R MLO tomo · tomo slice 56/111.0]
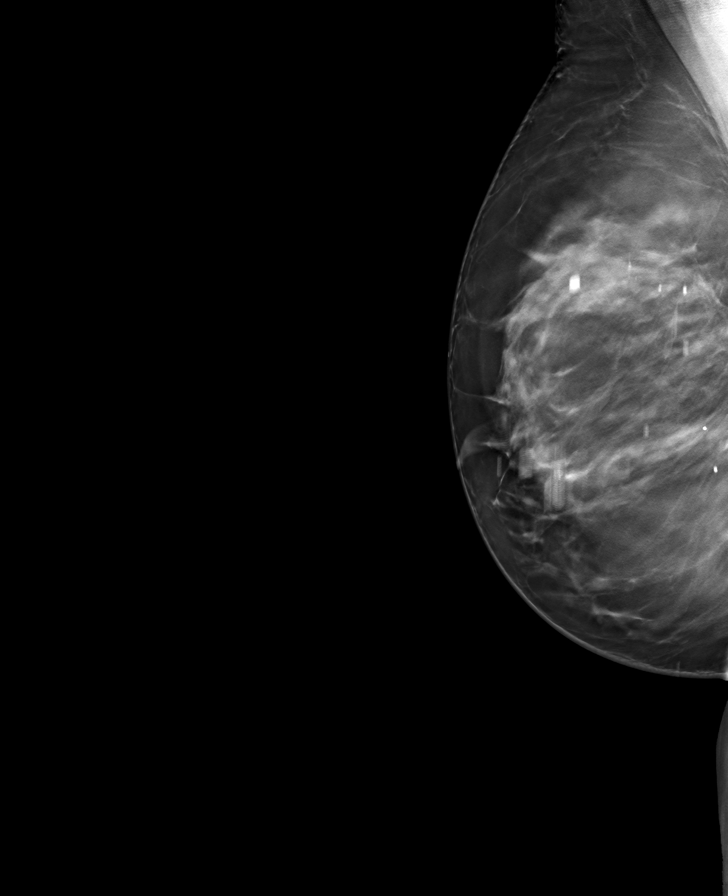

[L MLO tomo · tomo slice 57/112.0]
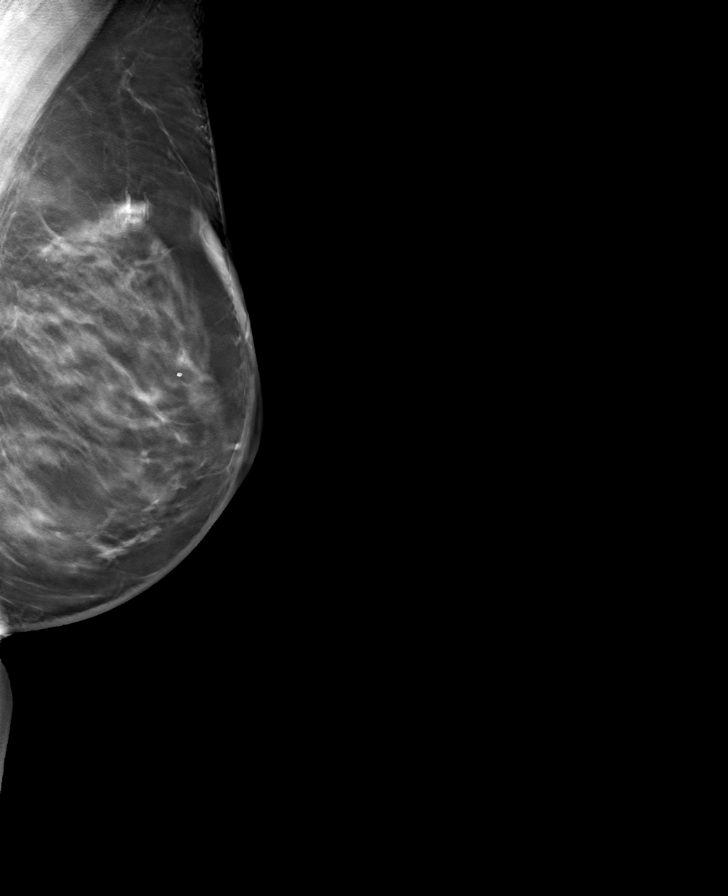

[L CC tomo · tomo slice 51/101.0]
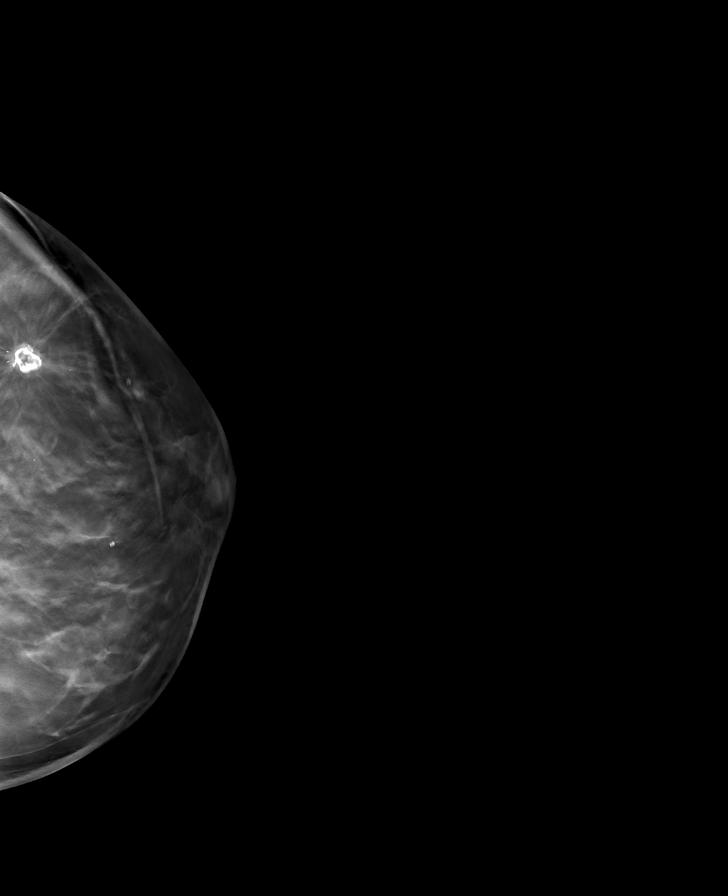

[R CC tomo · tomo slice 54/107.0]
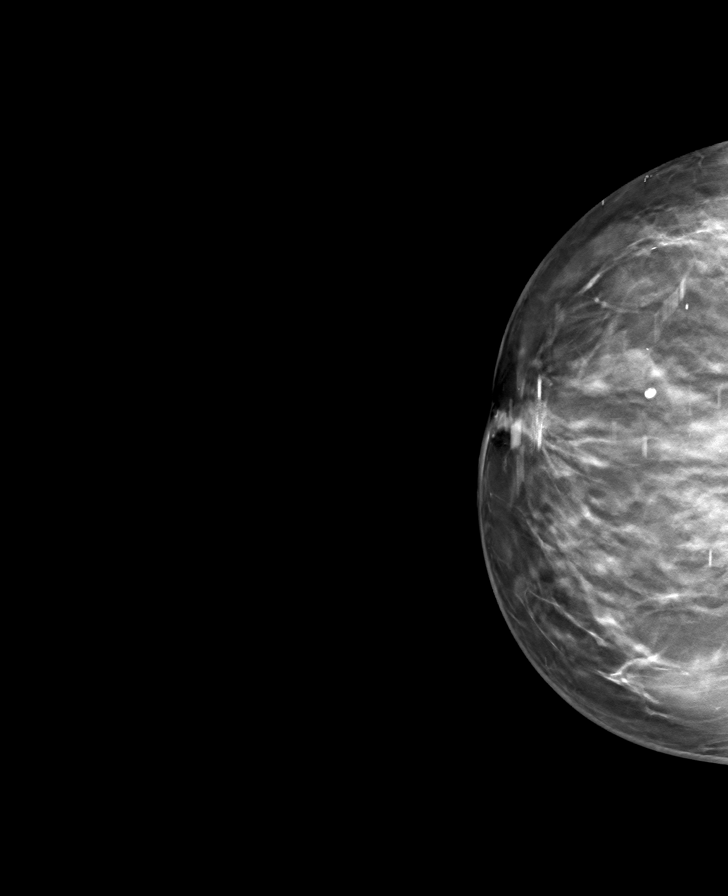

[8 of 24 positions shown; findings below may reference images not displayed]

ACR Breast Density Category c: The breast tissue is heterogeneously
dense, which may obscure small masses.
FINDINGS: There are no findings suspicious for malignancy. The images were
evaluated with computer-aided detection.
IMPRESSION: No mammographic evidence of malignancy. A result letter of this
screening mammogram will be mailed directly to the patient.

RECOMMENDATION:
Screening mammogram in one year. (Code:T4-5-GWO)

BI-RADS CATEGORY  1: Negative.

## 2021-09-06 ENCOUNTER — Ambulatory Visit (INDEPENDENT_AMBULATORY_CARE_PROVIDER_SITE_OTHER): Payer: 59 | Admitting: Ophthalmology

## 2021-09-06 ENCOUNTER — Encounter (INDEPENDENT_AMBULATORY_CARE_PROVIDER_SITE_OTHER): Payer: Self-pay | Admitting: Ophthalmology

## 2021-09-06 ENCOUNTER — Other Ambulatory Visit: Payer: Self-pay

## 2021-09-06 DIAGNOSIS — H33021 Retinal detachment with multiple breaks, right eye: Secondary | ICD-10-CM

## 2021-09-06 DIAGNOSIS — H35371 Puckering of macula, right eye: Secondary | ICD-10-CM | POA: Diagnosis not present

## 2021-09-06 DIAGNOSIS — H3341 Traction detachment of retina, right eye: Secondary | ICD-10-CM

## 2021-09-06 DIAGNOSIS — H35372 Puckering of macula, left eye: Secondary | ICD-10-CM

## 2021-09-06 NOTE — Progress Notes (Signed)
09/06/2021     CHIEF COMPLAINT Patient presents for  Chief Complaint  Patient presents with   Post-op Follow-up      HISTORY OF PRESENT ILLNESS: Valerie Heath is a 70 y.o. female who presents to the clinic today for:   HPI     Post-op Follow-up           Laterality: right eye   Discomfort: Negative for pain, itching, foreign body sensation, tearing and discharge   Vision: is improved         Comments   7 week post op OD and oct and fp- PPV/Membrane Peel OD on 15/40/0867- Presence of silicone oil OD   Pt states, "I feel like my vision has improved significantly. I can still see pretty good up close and no issues that I can see."      Last edited by Kendra Opitz, COA on 09/06/2021  8:22 AM.      Referring physician: Ginger Organ., MD [place],  [place] [id number]  HISTORICAL INFORMATION:   Selected notes from the [place]: No current outpatient medications on file. (Ophthalmic Drugs)   No current facility-administered medications for this visit. (Ophthalmic Drugs)   Current Outpatient Medications (Other)  Medication Sig   b complex vitamins capsule Take 1 capsule by mouth daily.   Cholecalciferol (VITAMIN D) 50 MCG (2000 UT) CAPS Take 1 capsule (2,000 Units total) by mouth daily.   clobetasol ointment (TEMOVATE) 0.05 % Use a pea sized amount topically BID for up to 1-2 weeks as needed for a flare. Not for daily long term use.   levothyroxine (SYNTHROID) 125 MCG tablet Take 1 tablet (125 mcg total) by mouth daily before breakfast.   valACYclovir (VALTREX) 500 MG tablet TAKE 4 TABLETS BY MOUTH ON START OF UCLERATION, THEN 4 TABLETS 12 HOURS LATER   No current facility-administered medications for this visit. (Other)      REVIEW OF SYSTEMS:    ALLERGIES Allergies  Allergen Reactions   Erythromycin Nausea Only    PAST MEDICAL HISTORY Past Medical History:  Diagnosis Date   Breast  cancer ([place])    Breast cancer of upper-outer quadrant of left female breast ([place])    Breast cancer of upper-outer quadrant of right female breast ([place])    Cancer ([place])    breast cancer-lt   Fibroid    Hypothyroid    Personal history of radiation therapy    Posterior vitreous detachment of 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   Radiation 06/16/14-08/01/14   left and right breast    Past Surgical History:  Procedure Laterality Date   BREAST BIOPSY     BREAST LUMPECTOMY Bilateral    2015   BREAST LUMPECTOMY WITH RADIOACTIVE SEED LOCALIZATION Left 02/21/2014   Procedure: BREAST LUMPECTOMY WITH RADIOACTIVE SEED LOCALIZATION;  Surgeon: Adin Hector, MD;  Location: [facility];  Service: General;  Laterality: Left;   BREAST SURGERY Right 04/2014   Lumpectomy    BREAST SURGERY Left    COLONOSCOPY     LYMPH NODE DISSECTION  04/27/14   right and left   MYOMECTOMY     REFRACTIVE SURGERY     X 2   SHOULDER SURGERY  2005   left   TONSILLECTOMY AND ADENOIDECTOMY      FAMILY HISTORY Family History  Problem Relation Age of Onset   Hypertension Mother    Breast cancer Mother 17  Cancer Mother 41       breast cancer at 53 ; Fallopian tube tumor in her 8s   Diabetes Father    Hypertension Father    Heart disease Father    Prostate cancer Father    Cancer Father 59       prostate cancer   Colon cancer Paternal Uncle    Cancer Paternal Uncle 8       colon cancer   Throat cancer Maternal Uncle    Cancer Maternal Uncle 45       prostate cancer   Prostate cancer Maternal Uncle    Breast cancer Maternal Aunt 97   Leukemia Paternal Grandfather    Leukemia Maternal Aunt    Cancer Paternal Uncle 37       brain cancer   Brain cancer Paternal Uncle     SOCIAL HISTORY Social History   Tobacco Use   Smoking status: Never   Smokeless tobacco: Never  Vaping Use   Vaping Use: Never used  Substance Use Topics   Alcohol use: Yes    Alcohol/week: 2.0 standard drinks    Types: 2 Standard  drinks or equivalent per week    Comment: Socially   Drug use: No         OPHTHALMIC EXAM:  Base Eye Exam     Visual Acuity (ETDRS)       Right Left   Dist [place] CF at 3' 20/20 +3   Dist ph [place] 20/150 -1          Tonometry (Tonopen, 8:17 AM)       Right Left   Pressure 13 12         Pupils       Pupils Shape React APD   Right PERRL Round Brisk None   Left PERRL Round Brisk None         Visual Fields (Counting fingers)       Left Right    Full Full         Extraocular Movement       Right Left    Full, Ortho Full, Ortho         Neuro/Psych     Oriented x3: Yes   Mood/Affect: Normal         Dilation     Right eye: 1.0% Mydriacyl, 2.5% Phenylephrine @ 8:17 AM           Slit Lamp and Fundus Exam     External Exam       Right Left   External Normal          Slit Lamp Exam       Right Left   Lids/Lashes 1+ edema    Conjunctiva/Sclera Injection 2+, well closed, no exposed Vicryl    Cornea Clear    Anterior Chamber Deep and quiet    Iris Round and reactive    Lens Centered posterior chamber intraocular lens    Anterior Vitreous Clear oil          Fundus Exam       Right Left   Posterior Vitreous Clear silicone oil    Disc Normal    C/D Ratio 0.2    Macula Macula attached,  macula clearly seen.  No topographic distortion remains no preretinal fibrosis    Vessels Normal    Periphery Retinopexy surrounding retinotomy inferior,*fold along the inferotemporal posterior slope of the buckle now resolved post vitrectomy yesterday, retina attached, good laser retinopexy in  this region             IMAGING AND PROCEDURES  Imaging and Procedures for 09/06/21  OCT, Retina - OU - Both Eyes       Right Eye Quality was good. Scan locations included subfoveal. Central Foveal Thickness: 478. Progression has worsened. Findings include epiretinal membrane, cystoid macular edema.   Left Eye Quality was good. Scan locations  included subfoveal. Central Foveal Thickness: 317. Progression has been stable. Findings include epiretinal membrane.   Notes OD, Macula attached, much less epiretinal tissue over the macular region.  The macular is now unfolded.    Epiretinal membrane superior temporal to macula OS, with minor topographic changes, no distortion to fovea     Color Fundus Photography Optos - OU - Both Eyes       Right Eye Disc findings include normal observations. Vessels : normal observations.   Left Eye Progression has been stable. Disc findings include normal observations. Macula : epiretinal membrane. Vessels : normal observations. Periphery : normal observations.   Notes Clear oil retina attached.  Good retinopexy OD, observe, no topographic distortion remains in the macula as compared to preoperative from September or 7 photographs with severe epiretinal membrane at that time now resolved  Clear media, clear oil.  Much less macular topographic distortion.  Macula flat.  Good retinopexy temporally  OS with minor epiretinal membrane superior portion of macula, none foveal distorting.  Incidental PVD OS             ASSESSMENT/PLAN:  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 OD now with retinal reattachment stabilized, post vitrectomy membrane peel resection of Traction retinal detachment involving the macula looks great with oil in place  Traction retinal detachment involving macula, right Now some 3 months status post vitrectomy membrane peel resection of severe epiretinal membrane under the oil on 06/06/2021.  Macula reattached, OCT with residual CME yet no subretinal fluid  Left epiretinal membrane Minor non foveal distorting, will observe     ICD-10-CM   1. Epiretinal membrane, right eye  H35.371 OCT, Retina - OU - Both Eyes    Color Fundus Photography Optos - OU - Both Eyes    2. 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  H33.021     3. Traction retinal  detachment involving macula, right  H33.41     4. Left epiretinal membrane  H35.372       1.  OD looks great, macula reattached.  Epiretinal membrane removed some 3 months previous under the oil.  Retina attached we will plan on scheduling vitrectomy and oil removal right eye after the holidays  2.  OS discussion, no impact on acuity observe  3.  Ophthalmic Meds Ordered this visit:  No orders of the defined types were placed in this encounter.      Return in about 4 weeks (around 10/04/2021) for COLOR FP, OD, OCT,,, plan for surgery, Vit oil removal OD.  There are no Patient Instructions on file for this visit.   Explained the diagnoses, plan, and follow up with the patient and they expressed understanding.  Patient expressed understanding of the importance of proper follow up care.   Clent Demark Wilhelmina Hark M.D. Diseases & Surgery of the Retina and Vitreous Retina & Diabetic [place] 09/06/21     Abbreviations: M myopia (nearsighted); A astigmatism; H hyperopia (farsighted); P presbyopia; Mrx spectacle prescription;  CTL contact lenses; OD right eye; OS left eye; OU both eyes  XT  exotropia; ET esotropia; PEK punctate epithelial keratitis; PEE punctate epithelial erosions; DES dry eye syndrome; MGD meibomian gland dysfunction; ATs artificial tears; PFAT's preservative free artificial tears; [place] nuclear sclerotic cataract; PSC posterior subcapsular cataract; ERM epi-retinal membrane; PVD posterior vitreous detachment; RD retinal detachment; DM diabetes mellitus; DR diabetic retinopathy; NPDR non-proliferative diabetic retinopathy; PDR proliferative diabetic retinopathy; CSME clinically significant macular edema; DME diabetic macular edema; dbh dot blot hemorrhages; CWS cotton wool spot; POAG primary open angle glaucoma; C/D cup-to-disc ratio; HVF humphrey visual field; GVF goldmann visual field; OCT optical coherence tomography; IOP intraocular pressure; BRVO Branch retinal vein occlusion;  CRVO central retinal vein occlusion; CRAO central retinal artery occlusion; BRAO branch retinal artery occlusion; RT retinal tear; SB scleral buckle; PPV pars plana vitrectomy; VH Vitreous hemorrhage; PRP panretinal laser photocoagulation; IVK intravitreal kenalog; VMT vitreomacular traction; MH Macular hole;  NVD neovascularization of the disc; NVE neovascularization elsewhere; AREDS age related eye disease study; ARMD age related macular degeneration; POAG primary open angle glaucoma; EBMD epithelial/anterior basement membrane dystrophy; ACIOL anterior chamber intraocular lens; IOL intraocular lens; PCIOL posterior chamber intraocular lens; Phaco/IOL phacoemulsification with intraocular lens placement; [place] photorefractive keratectomy; LASIK laser assisted in situ keratomileusis; HTN hypertension; DM diabetes mellitus; COPD chronic obstructive pulmonary disease

## 2021-09-06 NOTE — Assessment & Plan Note (Signed)
OD now with retinal reattachment stabilized, post vitrectomy membrane peel resection of Traction retinal detachment involving the macula looks great with oil in place

## 2021-09-06 NOTE — Assessment & Plan Note (Signed)
Minor non foveal distorting, will observe

## 2021-09-06 NOTE — Assessment & Plan Note (Signed)
Now some 3 months status post vitrectomy membrane peel resection of severe epiretinal membrane under the oil on 06/06/2021.  Macula reattached, OCT with residual CME yet no subretinal fluid

## 2021-10-03 ENCOUNTER — Other Ambulatory Visit: Payer: Self-pay

## 2021-10-03 ENCOUNTER — Encounter (INDEPENDENT_AMBULATORY_CARE_PROVIDER_SITE_OTHER): Payer: Self-pay | Admitting: Ophthalmology

## 2021-10-03 ENCOUNTER — Ambulatory Visit (INDEPENDENT_AMBULATORY_CARE_PROVIDER_SITE_OTHER): Payer: 59 | Admitting: Ophthalmology

## 2021-10-03 DIAGNOSIS — H3341 Traction detachment of retina, right eye: Secondary | ICD-10-CM | POA: Diagnosis not present

## 2021-10-03 NOTE — Progress Notes (Signed)
10/03/2021     CHIEF COMPLAINT Patient presents for  Chief Complaint  Patient presents with   Retina Follow Up      HISTORY OF PRESENT ILLNESS: Valerie Heath is a 71 y.o. female who presents to the clinic today for:   HPI     Retina Follow Up           Diagnosis: Other   Laterality: right eye   Onset: 4 weeks ago   Severity: mild   Duration: 4 weeks   Course: stable         Comments   4 week fu OD, FP/OCT/ plan surgery for vit oil removal OD   Pt states VA OU stable since last visit. Pt denies FOL, floaters, or ocular pain OU.   Pt states, "things seem to be a little better. Even with that oil in my eye I feel like I can see a little better."         Last edited by Kendra Opitz, COA on 10/03/2021  8:24 AM.      Referring physician: Ginger Organ., MD [place],  [place] [address]  HISTORICAL INFORMATION:   Selected notes from the [facility]: No current outpatient medications on file. (Ophthalmic Drugs)   No current facility-administered medications for this visit. (Ophthalmic Drugs)   Current Outpatient Medications (Other)  Medication Sig   b complex vitamins capsule Take 1 capsule by mouth daily.   Cholecalciferol (VITAMIN D) 50 MCG (2000 UT) CAPS Take 1 capsule (2,000 Units total) by mouth daily.   clobetasol ointment (TEMOVATE) 0.05 % Use a pea sized amount topically BID for up to 1-2 weeks as needed for a flare. Not for daily long term use.   levothyroxine (SYNTHROID) 125 MCG tablet Take 1 tablet (125 mcg total) by mouth daily before breakfast.   valACYclovir (VALTREX) 500 MG tablet TAKE 4 TABLETS BY MOUTH ON START OF UCLERATION, THEN 4 TABLETS 12 HOURS LATER   No current facility-administered medications for this visit. (Other)      REVIEW OF SYSTEMS: ROS   Negative for: Constitutional, Gastrointestinal, Neurological, Skin, Genitourinary, Musculoskeletal, HENT, Endocrine,  Cardiovascular, Eyes, Respiratory, Psychiatric, Allergic/Imm, Heme/Lymph Last edited by Hurman Horn, MD on 10/03/2021  8:57 AM.       ALLERGIES Allergies  Allergen Reactions   Erythromycin Nausea Only    PAST MEDICAL HISTORY Past Medical History:  Diagnosis Date   Breast cancer ([facility])    Breast cancer of upper-outer quadrant of left female breast ([facility])    Breast cancer of upper-outer quadrant of right female breast ([facility])    Cancer ([facility])    breast cancer-lt   Fibroid    Hypothyroid    Personal history of radiation therapy    Posterior vitreous detachment of 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   Radiation 06/16/14-08/01/14   left and right breast    Past Surgical History:  Procedure Laterality Date   BREAST BIOPSY     BREAST LUMPECTOMY Bilateral    2015   BREAST LUMPECTOMY WITH RADIOACTIVE SEED LOCALIZATION Left 02/21/2014   Procedure: BREAST LUMPECTOMY WITH RADIOACTIVE SEED LOCALIZATION;  Surgeon: Adin Hector, MD;  Location: [facility];  Service: General;  Laterality: Left;   BREAST SURGERY Right 04/2014   Lumpectomy    BREAST SURGERY Left    COLONOSCOPY     LYMPH NODE DISSECTION  04/27/14   right and left  MYOMECTOMY     REFRACTIVE SURGERY     X 2   SHOULDER SURGERY  2005   left   TONSILLECTOMY AND ADENOIDECTOMY      FAMILY HISTORY Family History  Problem Relation Age of Onset   Hypertension Mother    Breast cancer Mother 4   Cancer Mother 11       breast cancer at 38 ; Fallopian tube tumor in her 22s   Diabetes Father    Hypertension Father    Heart disease Father    Prostate cancer Father    Cancer Father 54       prostate cancer   Colon cancer Paternal Uncle    Cancer Paternal Uncle 57       colon cancer   Throat cancer Maternal Uncle    Cancer Maternal Uncle 45       prostate cancer   Prostate cancer Maternal Uncle    Breast cancer Maternal Aunt 14   Leukemia Paternal Grandfather    Leukemia Maternal Aunt    Cancer Paternal Uncle 36        brain cancer   Brain cancer Paternal Uncle     SOCIAL HISTORY Social History   Tobacco Use   Smoking status: Never   Smokeless tobacco: Never  Vaping Use   Vaping Use: Never used  Substance Use Topics   Alcohol use: Yes    Alcohol/week: 2.0 standard drinks    Types: 2 Standard drinks or equivalent per week    Comment: Socially   Drug use: No         OPHTHALMIC EXAM:  Base Eye Exam     Visual Acuity (ETDRS)       Right Left   Dist [place] 20/200 20/20 -1   Dist ph [place] 20/125          Tonometry (Tonopen, 8:27 AM)       Right Left   Pressure 12 12         Pupils       Pupils APD   Right PERRL None   Left PERRL None         Visual Fields (Counting fingers)       Left Right    Full Full         Extraocular Movement       Right Left    Full, Ortho Full, Ortho         Neuro/Psych     Oriented x3: Yes   Mood/Affect: Normal         Dilation     Right eye: 1.0% Mydriacyl, 2.5% Phenylephrine @ 8:27 AM           Slit Lamp and Fundus Exam     External Exam       Right Left   External Normal          Slit Lamp Exam       Right Left   Lids/Lashes 1+ edema    Conjunctiva/Sclera Injection 2+, well closed, no exposed Vicryl    Cornea Clear    Anterior Chamber Deep and quiet    [place]ris Round and reactive    Lens Centered posterior chamber intraocular lens    Anterior Vitreous Clear oil          Fundus Exam       Right Left   Posterior Vitreous Clear silicone oil    Disc Normal    C/D Ratio 0.2    Macula  Macula attached, macula clearly seen.  No topographic distortion remains no preretinal fibrosis    Vessels Normal    Periphery Retinopexy surrounding retinotomy inferior,*fold along the inferotemporal posterior slope of the buckle now resolved post vitrectomy yesterday, retina attached, good laser retinopexy in this region             IMAGING AND PROCEDURES  Imaging and Procedures for 10/03/21  OCT, Retina - OU - Both  Eyes       Right Eye Quality was good. Scan locations included subfoveal. Central Foveal Thickness: 483. Progression has worsened. Findings include epiretinal membrane, cystoid macular edema.   Left Eye Quality was good. Scan locations included subfoveal. Central Foveal Thickness: 325. Progression has been stable. Findings include epiretinal membrane.   Notes OD, Macula attached, much less epiretinal tissue over the macular region.  The macular is now unfolded.    Epiretinal membrane superior temporal to macula OS, with minor topographic changes, no distortion to fovea     Color Fundus Photography Optos - OU - Both Eyes       Right Eye Disc findings include normal observations. Vessels : normal observations.   Left Eye Progression has been stable. Disc findings include normal observations. Macula : epiretinal membrane. Vessels : normal observations. Periphery : normal observations.   Notes Clear oil retina attached.  Good retinopexy OD, observe, no topographic distortion remains in the macula as compared to preoperative from September or 7 photographs with severe epiretinal membrane at that time now resolved, no regrowth   Clear media, clear oil.  Much less macular topographic distortion.  Macula flat.  Good retinopexy temporally  OS with minor epiretinal membrane superior portion of macula, none foveal distorting.  Incidental PVD OS             ASSESSMENT/PLAN:  Traction retinal detachment involving macula, right History of complex PVR, with retinal detachment subsequent tractional detachment and subsequent epiretinal membrane formation.  Now with macular flat.  Will plan on vitrectomy with silicone oil removal right eye, endolaser retinopexy if required temporally and in other areas of previous chorioretinal scarring, possible epiretinal membrane removal should TissueBlue highlight residual ILM changes, at the time of surgery     ICD-10-CM   1. Traction retinal  detachment involving macula, right  H33.41 OCT, Retina - OU - Both Eyes    Color Fundus Photography Optos - OU - Both Eyes      1.  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  Now with retained silicone oil some 4 months after most recent surgery.  Will need surgical intervention to include silicone oil removal  2.  Surgery planned for late February 2023  3.  Ophthalmic Meds Ordered this visit:  No orders of the defined types were placed in this encounter.      Return , [facility], for Schedule vitrectomy, silicone oil removal, endolaser, epiretinal membrane removal  TissueBlue.  There are no Patient Instructions on file for this visit.   Explained the diagnoses, plan, and follow up with the patient and they expressed understanding.  Patient expressed understanding of the importance of proper follow up care.   Clent Demark Siddarth Hsiung M.D. Diseases & Surgery of the Retina and Vitreous [facility] 10/03/21     Abbreviations: M myopia (nearsighted); A astigmatism; H hyperopia (farsighted); P presbyopia; Mrx spectacle prescription;  CTL contact lenses; OD right eye; OS left eye; OU both eyes  XT  exotropia; ET esotropia; PEK punctate epithelial keratitis; PEE punctate epithelial erosions; DES dry eye syndrome; MGD meibomian gland dysfunction; ATs artificial tears; PFAT's preservative free artificial tears; [place] nuclear sclerotic cataract; PSC posterior subcapsular cataract; ERM epi-retinal membrane; PVD posterior vitreous detachment; RD retinal detachment; DM diabetes mellitus; DR diabetic retinopathy; NPDR non-proliferative diabetic retinopathy; PDR proliferative diabetic retinopathy; CSME clinically significant macular edema; DME diabetic macular edema; dbh dot blot hemorrhages; CWS cotton wool spot; POAG primary open angle glaucoma; C/D cup-to-disc ratio; HVF humphrey visual field; GVF goldmann  visual field; OCT optical coherence tomography; IOP intraocular pressure; BRVO Branch retinal vein occlusion; CRVO central retinal vein occlusion; CRAO central retinal artery occlusion; BRAO branch retinal artery occlusion; RT retinal tear; SB scleral buckle; PPV pars plana vitrectomy; VH Vitreous hemorrhage; PRP panretinal laser photocoagulation; IVK intravitreal kenalog; VMT vitreomacular traction; MH Macular hole;  NVD neovascularization of the disc; NVE neovascularization elsewhere; AREDS age related eye disease study; ARMD age related macular degeneration; POAG primary open angle glaucoma; EBMD epithelial/anterior basement membrane dystrophy; ACIOL anterior chamber intraocular lens; IOL intraocular lens; PCIOL posterior chamber intraocular lens; Phaco/IOL phacoemulsification with intraocular lens placement; [place] photorefractive keratectomy; LASIK laser assisted in situ keratomileusis; HTN hypertension; DM diabetes mellitus; COPD chronic obstructive pulmonary disease

## 2021-10-03 NOTE — Assessment & Plan Note (Signed)
History of complex PVR, with retinal detachment subsequent tractional detachment and subsequent epiretinal membrane formation.  Now with macular flat.  Will plan on vitrectomy with silicone oil removal right eye, endolaser retinopexy if required temporally and in other areas of previous chorioretinal scarring, possible epiretinal membrane removal should TissueBlue highlight residual ILM changes, at the time of surgery

## 2021-10-25 ENCOUNTER — Encounter (INDEPENDENT_AMBULATORY_CARE_PROVIDER_SITE_OTHER): Payer: 59 | Admitting: Ophthalmology

## 2021-10-31 ENCOUNTER — Encounter (INDEPENDENT_AMBULATORY_CARE_PROVIDER_SITE_OTHER): Payer: Self-pay

## 2021-10-31 ENCOUNTER — Ambulatory Visit (INDEPENDENT_AMBULATORY_CARE_PROVIDER_SITE_OTHER): Payer: 59

## 2021-10-31 ENCOUNTER — Other Ambulatory Visit: Payer: Self-pay

## 2021-10-31 DIAGNOSIS — H3341 Traction detachment of retina, right eye: Secondary | ICD-10-CM

## 2021-10-31 MED ORDER — OFLOXACIN 0.3 % OP SOLN: 1.0000 [drp] | Freq: Four times a day (QID) | OPHTHALMIC | 0 refills | Status: AC

## 2021-10-31 MED ORDER — PREDNISOLONE ACETATE 1 % OP SUSP: 1.0000 [drp] | Freq: Four times a day (QID) | OPHTHALMIC | 0 refills | Status: AC

## 2021-10-31 NOTE — Progress Notes (Signed)
10/31/2021     CHIEF COMPLAINT Patient presents for Pre-op Exam   HISTORY OF PRESENT ILLNESS: Valerie Heath is a 71 y.o. female who presents to the clinic today for:   HPI   Pre op OD sx 11/14/2021. Patient states vision is stable and unchanged since last visit. Denies any new floaters or FOL.  Last edited by Laurin Coder on 10/31/2021  1:24 PM.        HISTORICAL INFORMATION:   Selected notes from the [facility]: No current outpatient medications on file. (Ophthalmic Drugs)   No current facility-administered medications for this visit. (Ophthalmic Drugs)   Current Outpatient Medications (Other)  Medication Sig   b complex vitamins capsule Take 1 capsule by mouth daily.   Cholecalciferol (VITAMIN D) 50 MCG (2000 UT) CAPS Take 1 capsule (2,000 Units total) by mouth daily.   clobetasol ointment (TEMOVATE) 0.05 % Use a pea sized amount topically BID for up to 1-2 weeks as needed for a flare. Not for daily long term use.   levothyroxine (SYNTHROID) 125 MCG tablet Take 1 tablet (125 mcg total) by mouth daily before breakfast.   valACYclovir (VALTREX) 500 MG tablet TAKE 4 TABLETS BY MOUTH ON START OF UCLERATION, THEN 4 TABLETS 12 HOURS LATER   No current facility-administered medications for this visit. (Other)     ALLERGIES Allergies  Allergen Reactions   Erythromycin Nausea Only    PAST MEDICAL HISTORY Past Medical History:  Diagnosis Date   Breast cancer ([facility])    Breast cancer of upper-outer quadrant of left female breast ([facility])    Breast cancer of upper-outer quadrant of right female breast ([facility])    Cancer ([facility])    breast cancer-lt   Fibroid    Hypothyroid    Personal history of radiation therapy    Posterior vitreous detachment of 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   Radiation 06/16/14-08/01/14   left and right breast    Past Surgical History:  Procedure Laterality Date   BREAST BIOPSY     BREAST LUMPECTOMY Bilateral    2015    BREAST LUMPECTOMY WITH RADIOACTIVE SEED LOCALIZATION Left 02/21/2014   Procedure: BREAST LUMPECTOMY WITH RADIOACTIVE SEED LOCALIZATION;  Surgeon: Adin Hector, MD;  Location: [facility];  Service: General;  Laterality: Left;   BREAST SURGERY Right 04/2014   Lumpectomy    BREAST SURGERY Left    COLONOSCOPY     LYMPH NODE DISSECTION  04/27/14   right and left   MYOMECTOMY     REFRACTIVE SURGERY     X 2   SHOULDER SURGERY  2005   left   TONSILLECTOMY AND ADENOIDECTOMY      FAMILY HISTORY Family History  Problem Relation Age of Onset   Hypertension Mother    Breast cancer Mother 73   Cancer Mother 81       breast cancer at 17 ; Fallopian tube tumor in her 37s   Diabetes Father    Hypertension Father    Heart disease Father    Prostate cancer Father    Cancer Father 43       prostate cancer   Colon cancer Paternal Uncle    Cancer Paternal Uncle 74       colon cancer   Throat cancer Maternal Uncle    Cancer Maternal Uncle 45       prostate cancer   Prostate cancer Maternal Uncle    Breast  cancer Maternal Aunt 31   Leukemia Paternal Grandfather    Leukemia Maternal Aunt    Cancer Paternal Uncle 38       brain cancer   Brain cancer Paternal Uncle     SOCIAL HISTORY Social History   Tobacco Use   Smoking status: Never   Smokeless tobacco: Never  Vaping Use   Vaping Use: Never used  Substance Use Topics   Alcohol use: Yes    Alcohol/week: 2.0 standard drinks    Types: 2 Standard drinks or equivalent per week    Comment: Socially   Drug use: No         OPHTHALMIC EXAM:  Base Eye Exam     Visual Acuity (ETDRS)       Right Left   Dist [place] 20/200 20/20   Dist ph [place] NI          Tonometry (Tonopen, 1:27 PM)       Right Left   Pressure 5          Pupils       Pupils APD   Right PERRL None   Left PERRL None         Extraocular Movement       Right Left    Full Full         Neuro/Psych     Oriented x3: Yes    Mood/Affect: Normal         Dilation     Both eyes: No dilation @ 1:25 PM            IMAGING AND PROCEDURES  Imaging and Procedures for @TODAY @           ASSESSMENT/PLAN:  No diagnosis found.  Ophthalmic Meds Ordered this visit:  No orders of the defined types were placed in this encounter.       Pre-op completed. Operative consent obtained with pre-op eye drops reviewed with Truitt Leep and sent via [facility] as needed. Post op instructions reviewed with patient and per patient all questions answered.  Laurin Coder

## 2021-11-13 ENCOUNTER — Encounter (INDEPENDENT_AMBULATORY_CARE_PROVIDER_SITE_OTHER): Payer: 59 | Admitting: Ophthalmology

## 2021-11-14 ENCOUNTER — Encounter (AMBULATORY_SURGERY_CENTER): Payer: 59 | Admitting: Ophthalmology

## 2021-11-14 DIAGNOSIS — T85398A Other mechanical complication of other ocular prosthetic devices, implants and grafts, initial encounter: Secondary | ICD-10-CM | POA: Diagnosis not present

## 2021-11-15 ENCOUNTER — Other Ambulatory Visit: Payer: Self-pay

## 2021-11-15 ENCOUNTER — Ambulatory Visit (INDEPENDENT_AMBULATORY_CARE_PROVIDER_SITE_OTHER): Payer: 59 | Admitting: Ophthalmology

## 2021-11-15 ENCOUNTER — Encounter (INDEPENDENT_AMBULATORY_CARE_PROVIDER_SITE_OTHER): Payer: Self-pay | Admitting: Ophthalmology

## 2021-11-15 DIAGNOSIS — H35351 Cystoid macular degeneration, right eye: Secondary | ICD-10-CM

## 2021-11-15 DIAGNOSIS — H3341 Traction detachment of retina, right eye: Secondary | ICD-10-CM

## 2021-11-15 NOTE — Assessment & Plan Note (Addendum)
Post removal of silicone oil, macular remained attached.  Acuity postop day #1 improved now to 20/70 - best corrected via pinhole

## 2021-11-15 NOTE — Patient Instructions (Signed)

## 2021-11-15 NOTE — Progress Notes (Signed)
11/15/2021     CHIEF COMPLAINT Patient presents for  Chief Complaint  Patient presents with   Post-op Follow-up      HISTORY OF PRESENT ILLNESS: Valerie Heath is a 71 y.o. female who presents to the clinic today for:   HPI     Post-op Follow-up           Laterality: right eye         Comments   Postop day #1, status post vitrectomy, removal of silicone oil implant, laser retinopexy and instillation of small amount of intravitreal Kenalog to control the underlying cystoid macular edema.  No pain or discomfort to speak of last night no weeping from the eye.      Last edited by Hurman Horn, MD on 11/15/2021  8:21 AM.      Referring physician: Ginger Organ., MD [place],  [place] [address]  HISTORICAL INFORMATION:   Selected notes from the MEDICAL RECORD NUMBER       CURRENT MEDICATIONS: No current outpatient medications on file. (Ophthalmic Drugs)   No current facility-administered medications for this visit. (Ophthalmic Drugs)   Current Outpatient Medications (Other)  Medication Sig   b complex vitamins capsule Take 1 capsule by mouth daily.   Cholecalciferol (VITAMIN D) 50 MCG (2000 UT) CAPS Take 1 capsule (2,000 Units total) by mouth daily.   clobetasol ointment (TEMOVATE) 0.05 % Use a pea sized amount topically BID for up to 1-2 weeks as needed for a flare. Not for daily long term use.   levothyroxine (SYNTHROID) 125 MCG tablet Take 1 tablet (125 mcg total) by mouth daily before breakfast.   valACYclovir (VALTREX) 500 MG tablet TAKE 4 TABLETS BY MOUTH ON START OF UCLERATION, THEN 4 TABLETS 12 HOURS LATER   No current facility-administered medications for this visit. (Other)      REVIEW OF SYSTEMS: ROS   Negative for: Constitutional, Gastrointestinal, Neurological, Skin, Genitourinary, Musculoskeletal, HENT, Endocrine, Cardiovascular, Eyes, Respiratory, Psychiatric, Allergic/Imm, Heme/Lymph Last edited by Hurman Horn, MD on  11/15/2021  8:21 AM.       ALLERGIES Allergies  Allergen Reactions   Erythromycin Nausea Only    PAST MEDICAL HISTORY Past Medical History:  Diagnosis Date   Breast cancer ([facility])    Breast cancer of upper-outer quadrant of left female breast ([facility])    Breast cancer of upper-outer quadrant of right female breast ([facility])    Cancer ([facility])    breast cancer-lt   Fibroid    Hypothyroid    Personal history of radiation therapy    Posterior vitreous detachment of 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   Radiation 06/16/14-08/01/14   left and right breast    Past Surgical History:  Procedure Laterality Date   BREAST BIOPSY     BREAST LUMPECTOMY Bilateral    2015   BREAST LUMPECTOMY WITH RADIOACTIVE SEED LOCALIZATION Left 02/21/2014   Procedure: BREAST LUMPECTOMY WITH RADIOACTIVE SEED LOCALIZATION;  Surgeon: Adin Hector, MD;  Location: [facility];  Service: General;  Laterality: Left;   BREAST SURGERY Right 04/2014   Lumpectomy    BREAST SURGERY Left    COLONOSCOPY     LYMPH NODE DISSECTION  04/27/14   right and left   MYOMECTOMY     REFRACTIVE SURGERY     X 2   SHOULDER SURGERY  2005   left   TONSILLECTOMY AND ADENOIDECTOMY      FAMILY HISTORY Family History  Problem Relation Age of  Onset   Hypertension Mother    Breast cancer Mother 68   Cancer Mother 20       breast cancer at 44 ; Fallopian tube tumor in her 33s   Diabetes Father    Hypertension Father    Heart disease Father    Prostate cancer Father    Cancer Father 16       prostate cancer   Colon cancer Paternal Uncle    Cancer Paternal Uncle 73       colon cancer   Throat cancer Maternal Uncle    Cancer Maternal Uncle 45       prostate cancer   Prostate cancer Maternal Uncle    Breast cancer Maternal Aunt 30   Leukemia Paternal Grandfather    Leukemia Maternal Aunt    Cancer Paternal Uncle 34       brain cancer   Brain cancer Paternal Uncle     SOCIAL HISTORY Social History   Tobacco Use   Smoking  status: Never   Smokeless tobacco: Never  Vaping Use   Vaping Use: Never used  Substance Use Topics   Alcohol use: Yes    Alcohol/week: 2.0 standard drinks    Types: 2 Standard drinks or equivalent per week    Comment: Socially   Drug use: No         OPHTHALMIC EXAM:  Base Eye Exam     Visual Acuity (ETDRS)       Right Left   Dist [place] 20/150 20/20   Dist ph [place] 20/80 -2          Tonometry (Tonopen, 8:21 AM)       Right Left   Pressure 8 12         Neuro/Psych     Oriented x3: Yes   Mood/Affect: Normal           Slit Lamp and Fundus Exam     External Exam       Right Left   External Normal          Slit Lamp Exam       Right Left   Lids/Lashes 1+ edema    Conjunctiva/Sclera 2+ Injection    Cornea Clear    Anterior Chamber Deep and quiet    Iris Round and reactive    Lens Centered posterior chamber intraocular lens    Anterior Vitreous Clear          Fundus Exam       Right Left   Posterior Vitreous Clear, avitric    Disc Normal    C/D Ratio 0.2    Macula Macula attached, macula clearly seen.  No topographic distortion remains no preretinal fibrosis    Vessels Normal    Periphery Retinopexy surrounding retinotomy inferior, retina attached, good laser retinopexy demarcates each of the previous chorioretinal scars.             IMAGING AND PROCEDURES  Imaging and Procedures for 11/15/21           ASSESSMENT/PLAN:  Cystoid macular edema of right eye Post removal of silicone oil, macular remained attached.  Acuity postop day #1 improved now to 20/70 - best corrected via pinhole  Traction retinal detachment involving macula, right Vitrectomy removal silicone oil, retina remained attached postop day #1  11-15-2021      ICD-10-CM   1. Cystoid macular edema of right eye  H35.351     2. Traction retinal detachment involving macula, right  H33.41       1.  Postop day #1 looks great after vitrectomy, removal silicone oil  implant and injection small amount of Kenalog (washed, triessence not available), for underlying cystoid macular edema seen preoperatively  2.  3.  Ophthalmic Meds Ordered this visit:  No orders of the defined types were placed in this encounter.      Return in about 1 week (around 11/22/2021) for dilate, OD, POST OP, OCT.  Patient Instructions  Ofloxacin  4 times daily to the operative eye  Prednisolone acetate 1 drop to the operative eye 4 times daily  Patient instructed not to refill the medications and use them for maximum of 3 weeks.  Patient instructed do not rub the eye.  Patient has the option to use the patch at night.   No lifting and bending for 1 week. No water IN the eye for 10 days. Do not rub the eye. Wear shield at night for 1-3 days.  Continue your topical medications for a total of 3 weeks.  Do not refill your postoperative medications unless instructed.  Refrain from exercise or intentional activity which increases our heart rate above resting levels.  Normal walking to complete normal activities of your day are appropriate.  Driving:  Legally, you only need one good eye, of 20/40 or better to drive.  However, the practice does not recommend driving during first weeks after surgery, IF you are uncomfortable with your visual functioning or capabilities.   If you have known sleep apnea, wear your CPAP as you normally should.    Explained the diagnoses, plan, and follow up with the patient and they expressed understanding.  Patient expressed understanding of the importance of proper follow up care.   Clent Demark Francyne Arreaga M.D. Diseases & Surgery of the Retina and Vitreous [facility] 11/15/21     Abbreviations: M myopia (nearsighted); A astigmatism; H hyperopia (farsighted); P presbyopia; Mrx spectacle prescription;  CTL contact lenses; OD right eye; OS left eye; OU both eyes  XT exotropia; ET esotropia; PEK punctate epithelial keratitis; PEE  punctate epithelial erosions; DES dry eye syndrome; MGD meibomian gland dysfunction; ATs artificial tears; PFAT's preservative free artificial tears; [place] nuclear sclerotic cataract; PSC posterior subcapsular cataract; ERM epi-retinal membrane; PVD posterior vitreous detachment; RD retinal detachment; DM diabetes mellitus; DR diabetic retinopathy; NPDR non-proliferative diabetic retinopathy; PDR proliferative diabetic retinopathy; CSME clinically significant macular edema; DME diabetic macular edema; dbh dot blot hemorrhages; CWS cotton wool spot; POAG primary open angle glaucoma; C/D cup-to-disc ratio; HVF humphrey visual field; GVF goldmann visual field; OCT optical coherence tomography; IOP intraocular pressure; BRVO Branch retinal vein occlusion; CRVO central retinal vein occlusion; CRAO central retinal artery occlusion; BRAO branch retinal artery occlusion; RT retinal tear; SB scleral buckle; PPV pars plana vitrectomy; VH Vitreous hemorrhage; PRP panretinal laser photocoagulation; IVK intravitreal kenalog; VMT vitreomacular traction; MH Macular hole;  NVD neovascularization of the disc; NVE neovascularization elsewhere; AREDS age related eye disease study; ARMD age related macular degeneration; POAG primary open angle glaucoma; EBMD epithelial/anterior basement membrane dystrophy; ACIOL anterior chamber intraocular lens; IOL intraocular lens; PCIOL posterior chamber intraocular lens; Phaco/IOL phacoemulsification with intraocular lens placement; [place] photorefractive keratectomy; LASIK laser assisted in situ keratomileusis; HTN hypertension; DM diabetes mellitus; COPD chronic obstructive pulmonary disease

## 2021-11-15 NOTE — Assessment & Plan Note (Signed)
Vitrectomy removal silicone oil, retina remained attached postop day #1  11-15-2021

## 2021-11-21 ENCOUNTER — Encounter (INDEPENDENT_AMBULATORY_CARE_PROVIDER_SITE_OTHER): Payer: 59 | Admitting: Ophthalmology

## 2021-11-22 ENCOUNTER — Ambulatory Visit (INDEPENDENT_AMBULATORY_CARE_PROVIDER_SITE_OTHER): Payer: 59 | Admitting: Ophthalmology

## 2021-11-22 ENCOUNTER — Encounter (INDEPENDENT_AMBULATORY_CARE_PROVIDER_SITE_OTHER): Payer: Self-pay | Admitting: Ophthalmology

## 2021-11-22 ENCOUNTER — Other Ambulatory Visit: Payer: Self-pay

## 2021-11-22 DIAGNOSIS — H35351 Cystoid macular degeneration, right eye: Secondary | ICD-10-CM

## 2021-11-22 DIAGNOSIS — H3341 Traction detachment of retina, right eye: Secondary | ICD-10-CM

## 2021-11-22 DIAGNOSIS — H35371 Puckering of macula, right eye: Secondary | ICD-10-CM

## 2021-11-22 NOTE — Assessment & Plan Note (Signed)
Much improved OD, looks great post silicone oil removal.  With improved acuity and macular function with improved OCT findings ?

## 2021-11-22 NOTE — Patient Instructions (Signed)
Ofloxacin  4 times daily to the operative eye  Prednisolone acetate 1 drop to the operative eye 4 times daily  Patient instructed not to refill the medications and use them for maximum of 3 weeks.  Patient instructed do not rub the eye.  Patient has the option to use the patch at night. 

## 2021-11-22 NOTE — Assessment & Plan Note (Signed)
Condition appears resolved OD ?

## 2021-11-22 NOTE — Progress Notes (Signed)
11/22/2021     CHIEF COMPLAINT Patient presents for  Chief Complaint  Patient presents with   Post-op Follow-up      HISTORY OF PRESENT ILLNESS: Valerie Heath is a 71 y.o. female who presents to the clinic today for:   HPI     Post-op Follow-up           Laterality: right eye         Comments   1 week dilate OD OCT, PO vitrectomy, removal silicone oil implant and injection small amount of Kenalog sx 11/14/2021. Pt states "I think my vision is improving." Pt states she has been wearing the patch at night every night since surgery. Pt is using the "tan and white cap" drops four times a day in the right eye.      Last edited by Edmon Crape, MD on 11/22/2021  9:04 AM.      Referring physician: Cleatis Polka., MD [address] [place],  [place] [address]  HISTORICAL INFORMATION:   Selected notes from the MEDICAL RECORD NUMBER       CURRENT MEDICATIONS: No current outpatient medications on file. (Ophthalmic Drugs)   No current facility-administered medications for this visit. (Ophthalmic Drugs)   Current Outpatient Medications (Other)  Medication Sig   b complex vitamins capsule Take 1 capsule by mouth daily.   Cholecalciferol (VITAMIN D) 50 MCG (2000 UT) CAPS Take 1 capsule (2,000 Units total) by mouth daily.   clobetasol ointment (TEMOVATE) 0.05 % Use a pea sized amount topically BID for up to 1-2 weeks as needed for a flare. Not for daily long term use.   levothyroxine (SYNTHROID) 125 MCG tablet Take 1 tablet (125 mcg total) by mouth daily before breakfast.   valACYclovir (VALTREX) 500 MG tablet TAKE 4 TABLETS BY MOUTH ON START OF UCLERATION, THEN 4 TABLETS 12 HOURS LATER   No current facility-administered medications for this visit. (Other)      REVIEW OF SYSTEMS: ROS   Negative for: Constitutional, Gastrointestinal, Neurological, Skin, Genitourinary, Musculoskeletal, HENT, Endocrine, Cardiovascular, Eyes, Respiratory, Psychiatric, Allergic/Imm,  Heme/Lymph Last edited by Edmon Crape, MD on 11/22/2021  9:00 AM.       ALLERGIES Allergies  Allergen Reactions   Erythromycin Nausea Only    PAST MEDICAL HISTORY Past Medical History:  Diagnosis Date   Breast cancer ([facility])    Breast cancer of upper-outer quadrant of left 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)    Breast cancer of upper-outer quadrant of right 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)    Cancer ([facility])    breast cancer-lt   Fibroid    Hypothyroid    Personal history of radiation therapy    Posterior vitreous detachment of 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   Radiation 06/16/14-08/01/14   left and right breast    Past Surgical History:  Procedure Laterality Date   BREAST BIOPSY     BREAST LUMPECTOMY Bilateral    2015   BREAST LUMPECTOMY WITH RADIOACTIVE SEED LOCALIZATION Left 02/21/2014   Procedure: BREAST LUMPECTOMY WITH RADIOACTIVE SEED LOCALIZATION;  Surgeon: Ernestene Mention, MD;  Location: [facility];  Service: General;  Laterality: Left;   BREAST SURGERY Right 04/2014   Lumpectomy    BREAST SURGERY Left    COLONOSCOPY     LYMPH NODE DISSECTION  04/27/14   right and left   MYOMECTOMY     REFRACTIVE SURGERY     X 2   SHOULDER SURGERY  2005   left   TONSILLECTOMY AND  ADENOIDECTOMY      FAMILY HISTORY Family History  Problem Relation Age of Onset   Hypertension Mother    Breast cancer Mother 65   Cancer Mother 18       breast cancer at 69 ; Fallopian tube tumor in her 31s   Diabetes Father    Hypertension Father    Heart disease Father    Prostate cancer Father    Cancer Father 58       prostate cancer   Colon cancer Paternal Uncle    Cancer Paternal Uncle 78       colon cancer   Throat cancer Maternal Uncle    Cancer Maternal Uncle 45       prostate cancer   Prostate cancer Maternal Uncle    Breast cancer Maternal Aunt 37   Leukemia Paternal Grandfather    Leukemia Maternal Aunt    Cancer Paternal Uncle 43       brain cancer   Brain cancer Paternal Uncle     SOCIAL  HISTORY Social History   Tobacco Use   Smoking status: Never   Smokeless tobacco: Never  Vaping Use   Vaping Use: Never used  Substance Use Topics   Alcohol use: Yes    Alcohol/week: 2.0 standard drinks    Types: 2 Standard drinks or equivalent per week    Comment: Socially   Drug use: No         OPHTHALMIC EXAM:  Base Eye Exam     Visual Acuity (ETDRS)       Right Left   Dist [place] 20/60 -2 20/20   Dist ph [place] NI          Tonometry (Tonopen, 8:46 AM)       Right Left   Pressure 16 16         Pupils       Pupils APD   Right PERRL None   Left PERRL None         Extraocular Movement       Right Left    Full Full         Neuro/Psych     Oriented x3: Yes   Mood/Affect: Normal         Dilation     Right eye: 1.0% Mydriacyl, 2.5% Phenylephrine @ 8:46 AM           Slit Lamp and Fundus Exam     External Exam       Right Left   External Normal          Slit Lamp Exam       Right Left   Lids/Lashes 1+ edema    Conjunctiva/Sclera 2+ Injection    Cornea Clear    Anterior Chamber Deep and quiet    Iris Round and reactive    Lens Centered posterior chamber intraocular lens    Anterior Vitreous Clear          Fundus Exam       Right Left   Posterior Vitreous Clear, avitric    Disc Normal    C/D Ratio 0.2    Macula Macula attached, macula clearly seen.  No topographic distortion remains no preretinal fibrosis, no cystoid macular edema    Vessels Normal    Periphery Retinopexy surrounding retinotomy inferior, retina attached, good laser retinopexy demarcates each of the previous chorioretinal scars.             IMAGING AND PROCEDURES  Imaging and  Procedures for 11/22/21  OCT, Retina - OU - Both Eyes       Right Eye Quality was good. Scan locations included subfoveal. Central Foveal Thickness: 381. Progression has worsened.   Left Eye Quality was good. Scan locations included subfoveal. Central Foveal Thickness:  325. Progression has been stable. Findings include epiretinal membrane.   Notes OD, Macula attached, much less epiretinal tissue over the macular region.  Near complete resolution of CME OD, looks great 1 week post vitrectomy membrane peel removal of silicone oil  Epiretinal membrane superior temporal to macula OS, with minor topographic changes, no distortion to fovea OS no impact on acuity             ASSESSMENT/PLAN:  Epiretinal membrane, right eye Condition resolved  Cystoid macular edema of right eye Much improved OD, looks great post silicone oil removal.  With improved acuity and macular function with improved OCT findings  Traction retinal detachment involving macula, right Condition appears resolved OD     ICD-10-CM   1. Cystoid macular edema of right eye  H35.351 OCT, Retina - OU - Both Eyes    2. Epiretinal membrane, right eye  H35.371     3. Traction retinal detachment involving macula, right  H33.41       1.  2.  3.  Ophthalmic Meds Ordered this visit:  No orders of the defined types were placed in this encounter.      Return in about 4 weeks (around 12/20/2021) for dilate, OD, POST OP.  Patient Instructions  Ofloxacin  4 times daily to the operative eye  Prednisolone acetate 1 drop to the operative eye 4 times daily  Patient instructed not to refill the medications and use them for maximum of 3 weeks.  Patient instructed do not rub the eye.  Patient has the option to use the patch at night.    Explained the diagnoses, plan, and follow up with the patient and they expressed understanding.  Patient expressed understanding of the importance of proper follow up care.   Alford Highland Wynell Halberg M.D. Diseases & Surgery of the Retina and Vitreous [facility] 11/22/21     Abbreviations: M myopia (nearsighted); A astigmatism; H hyperopia (farsighted); P presbyopia; Mrx spectacle prescription;  CTL contact lenses; OD right eye; OS left  eye; OU both eyes  XT exotropia; ET esotropia; PEK punctate epithelial keratitis; PEE punctate epithelial erosions; DES dry eye syndrome; MGD meibomian gland dysfunction; ATs artificial tears; PFAT's preservative free artificial tears; NSC nuclear sclerotic cataract; PSC posterior subcapsular cataract; ERM epi-retinal membrane; PVD posterior vitreous detachment; RD retinal detachment; DM diabetes mellitus; DR diabetic retinopathy; NPDR non-proliferative diabetic retinopathy; PDR proliferative diabetic retinopathy; CSME clinically significant macular edema; DME diabetic macular edema; dbh dot blot hemorrhages; CWS cotton wool spot; POAG primary open angle glaucoma; C/D cup-to-disc ratio; HVF humphrey visual field; GVF goldmann visual field; OCT optical coherence tomography; IOP intraocular pressure; BRVO Branch retinal vein occlusion; CRVO central retinal vein occlusion; CRAO central retinal artery occlusion; BRAO branch retinal artery occlusion; RT retinal tear; SB scleral buckle; PPV pars plana vitrectomy; VH Vitreous hemorrhage; PRP panretinal laser photocoagulation; IVK intravitreal kenalog; VMT vitreomacular traction; MH Macular hole;  NVD neovascularization of the disc; NVE neovascularization elsewhere; AREDS age related eye disease study; ARMD age related macular degeneration; POAG primary open angle glaucoma; EBMD epithelial/anterior basement membrane dystrophy; ACIOL anterior chamber intraocular lens; IOL intraocular lens; PCIOL posterior chamber intraocular lens; Phaco/IOL phacoemulsification with intraocular lens placement; PRK photorefractive  keratectomy; LASIK laser assisted in situ keratomileusis; HTN hypertension; DM diabetes mellitus; COPD chronic obstructive pulmonary disease

## 2021-11-22 NOTE — Assessment & Plan Note (Signed)
Condition resolved 

## 2021-12-20 ENCOUNTER — Encounter (INDEPENDENT_AMBULATORY_CARE_PROVIDER_SITE_OTHER): Payer: 59 | Admitting: Ophthalmology

## 2021-12-20 ENCOUNTER — Encounter (INDEPENDENT_AMBULATORY_CARE_PROVIDER_SITE_OTHER): Payer: Self-pay | Admitting: Ophthalmology

## 2021-12-20 ENCOUNTER — Ambulatory Visit (INDEPENDENT_AMBULATORY_CARE_PROVIDER_SITE_OTHER): Payer: 59 | Admitting: Ophthalmology

## 2021-12-20 DIAGNOSIS — H35351 Cystoid macular degeneration, right eye: Secondary | ICD-10-CM

## 2021-12-20 DIAGNOSIS — H3341 Traction detachment of retina, right eye: Secondary | ICD-10-CM

## 2021-12-20 MED ORDER — KETOROLAC TROMETHAMINE 0.5 % OP SOLN: 1.0000 [drp] | Freq: Four times a day (QID) | OPHTHALMIC | 0 refills | Status: DC

## 2021-12-20 NOTE — Assessment & Plan Note (Signed)
Mild to moderate CME slightly increasing, as the intravitreal Kenalog injected at the time of silicone oil removal [phone] was waning. ? ?Will commence therapy OD today with ketorolac 1 drop right eye 4 times daily ? ? ?

## 2021-12-20 NOTE — Assessment & Plan Note (Signed)
OD, with persistent blurred vision ?

## 2021-12-20 NOTE — Progress Notes (Signed)
? ? ?12/20/2021 ? ?  ? ?CHIEF COMPLAINT ?Patient presents for  ?Chief Complaint  ?Patient presents with  ? Post-op Follow-up  ? ? ? ? ?HISTORY OF PRESENT ILLNESS: ?Valerie Heath is a 71 y.o. female who presents to the clinic today for:  ? ?HPI   ?PO 4 weeks, dilate OD.  ?"I think it is improving well, I still have a little distortion in the right eye." ?Pt is not using any prescription eye drops at this time. ?Last edited by Laurin Coder on 12/20/2021 10:48 AM.  ?  ? ? ?Referring physician: ?Ginger Organ., MD ?[address] ?[place],  [place] [phone] ? ?HISTORICAL INFORMATION:  ? ?Selected notes from the [facility] ?  ?   ? ?CURRENT MEDICATIONS: ?Current Outpatient Medications (Ophthalmic Drugs)  ?Medication Sig  ? ketorolac (ACULAR) 0.5 % ophthalmic solution Place 1 drop into the right eye 4 (four) times daily.  ? ?No current facility-administered medications for this visit. (Ophthalmic Drugs)  ? ?Current Outpatient Medications (Other)  ?Medication Sig  ? b complex vitamins capsule Take 1 capsule by mouth daily.  ? Cholecalciferol (VITAMIN D) 50 MCG (2000 UT) CAPS Take 1 capsule (2,000 Units total) by mouth daily.  ? clobetasol ointment (TEMOVATE) 0.05 % Use a pea sized amount topically BID for up to 1-2 weeks as needed for a flare. Not for daily long term use.  ? levothyroxine (SYNTHROID) 125 MCG tablet Take 1 tablet (125 mcg total) by mouth daily before breakfast.  ? valACYclovir (VALTREX) 500 MG tablet TAKE 4 TABLETS BY MOUTH ON START OF UCLERATION, THEN 4 TABLETS 12 HOURS LATER  ? ?No current facility-administered medications for this visit. (Other)  ? ? ? ? ?REVIEW OF SYSTEMS: ?ROS   ?Negative for: Constitutional, Gastrointestinal, Neurological, Skin, Genitourinary, Musculoskeletal, HENT, Endocrine, Cardiovascular, Eyes, Respiratory, Psychiatric, Allergic/Imm, Heme/Lymph ?Last edited by Hurman Horn, MD on 12/20/2021 11:27 AM.  ?  ? ? ? ?ALLERGIES ?Allergies  ?Allergen Reactions  ?  Erythromycin Nausea Only  ? ? ?PAST MEDICAL HISTORY ?Past Medical History:  ?Diagnosis Date  ? Breast cancer ([place])   ? Breast cancer of upper-outer quadrant of left female breast ([place])   ? Breast cancer of upper-outer quadrant of right female breast ([place])   ? Cancer [facility])   ? breast cancer-lt  ? Fibroid   ? Hypothyroid   ? Personal history of radiation therapy   ? Posterior vitreous detachment of 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  ? Radiation 06/16/14-08/01/14  ? left and right breast   ? ?Past Surgical History:  ?Procedure Laterality Date  ? BREAST BIOPSY    ? BREAST LUMPECTOMY Bilateral   ? 2015  ? BREAST LUMPECTOMY WITH RADIOACTIVE SEED LOCALIZATION Left 02/21/2014  ? Procedure: BREAST LUMPECTOMY WITH RADIOACTIVE SEED LOCALIZATION;  Surgeon: Adin Hector, MD;  Location: [facility];  Service: General;  Laterality: Left;  ? BREAST SURGERY Right 04/2014  ? Lumpectomy   ? BREAST SURGERY Left   ? COLONOSCOPY    ? LYMPH NODE DISSECTION  04/27/14  ? right and left  ? MYOMECTOMY    ? REFRACTIVE SURGERY    ? X 2  ? SHOULDER SURGERY  2005  ? left  ? TONSILLECTOMY AND ADENOIDECTOMY    ? ? ?FAMILY HISTORY ?Family History  ?Problem Relation Age of Onset  ? Hypertension Mother   ? Breast cancer Mother 62  ? Cancer Mother 62  ?     breast cancer at 73 ;  Fallopian tube tumor in her 28s  ? Diabetes Father   ? Hypertension Father   ? Heart disease Father   ? Prostate cancer Father   ? Cancer Father 52  ?     prostate cancer  ? Colon cancer Paternal Uncle   ? Cancer Paternal Uncle 105  ?     colon cancer  ? Throat cancer Maternal Uncle   ? Cancer Maternal Uncle 87  ?     prostate cancer  ? Prostate cancer Maternal Uncle   ? Breast cancer Maternal Aunt 38  ? Leukemia Paternal Grandfather   ? Leukemia Maternal Aunt   ? Cancer Paternal Uncle 25  ?     brain cancer  ? Brain cancer Paternal Uncle   ? ? ?SOCIAL HISTORY ?Social History  ? ?Tobacco Use  ? Smoking status: Never  ? Smokeless tobacco: Never  ?Vaping Use  ? Vaping Use: Never used   ?Substance Use Topics  ? Alcohol use: Yes  ?  Alcohol/week: 2.0 standard drinks  ?  Types: 2 Standard drinks or equivalent per week  ?  Comment: Socially  ? Drug use: No  ? ?  ? ?  ? ?OPHTHALMIC EXAM: ? ?Base Eye Exam   ? ? Visual Acuity (ETDRS)   ? ?   Right Left  ? Dist [place] 20/70 +2 20/20  ? Dist ph [place] NI   ? ?  ?  ? ? Tonometry (Tonopen, 10:51 AM)   ? ?   Right Left  ? Pressure 7 8  ? ?  ?  ? ? Tonometry #2 (Tonopen, 10:52 AM)   ? ?   Right Left  ? Pressure 9 10  ? ?  ?  ? ? Pupils   ? ?   Pupils APD  ? Right PERRL None  ? Left PERRL None  ? ?  ?  ? ? Extraocular Movement   ? ?   Right Left  ?  Full Full  ? ?  ?  ? ? Neuro/Psych   ? ? Oriented x3: Yes  ? Mood/Affect: Normal  ? ?  ?  ? ? Dilation   ? ? Right eye: 1.0% Mydriacyl, 2.5% Phenylephrine @ 10:51 AM  ? ?  ?  ? ?  ? ?Slit Lamp and Fundus Exam   ? ? External Exam   ? ?   Right Left  ? External Normal   ? ?  ?  ? ? Slit Lamp Exam   ? ?   Right Left  ? Lids/Lashes 1+ edema   ? Conjunctiva/Sclera 2+ Injection   ? Cornea Clear   ? Anterior Chamber Deep and quiet   ? Iris Round and reactive   ? Lens Centered posterior chamber intraocular lens   ? Anterior Vitreous Clear   ? ?  ?  ? ? Fundus Exam   ? ?   Right Left  ? Posterior Vitreous Clear, avitric   ? Disc Normal   ? C/D Ratio 0.2   ? Macula Macula attached, macula clearly seen.  No topographic distortion remains no preretinal fibrosis, no cystoid macular edema detected clinically   ? Vessels Normal   ? Periphery Retinopexy surrounding retinotomy inferior, retina attached, good laser retinopexy demarcates each of the previous chorioretinal scars.   ? ?  ?  ? ?  ? ? ?IMAGING AND PROCEDURES  ?Imaging and Procedures for 12/20/21 ? ?OCT, Retina - OU - Both Eyes   ? ?   ?  Right Eye ?Quality was good. Scan locations included subfoveal. Progression has worsened.  ? ?Left Eye ?Quality was good. Scan locations included subfoveal. Central Foveal Thickness: 325. Progression has been stable. Findings include epiretinal  membrane.  ? ?Notes ?OD, Macula attached, much less epiretinal tissue over the macular region.  Near complete resolution of CME OD, looks great 1 week post vitrectomy membrane peel removal of silicone oil ? ?Epiretinal membrane superior temporal to macula OS, with minor topographic changes, no distortion to fovea OS no impact on acuity ? ?  ? ? ?  ?  ? ?  ?ASSESSMENT/PLAN: ? ?Traction retinal detachment involving macula, right ?OD, with persistent blurred vision ? ?Cystoid macular edema of right eye ?Mild to moderate CME slightly increasing, as the intravitreal Kenalog injected at the time of silicone oil removal [id number] was waning. ? ?Will commence therapy OD today with ketorolac 1 drop right eye 4 times daily ? ?  ? ?  ICD-10-CM   ?1. Cystoid macular edema of right eye  H35.351 OCT, Retina - OU - Both Eyes  ?  ?2. Traction retinal detachment involving macula, right  H33.41   ?  ? ? ?1.  Increasing CME OD as intravitreal Kenalog delivered at the time of surgical removal of silicone oil, we will thus need to treat with topical NSAIDs.  I also instructed the patient OD not to compress mash or rub the eye, is a question whether she might sleep in the bed the right side into a pillow. ? ?2. ? ?3. ? ?Ophthalmic Meds Ordered this visit:  ?Meds ordered this encounter  ?Medications  ? ketorolac (ACULAR) 0.5 % ophthalmic solution  ?  Sig: Place 1 drop into the right eye 4 (four) times daily.  ?  Dispense:  5 mL  ?  Refill:  0  ? ? ?  ? ?Return in about 4 weeks (around 01/17/2022) for dilate, OD, OCT. ? ?There are no Patient Instructions on file for this visit. ? ? ?Explained the diagnoses, plan, and follow up with the patient and they expressed understanding.  Patient expressed understanding of the importance of proper follow up care.  ? ?Clent Demark. Yaeko Fazekas M.D. ?Diseases & Surgery of the Retina and Vitreous ?[facility] ?12/20/21 ? ? ? ? ?Abbreviations: ?M myopia (nearsighted); A astigmatism; H hyperopia  (farsighted); P presbyopia; Mrx spectacle prescription;  CTL contact lenses; OD right eye; OS left eye; OU both eyes  XT exotropia; ET esotropia; PEK punctate epithelial keratitis; PEE punctate epithelial erosions;

## 2022-01-10 ENCOUNTER — Other Ambulatory Visit: Payer: Self-pay | Admitting: Gastroenterology

## 2022-01-10 DIAGNOSIS — R1310 Dysphagia, unspecified: Secondary | ICD-10-CM

## 2022-01-14 ENCOUNTER — Ambulatory Visit (INDEPENDENT_AMBULATORY_CARE_PROVIDER_SITE_OTHER): Payer: 59 | Admitting: Ophthalmology

## 2022-01-14 ENCOUNTER — Ambulatory Visit
Admission: RE | Admit: 2022-01-14 | Discharge: 2022-01-14 | Disposition: A | Discharge status: Home / Self Care | Payer: 59 | Source: Ambulatory Visit | Attending: Gastroenterology | Admitting: Gastroenterology

## 2022-01-14 ENCOUNTER — Encounter (INDEPENDENT_AMBULATORY_CARE_PROVIDER_SITE_OTHER): Payer: Self-pay | Admitting: Ophthalmology

## 2022-01-14 DIAGNOSIS — H33011 Retinal detachment with single break, right eye: Secondary | ICD-10-CM | POA: Diagnosis not present

## 2022-01-14 DIAGNOSIS — H35351 Cystoid macular degeneration, right eye: Secondary | ICD-10-CM

## 2022-01-14 DIAGNOSIS — R1310 Dysphagia, unspecified: Secondary | ICD-10-CM

## 2022-01-14 MED ORDER — TOBRAMYCIN 0.3 % OP SOLN: 1.0000 [drp] | OPHTHALMIC | 0 refills | Status: AC

## 2022-01-14 MED ORDER — PREDNISOLONE ACETATE 1 % OP SUSP: 1.0000 [drp] | Freq: Four times a day (QID) | OPHTHALMIC | 0 refills | Status: DC

## 2022-01-14 NOTE — Progress Notes (Addendum)
01/14/2022     CHIEF COMPLAINT Patient presents for  Chief Complaint  Patient presents with   Cystoid Macular Edema      HISTORY OF PRESENT ILLNESS: Valerie Heath is a 71 y.o. female who presents to the clinic today for:   HPI   4 WEEKS DILATE OD, OCT. "Over the weekend I thought it was a new floater. It is kind of blurry. It looks kind of liquid in it. It currently still looks this way. They took the oil out I think on the 22nd of February." Patient is using the grey top drop QID OD, almost out per patient. Last edited by Nelva Nay on 01/14/2022  3:41 PM.      Referring physician: Cleatis Polka., MD [address] [place],  [place] [address]  HISTORICAL INFORMATION:   Selected notes from the MEDICAL RECORD NUMBER       CURRENT MEDICATIONS: Current Outpatient Medications (Ophthalmic Drugs)  Medication Sig   prednisoLONE acetate (PRED FORTE) 1 % ophthalmic suspension Place 1 drop into the right eye 4 (four) times daily.   tobramycin (TOBREX) 0.3 % ophthalmic solution Place 1 drop into the right eye every 4 (four) hours for 10 days.   ketorolac (ACULAR) 0.5 % ophthalmic solution Place 1 drop into the right eye 4 (four) times daily.   No current facility-administered medications for this visit. (Ophthalmic Drugs)   Current Outpatient Medications (Other)  Medication Sig   b complex vitamins capsule Take 1 capsule by mouth daily.   Cholecalciferol (VITAMIN D) 50 MCG (2000 UT) CAPS Take 1 capsule (2,000 Units total) by mouth daily.   clobetasol ointment (TEMOVATE) 0.05 % Use a pea sized amount topically BID for up to 1-2 weeks as needed for a flare. Not for daily long term use.   levothyroxine (SYNTHROID) 125 MCG tablet Take 1 tablet (125 mcg total) by mouth daily before breakfast.   valACYclovir (VALTREX) 500 MG tablet TAKE 4 TABLETS BY MOUTH ON START OF UCLERATION, THEN 4 TABLETS 12 HOURS LATER   No current facility-administered medications for this visit.  (Other)      REVIEW OF SYSTEMS: ROS   Negative for: Constitutional, Gastrointestinal, Neurological, Skin, Genitourinary, Musculoskeletal, HENT, Endocrine, Cardiovascular, Eyes, Respiratory, Psychiatric, Allergic/Imm, Heme/Lymph Last edited by Edmon Crape, MD on 01/14/2022  4:05 PM.       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 02/22/2021   Retinal detachment via scleral buckle, cryopexy external drainage subretinal fluid and injection of temporary gas  02/23/2021   right eye   Past Surgical History:  Procedure Laterality Date   BREAST BIOPSY     BREAST LUMPECTOMY Bilateral    2015   BREAST LUMPECTOMY WITH RADIOACTIVE SEED LOCALIZATION Left 02/21/2014   Procedure: BREAST LUMPECTOMY WITH RADIOACTIVE SEED LOCALIZATION;  Surgeon: Ernestene Mention, MD;  Location: [facility];  Service: General;  Laterality: Left;   BREAST SURGERY Right 04/2014   Lumpectomy    BREAST SURGERY Left    COLONOSCOPY     LYMPH NODE DISSECTION  04/27/14   right and left   MYOMECTOMY     REFRACTIVE SURGERY     X 2   SHOULDER SURGERY  2005   left   TONSILLECTOMY AND ADENOIDECTOMY      FAMILY HISTORY Family History  Problem Relation Age of Onset   Hypertension Mother    Breast cancer Mother 55   Cancer Mother 67       breast cancer at 11 ; Fallopian tube tumor in her 10s   Diabetes Father    Hypertension Father    Heart disease Father    Prostate cancer Father    Cancer  Father 44       prostate cancer   Colon cancer Paternal Uncle    Cancer Paternal Uncle 46       colon cancer   Throat cancer Maternal Uncle    Cancer Maternal Uncle 45       prostate cancer   Prostate cancer Maternal Uncle    Breast cancer Maternal Aunt 88   Leukemia Paternal Grandfather    Leukemia Maternal Aunt    Cancer Paternal Uncle 54       brain cancer   Brain cancer Paternal Uncle     SOCIAL HISTORY Social History   Tobacco Use   Smoking status: Never   Smokeless tobacco: Never  Vaping Use   Vaping Use: Never used  Substance Use Topics   Alcohol use: Yes    Alcohol/week: 2.0 standard drinks    Types: 2 Standard drinks or equivalent per week    Comment: Socially   Drug use: No         OPHTHALMIC EXAM:  Base Eye Exam     Visual Acuity (ETDRS)       Right Left   Dist [place] 20/100 -2 20/25 -2   Dist ph [place] 20/80          Tonometry (Tonopen, 3:46 PM)       Right Left   Pressure 13 5         Pupils       Pupils APD   Right PERRL None   Left PERRL None         Visual Fields (Counting fingers)       Left Right   Restrictions  Partial outer inferior temporal deficiency         Extraocular Movement       Right Left    Full Full         Neuro/Psych     Oriented x3: Yes   Mood/Affect: Normal         Dilation     Right eye: 1.0% Mydriacyl, 2.5% Phenylephrine @ 3:46 PM           Slit Lamp and Fundus Exam     External Exam       Right Left   External Normal          Slit Lamp Exam       Right Left   Lids/Lashes 1+ edema    Conjunctiva/Sclera 2+ Injection    Cornea Clear    Anterior Chamber Deep and quiet    Iris Round and reactive    Lens Centered posterior chamber intraocular lens    Anterior Vitreous Clear  Fundus Exam       Right Left   Posterior Vitreous Clear, avitric    Disc Normal    C/D Ratio 0.2    Macula Macula attached, macula clearly seen.  No topographic distortion remains no  preretinal fibrosis, no cystoid macular edema detected clinically    Vessels Normal    Periphery Retinopexy surrounding retinotomy inferior, retina attached temporally and inferotemporally, good laser retinopexy demarcates each of the previous chorioretinal scars and temporally and inferotemporally with great high scleral buckle, New break at 1230 meridian at the equator, adjacent to an area of treated lattice degeneration.  With rhegmatogenous retinal detachment extending from 12:00 superiorly and then extending nasally down to 530 meridian.  No other breaks seen, chorioretinal scar inferior with no holes.             IMAGING AND PROCEDURES  Imaging and Procedures for 01/14/22  OCT, Retina - OU - Both Eyes       Right Eye Quality was good. Scan locations included subfoveal. Central Foveal Thickness: 471. Progression has worsened. Findings include cystoid macular edema.   Left Eye Quality was good. Scan locations included subfoveal. Central Foveal Thickness: 326. Progression has been stable. Findings include epiretinal membrane.   Notes OD, Macula attached, much less epiretinal tissue over the macular region. New and increased CME,  attached OD   Epiretinal membrane superior temporal to macula OS, with minor topographic changes, no distortion to fovea OS no impact on acuity     B-Scan Ultrasound - OD - Right Eye       Quality was good.   Notes New nasal retinal detachment, rhegmatogenous, macula flat     DG ESOPHAGUS W DOUBLE CM (HD)       CLINICAL DATA:  Globus sensation in the chest for 4 months. Dysphagia, unspecified type  EXAM: ESOPHOGRAM / BARIUM SWALLOW / BARIUM TABLET STUDY  TECHNIQUE: Combined double contrast and single contrast examination performed using effervescent crystals, thick barium liquid, and thin barium liquid. The patient was observed with fluoroscopy swallowing a 13 mm barium sulphate tablet.  FLUOROSCOPY: Radiation Exposure Index (as  provided by the fluoroscopic device): 11 mGy Kerma  COMPARISON:  None.  FINDINGS: Normal oral and pharyngeal phase of swallowing, with no laryngeal penetration or tracheobronchial aspiration. No significant barium retention in the pharynx. No evidence of pharyngeal mass, stricture or diverticulum. No significant cricopharyngeus muscle dysfunction.  Mild esophageal dysmotility, characterized by intermittent mild weakening of primary peristalsis in the thoracic esophagus. No hiatal hernia. Mild-to-moderate gastroesophageal reflux elicited to the level of the mid thoracic esophagus. Very mild granularity of mid to lower thoracic esophageal mucosa. Short segment of minimal smooth narrowing in the lower thoracic esophagus approximately a 2 cm above the esophagogastric junction. Otherwise normal esophageal distensibility, with no evidence of esophageal mass, ulcer or stricture. Barium tablet traversed the esophagus into the stomach without significant delay.  IMPRESSION: 1. Mild-to-moderate gastroesophageal reflux elicited. No hiatal hernia. 2. Mild esophageal dysmotility, characteristic of chronic reflux related dysmotility. 3. Very mild granularity of the mid to lower thoracic esophageal mucosa. Short segment of minimal smooth narrowing in the lower thoracic esophagus. These findings suggest reflux esophagitis, with Barrett's esophagus not excluded. Upper endoscopic correlation suggested. 4. No evidence of esophageal mass or clinically significant esophageal stricture.   Electronically Signed   By: Delbert Phenix M.D.   On: 01/14/2022 10:31              ASSESSMENT/PLAN:  Retinal detachment of right eye  with single break OD with new retinal break outside the previous area of previous pathology which was temporally inferotemporally OD.  Retinal break is at the equator centered at 1230 meridian.  In the region of previous laser treated retinopexy for lattice degeneration.   Break is too far posterior for repair with in office pneumatic retinopexy.  This does suggest a tot hyaloid face in this area as there is no visible membranes in this area on clinical examination today   Risks and benefits of therapy reviewed with patient, and need for intervention for this new area of pathology, where likley vit traction , residual peripheral vit has triggered new break and RD.  I plan to repair with vit, laser ,gas injection, C3F8. OD.   I will try to avoid oil injection unless confronted with new findings or developments at surgery     ICD-10-CM   1. Retinal detachment of right eye with single break  H33.011 B-Scan Ultrasound - OD - Right Eye    2. Cystoid macular edema of right eye  H35.351 OCT, Retina - OU - Both Eyes      1.  OD with new retinal break outside area of previous pathology.  Total break located at 12:30 position at the equator in a linear rinse fashion, this does suggest potential residual posterior or peripheral hyaloid vitreous base type traction causing tear followed by nasal detachment.  2.  We will plan repair via vitrectomy, endolaser focally, internal drainage of subretinal fluid, gas injection due to the paucity of other findings with no severe epiretinal proliferations that appear new  3.  Ophthalmic Meds Ordered this visit:  Meds ordered this encounter  Medications   tobramycin (TOBREX) 0.3 % ophthalmic solution    Sig: Place 1 drop into the right eye every 4 (four) hours for 10 days.    Dispense:  5 mL    Refill:  0   prednisoLONE acetate (PRED FORTE) 1 % ophthalmic suspension    Sig: Place 1 drop into the right eye 4 (four) times daily.    Dispense:  10 mL    Refill:  0       Return [facility], for Schedule vitrectomy, focal laser, gas injection for superior break , OD.  Patient Instructions  New medication list  Ketorolac decreased to 1 drop right eye twice daily  Begin treatment Tobrex 1 drop right eye 4 times  daily  Prednisolone acetate 1 drop right eye 4 times daily   Explained the diagnoses, plan, and follow up with the patient and they expressed understanding.  Patient expressed understanding of the importance of proper follow up care.   Alford Highland Marlena Barbato M.D. Diseases & Surgery of the Retina and Vitreous [facility] 01/14/22     Abbreviations: M myopia (nearsighted); A astigmatism; H hyperopia (farsighted); P presbyopia; Mrx spectacle prescription;  CTL contact lenses; OD right eye; OS left eye; OU both eyes  XT exotropia; ET esotropia; PEK punctate epithelial keratitis; PEE punctate epithelial erosions; DES dry eye syndrome; MGD meibomian gland dysfunction; ATs artificial tears; PFAT's preservative free artificial tears; NSC nuclear sclerotic cataract; PSC posterior subcapsular cataract; ERM epi-retinal membrane; PVD posterior vitreous detachment; RD retinal detachment; DM diabetes mellitus; DR diabetic retinopathy; NPDR non-proliferative diabetic retinopathy; PDR proliferative diabetic retinopathy; CSME clinically significant macular edema; DME diabetic macular edema; dbh dot blot hemorrhages; CWS cotton wool spot; POAG primary open angle glaucoma; C/D cup-to-disc ratio; HVF humphrey visual field; GVF goldmann visual field;  OCT optical coherence tomography; IOP intraocular pressure; BRVO Branch retinal vein occlusion; CRVO central retinal vein occlusion; CRAO central retinal artery occlusion; BRAO branch retinal artery occlusion; RT retinal tear; SB scleral buckle; PPV pars plana vitrectomy; VH Vitreous hemorrhage; PRP panretinal laser photocoagulation; IVK intravitreal kenalog; VMT vitreomacular traction; MH Macular hole;  NVD neovascularization of the disc; NVE neovascularization elsewhere; AREDS age related eye disease study; ARMD age related macular degeneration; POAG primary open angle glaucoma; EBMD epithelial/anterior basement membrane dystrophy; ACIOL anterior chamber  intraocular lens; IOL intraocular lens; PCIOL posterior chamber intraocular lens; Phaco/IOL phacoemulsification with intraocular lens placement; PRK photorefractive keratectomy; LASIK laser assisted in situ keratomileusis; HTN hypertension; DM diabetes mellitus; COPD chronic obstructive pulmonary disease

## 2022-01-14 NOTE — Addendum Note (Signed)
Addended by: Deloria Lair A on: 01/14/2022 04:50 PM ? ? Modules accepted: Orders ? ?

## 2022-01-14 NOTE — Patient Instructions (Signed)
New medication list ? ?Ketorolac decreased to 1 drop right eye twice daily ? ?Begin treatment Tobrex 1 drop right eye 4 times daily ? ?Prednisolone acetate 1 drop right eye 4 times daily ?

## 2022-01-14 NOTE — Assessment & Plan Note (Addendum)
OD with new retinal break outside the previous area of previous pathology which was temporally inferotemporally OD.  Retinal break is at the equator centered at 1230 meridian.  In the region of previous laser treated retinopexy for lattice degeneration.  Break is too far posterior for repair with in office pneumatic retinopexy. ? ?This does suggest a tot hyaloid face in this area as there is no visible membranes in this area on clinical examination today ? ? ?Risks and benefits of therapy reviewed with patient, and need for intervention for this new area of pathology, where likley vit traction , residual peripheral vit has triggered new break and RD.  I plan to repair with vit, laser ,gas injection, C3F8. OD.   I will try to avoid oil injection unless confronted with new findings or developments at surgery ?

## 2022-01-15 ENCOUNTER — Other Ambulatory Visit: Payer: Self-pay | Admitting: Obstetrics and Gynecology

## 2022-01-15 DIAGNOSIS — Z1231 Encounter for screening mammogram for malignant neoplasm of breast: Secondary | ICD-10-CM

## 2022-01-16 ENCOUNTER — Encounter (AMBULATORY_SURGERY_CENTER): Payer: 59 | Admitting: Ophthalmology

## 2022-01-16 DIAGNOSIS — H33011 Retinal detachment with single break, right eye: Secondary | ICD-10-CM | POA: Diagnosis not present

## 2022-01-17 ENCOUNTER — Encounter (INDEPENDENT_AMBULATORY_CARE_PROVIDER_SITE_OTHER): Payer: 59 | Admitting: Ophthalmology

## 2022-01-17 ENCOUNTER — Ambulatory Visit (INDEPENDENT_AMBULATORY_CARE_PROVIDER_SITE_OTHER): Payer: 59 | Admitting: Ophthalmology

## 2022-01-17 DIAGNOSIS — H33011 Retinal detachment with single break, right eye: Secondary | ICD-10-CM

## 2022-01-17 NOTE — Assessment & Plan Note (Signed)
Postop day #1 looks great, retina reattached. ?

## 2022-01-17 NOTE — Patient Instructions (Signed)
Ofloxacin  4 times daily to the operative eye ? ?Prednisolone acetate 1 drop to the operative eye 4 times daily ? ?Patient instructed not to refill the medications and use them for maximum of 3 weeks. ? ?Patient instructed do not rub the eye.  Patient has the option to use the patch at night.  ? ?Patient instructed to discontinue the use of ketorolac for now ? ?The patient was found to be doing well, postoperatively, and was advised in the use of drops and home care.  Patient was advised not to rub eyes. Positioning was described as well, as precautions regarding intravitreal gas, if applicable. DO NOT TRAVEL TO [place], OR IN AIRPLANE, UNTIL THE BUBBLE INSIDE THE EYE HAS DISAPPEARED.  The use of eye patch at night is optional and was discussed. May use paper tape with patch if patient has dry skin and transpore tape for oily skin.   Use topical medications as ordered.  ?

## 2022-01-17 NOTE — Progress Notes (Signed)
? ? ?01/17/2022 ? ?  ? ?CHIEF COMPLAINT ?Patient presents for  ?Chief Complaint  ?Patient presents with  ? Post-op Follow-up  ? ? ? ? ?HISTORY OF PRESENT ILLNESS: ?Valerie Heath is a 71 y.o. female who presents to the clinic today for:  ? ?HPI   ? ? Post-op Follow-up   ? ?      ? Laterality: right eye  ? Discomfort: Negative for pain, itching, foreign body sensation, tearing, discharge and floaters  ? Vision: is worse  ? ?  ?  ? ? Comments   ?1 DAY POST OP OD SX 01/16/2022 ?Pt stated vision is a little blurry. ?Pt denies any floaters and FOL. ? ? ? ?  ?  ?Last edited by Silvestre Moment on 01/17/2022  8:26 AM.  ?  ? ? ?Referring physician: ?Ginger Organ., MD ?[address] ?[place],  [place] [address] ? ?HISTORICAL INFORMATION:  ? ?Selected notes from the [facility] ?  ?   ? ?CURRENT MEDICATIONS: ?Current Outpatient Medications (Ophthalmic Drugs)  ?Medication Sig  ? ketorolac (ACULAR) 0.5 % ophthalmic solution Place 1 drop into the right eye 4 (four) times daily.  ? prednisoLONE acetate (PRED FORTE) 1 % ophthalmic suspension Place 1 drop into the right eye 4 (four) times daily.  ? tobramycin (TOBREX) 0.3 % ophthalmic solution Place 1 drop into the right eye every 4 (four) hours for 10 days.  ? ?No current facility-administered medications for this visit. (Ophthalmic Drugs)  ? ?Current Outpatient Medications (Other)  ?Medication Sig  ? b complex vitamins capsule Take 1 capsule by mouth daily.  ? Cholecalciferol (VITAMIN D) 50 MCG (2000 UT) CAPS Take 1 capsule (2,000 Units total) by mouth daily.  ? clobetasol ointment (TEMOVATE) 0.05 % Use a pea sized amount topically BID for up to 1-2 weeks as needed for a flare. Not for daily long term use.  ? levothyroxine (SYNTHROID) 125 MCG tablet Take 1 tablet (125 mcg total) by mouth daily before breakfast.  ? valACYclovir (VALTREX) 500 MG tablet TAKE 4 TABLETS BY MOUTH ON START OF UCLERATION, THEN 4 TABLETS 12 HOURS LATER  ? ?No current facility-administered medications for  this visit. (Other)  ? ? ? ? ?REVIEW OF SYSTEMS: ?ROS   ?Negative for: Constitutional, Gastrointestinal, Neurological, Skin, Genitourinary, Musculoskeletal, HENT, Endocrine, Cardiovascular, Eyes, Respiratory, Psychiatric, Allergic/Imm, Heme/Lymph ?Last edited by Silvestre Moment on 01/17/2022  8:26 AM.  ?  ? ? ? ?ALLERGIES ?Allergies  ?Allergen Reactions  ? Erythromycin Nausea Only  ? ? ?PAST MEDICAL HISTORY ?Past Medical History:  ?Diagnosis Date  ? Breast cancer ([place])   ? Breast cancer of upper-outer quadrant of left female breast ([place])   ? Breast cancer of upper-outer quadrant of right female breast ([place])   ? Cancer [facility])   ? breast cancer-lt  ? Fibroid   ? Hypothyroid   ? Personal history of radiation therapy   ? Posterior vitreous detachment of 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  ? Radiation 06/16/14-08/01/14  ? left and right breast   ? 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 Retinal detachment via scleral buckle, cryopexy external drainage subretinal fluid and injection of temporary gas  02/23/2021   right eye  ? ?Past Surgical History:  ?Procedure Laterality Date  ? BREAST BIOPSY    ? BREAST LUMPECTOMY Bilateral   ? 2015  ? BREAST LUMPECTOMY WITH RADIOACTIVE SEED LOCALIZATION Left 02/21/2014  ? Procedure: BREAST LUMPECTOMY WITH RADIOACTIVE SEED LOCALIZATION;  Surgeon: Edsel Petrin  Dalbert Batman, MD;  Location: [facility];  Service: General;  Laterality: Left;  ? BREAST SURGERY Right 04/2014  ? Lumpectomy   ? BREAST SURGERY Left   ? COLONOSCOPY    ? LYMPH NODE DISSECTION  04/27/14  ? right and left  ? MYOMECTOMY    ? REFRACTIVE SURGERY    ? X 2  ? SHOULDER SURGERY  2005  ? left  ? TONSILLECTOMY AND ADENOIDECTOMY    ? ? ?FAMILY HISTORY ?Family History  ?Problem Relation Age of Onset  ? Hypertension Mother   ? Breast cancer Mother 78  ? Cancer Mother 19  ?     breast cancer at 21 ; Fallopian tube tumor in her 64s  ? Diabetes Father   ? Hypertension Father   ? Heart disease Father   ? Prostate cancer Father   ?  Cancer Father 70  ?     prostate cancer  ? Colon cancer Paternal Uncle   ? Cancer Paternal Uncle 56  ?     colon cancer  ? Throat cancer Maternal Uncle   ? Cancer Maternal Uncle 55  ?     prostate cancer  ? Prostate cancer Maternal Uncle   ? Breast cancer Maternal Aunt 45  ? Leukemia Paternal Grandfather   ? Leukemia Maternal Aunt   ? Cancer Paternal Uncle 77  ?     brain cancer  ? Brain cancer Paternal Uncle   ? ? ?SOCIAL HISTORY ?Social History  ? ?Tobacco Use  ? Smoking status: Never  ? Smokeless tobacco: Never  ?Vaping Use  ? Vaping Use: Never used  ?Substance Use Topics  ? Alcohol use: Yes  ?  Alcohol/week: 2.0 standard drinks  ?  Types: 2 Standard drinks or equivalent per week  ?  Comment: Socially  ? Drug use: No  ? ?  ? ?  ? ?OPHTHALMIC EXAM: ? ?Base Eye Exam   ? ? Visual Acuity (ETDRS)   ? ?   Right Left  ? Dist [place] HM 20/20 -2  ? ?  ?  ? ? Tonometry (Tonopen, 8:32 AM)   ? ?   Right Left  ? Pressure 15 12  ? ?  ?  ? ? Pupils   ? ?   Pupils APD  ? Right PERRL None  ? Left PERRL None  ? ?  ?  ? ? Extraocular Movement   ? ?   Right Left  ?  Full Full  ? ?  ?  ? ? Neuro/Psych   ? ? Oriented x3: Yes  ? Mood/Affect: Normal  ? ?  ?  ? ? Dilation   ? ? Right eye: 1.0% Mydriacyl, 2.5% Phenylephrine @ 8:32 AM  ? ?  ?  ? ?  ? ?Slit Lamp and Fundus Exam   ? ? External Exam   ? ?   Right Left  ? External Normal   ? ?  ?  ? ? Slit Lamp Exam   ? ?   Right Left  ? Lids/Lashes 1+ edema   ? Conjunctiva/Sclera 2+ Injection   ? Cornea Clear   ? Anterior Chamber Deep and quiet   ? Iris Round and reactive   ? Lens Centered posterior chamber intraocular lens   ? Anterior Vitreous Clear   ? ?  ?  ? ? Fundus Exam   ? ?   Right Left  ? Posterior Vitreous Clear, avitric, 95% gas   ? Disc Normal   ?  C/D Ratio 0.2   ? Macula Macula attached, macula clearly seen.  No topographic distortion remains no preretinal fibrosis, no cystoid macular edema detected clinically   ? Vessels Normal   ? Periphery Retinopexy surrounding retinotomy  inferior, retina attached temporally and inferotemporally, good retinopexy around superiorly, and retinotomy inferior to nerve, retina attached, nasal detachment completely in place again.  No new PVR seen   ? ?  ?  ? ?  ? ? ?IMAGING AND PROCEDURES  ?Imaging and Procedures for 01/17/22 ? ? ? ?  ?  ? ?  ?ASSESSMENT/PLAN: ? 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 ?  ? ?  ICD-10-CM   ?1. 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  ? ? ?1.  OD looks great today postop day #1 patient to restart medications positioning reviewed ? ?2. ? ?3. ? ?Ophthalmic Meds Ordered this visit:  ?No orders of the defined types were placed in this encounter. ? ? ?  ? ?Return in about 1 week (around 01/24/2022) for COLOR FP, OD, POST OP. ? ?Patient Instructions  ?Ofloxacin  4 times daily to the operative eye ? ?Prednisolone acetate 1 drop to the operative eye 4 times daily ? ?Patient instructed not to refill the medications and use them for maximum of 3 weeks. ? ?Patient instructed do not rub the eye.  Patient has the option to use the patch at night.  ? ?Patient instructed to discontinue the use of ketorolac for now ? ?The patient was found to be doing well, postoperatively, and was advised in the use of drops and home care.  Patient was advised not to rub eyes. Positioning was described as well, as precautions regarding intravitreal gas, if applicable. DO NOT TRAVEL TO [place], OR IN AIRPLANE, UNTIL THE BUBBLE INSIDE THE EYE HAS DISAPPEARED.  The use of eye patch at night is optional and was discussed. May use paper tape with patch if patient has dry skin and transpore tape for oily skin.   Use topical medications as ordered.  ? ?Explained the diagnoses, plan, and follow up with the patient and they expressed understanding.  Patient expressed understanding of the importance of proper follow up care.  ? ?Clent Demark. Lorilei Horan M.D. ?Diseases & Surgery of the Retina and  Vitreous ?[facility] ?01/17/22 ? ? ? ? ?Abbreviations: ?M myopia (nearsighted); A astigmatism; H hyperopia (farsighted); P presbyopia; Mrx spectacle prescription;  CTL contact lenses; OD right e

## 2022-01-23 ENCOUNTER — Ambulatory Visit (INDEPENDENT_AMBULATORY_CARE_PROVIDER_SITE_OTHER): Payer: 59 | Admitting: Ophthalmology

## 2022-01-23 ENCOUNTER — Encounter (INDEPENDENT_AMBULATORY_CARE_PROVIDER_SITE_OTHER): Payer: Self-pay | Admitting: Ophthalmology

## 2022-01-23 DIAGNOSIS — H33011 Retinal detachment with single break, right eye: Secondary | ICD-10-CM | POA: Diagnosis not present

## 2022-01-23 NOTE — Assessment & Plan Note (Signed)
Retina attached OD, with good retinopexy superiorly side of retinal break that was in the nasal retina.  Macula flat.  Temporal retinal remains intact as well.  Macula is attached. ?

## 2022-01-23 NOTE — Progress Notes (Signed)
? ? ?01/23/2022 ? ?  ? ?CHIEF COMPLAINT ?Patient presents for  ?Chief Complaint  ?Patient presents with  ? Post-op Follow-up  ? ? ? ? ?HISTORY OF PRESENT ILLNESS: ?Valerie Heath is a 71 y.o. female who presents to the clinic today for:  ? ?HPI   ? ? Post-op Follow-up   ? ?      ? Laterality: right eye  ? Discomfort: pain and tearing.  Negative for itching and foreign body sensation  ? Vision: is blurred at distance  ? ?  ?  ? ? Comments   ?1 week color FP, OD, Post Op sx 01/16/2022. ?"It is better than last time I saw you, but it stings more and is more sore on the eye itself, and it is watering. The pink top drops stings when I put it in." ?Patient d/c'd Ketorolac, patient is using Tobramycin and Prednisolone QID OD. Patient has been compliant with wearing the eye patch at night for the past week. ? ?  ?  ?Last edited by Laurin Coder on 01/23/2022  8:20 AM.  ?  ? ? ?Referring physician: ?Ginger Organ., MD ?[address] ?[place],  [place] [address] ? ?HISTORICAL INFORMATION:  ? ?Selected notes from the [facility] ?  ?   ? ?CURRENT MEDICATIONS: ?Current Outpatient Medications (Ophthalmic Drugs)  ?Medication Sig  ? ketorolac (ACULAR) 0.5 % ophthalmic solution Place 1 drop into the right eye 4 (four) times daily.  ? prednisoLONE acetate (PRED FORTE) 1 % ophthalmic suspension Place 1 drop into the right eye 4 (four) times daily.  ? tobramycin (TOBREX) 0.3 % ophthalmic solution Place 1 drop into the right eye every 4 (four) hours for 10 days.  ? ?No current facility-administered medications for this visit. (Ophthalmic Drugs)  ? ?Current Outpatient Medications (Other)  ?Medication Sig  ? b complex vitamins capsule Take 1 capsule by mouth daily.  ? Cholecalciferol (VITAMIN D) 50 MCG (2000 UT) CAPS Take 1 capsule (2,000 Units total) by mouth daily.  ? clobetasol ointment (TEMOVATE) 0.05 % Use a pea sized amount topically BID for up to 1-2 weeks as needed for a flare. Not for daily long term use.  ?  levothyroxine (SYNTHROID) 125 MCG tablet Take 1 tablet (125 mcg total) by mouth daily before breakfast.  ? valACYclovir (VALTREX) 500 MG tablet TAKE 4 TABLETS BY MOUTH ON START OF UCLERATION, THEN 4 TABLETS 12 HOURS LATER  ? ?No current facility-administered medications for this visit. (Other)  ? ? ? ? ?REVIEW OF SYSTEMS: ?ROS   ?Negative for: Constitutional, Gastrointestinal, Neurological, Skin, Genitourinary, Musculoskeletal, HENT, Endocrine, Cardiovascular, Eyes, Respiratory, Psychiatric, Allergic/Imm, Heme/Lymph ?Last edited by Hurman Horn, MD on 01/23/2022  8:48 AM.  ?  ? ? ? ?ALLERGIES ?Allergies  ?Allergen Reactions  ? Erythromycin Nausea Only  ? ? ?PAST MEDICAL HISTORY ?Past Medical History:  ?Diagnosis Date  ? Breast cancer ([facility])   ? Breast cancer of upper-outer quadrant of left female breast ([place])   ? Breast cancer of upper-outer quadrant of right female breast ([place])   ? Cancer [facility])   ? breast cancer-lt  ? Fibroid   ? Hypothyroid   ? Personal history of radiation therapy   ? Posterior vitreous detachment of 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  ? Radiation 06/16/14-08/01/14  ? left and right breast   ? 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 Retinal detachment via scleral buckle, cryopexy external drainage subretinal fluid and injection of temporary gas  02/23/2021   right eye  ? ?Past Surgical History:  ?Procedure Laterality Date  ? BREAST BIOPSY    ? BREAST LUMPECTOMY Bilateral   ? 2015  ? BREAST LUMPECTOMY WITH RADIOACTIVE SEED LOCALIZATION Left 02/21/2014  ? Procedure: BREAST LUMPECTOMY WITH RADIOACTIVE SEED LOCALIZATION;  Surgeon: Adin Hector, MD;  Location: [facility];  Service: General;  Laterality: Left;  ? BREAST SURGERY Right 04/2014  ? Lumpectomy   ? BREAST SURGERY Left   ? COLONOSCOPY    ? LYMPH NODE DISSECTION  04/27/14  ? right and left  ? MYOMECTOMY    ? REFRACTIVE SURGERY    ? X 2  ? SHOULDER SURGERY  2005  ? left  ? TONSILLECTOMY AND ADENOIDECTOMY    ? ? ?FAMILY  HISTORY ?Family History  ?Problem Relation Age of Onset  ? Hypertension Mother   ? Breast cancer Mother 107  ? Cancer Mother 45  ?     breast cancer at 26 ; Fallopian tube tumor in her 19s  ? Diabetes Father   ? Hypertension Father   ? Heart disease Father   ? Prostate cancer Father   ? Cancer Father 73  ?     prostate cancer  ? Colon cancer Paternal Uncle   ? Cancer Paternal Uncle 71  ?     colon cancer  ? Throat cancer Maternal Uncle   ? Cancer Maternal Uncle 8  ?     prostate cancer  ? Prostate cancer Maternal Uncle   ? Breast cancer Maternal Aunt 47  ? Leukemia Paternal Grandfather   ? Leukemia Maternal Aunt   ? Cancer Paternal Uncle 37  ?     brain cancer  ? Brain cancer Paternal Uncle   ? ? ?SOCIAL HISTORY ?Social History  ? ?Tobacco Use  ? Smoking status: Never  ? Smokeless tobacco: Never  ?Vaping Use  ? Vaping Use: Never used  ?Substance Use Topics  ? Alcohol use: Yes  ?  Alcohol/week: 2.0 standard drinks  ?  Types: 2 Standard drinks or equivalent per week  ?  Comment: Socially  ? Drug use: No  ? ?  ? ?  ? ?OPHTHALMIC EXAM: ? ?Base Eye Exam   ? ? Visual Acuity (ETDRS)   ? ?   Right Left  ? Dist [place] CF at 2' 20/20 -2  ? Dist ph [place] NI   ? ?  ?  ? ? Tonometry (Tonopen, 8:21 AM)   ? ?   Right Left  ? Pressure 11 14  ? ?  ?  ? ? Pupils   ? ?   Pupils Dark Light React APD  ? Right PERRL 5 5 Minimal None  ? Left PERRL 4 3 Brisk None  ? ?  ?  ? ? Visual Fields   ? ?   Left Right  ?  Full   ? ?  ?  ? ? Extraocular Movement   ? ?   Right Left  ?  Full Full  ? ?  ?  ? ? Neuro/Psych   ? ? Oriented x3: Yes  ? Mood/Affect: Normal  ? ?  ?  ? ? Dilation   ? ? Right eye: 1.0% Mydriacyl, 2.5% Phenylephrine @ 8:21 AM  ? ?  ?  ? ?  ? ?Slit Lamp and Fundus Exam   ? ? External Exam   ? ?   Right Left  ? External Normal   ? ?  ?  ? ?  Slit Lamp Exam   ? ?   Right Left  ? Lids/Lashes 1+ edema   ? Conjunctiva/Sclera 2+ Injection   ? Cornea Clear   ? Anterior Chamber Deep and quiet   ? Iris Round and reactive   ? Lens Centered  posterior chamber intraocular lens   ? Anterior Vitreous Clear   ? ?  ?  ? ? Fundus Exam   ? ?   Right Left  ? Posterior Vitreous Clear, avitric, 75% gas   ? Disc Normal   ? C/D Ratio 0.2   ? Macula Macula attached, macula clearly seen.  No topographic distortion remains no preretinal fibrosis, no cystoid macular edema detected clinically   ? Vessels Normal   ? Periphery Retinopexy surrounding retinotomy inferior, retina attached temporally and inferotemporally, good retinopexy around superiorly, and retinotomy inferior to nerve, retina attached, nasal detachment completely in place again.  No new PVR seen   ? ?  ?  ? ?  ? ? ?IMAGING AND PROCEDURES  ?Imaging and Procedures for 01/23/22 ? ?Color Fundus Photography Optos - OU - Both Eyes   ? ?   ?Right Eye ?Disc findings include normal observations. Vessels : normal observations.  ? ?Left Eye ?Progression has been stable. Disc findings include normal observations. Macula : epiretinal membrane. Vessels : normal observations. Periphery : normal observations.  ? ?Notes ?Clear media, avitric.  Much less macular topographic distortion.  Macula flat.  Good retinopexy temporally, gas, ? ?OS with minor epiretinal membrane superior portion of macula, none foveal distorting.  Incidental PVD OS ? ?  ? ? ?  ?  ? ?  ?ASSESSMENT/PLAN: ? 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  Macula flat.  Temporal retinal remains intact as well.  Macula is attached.  ? ?  ICD-10-CM   ?1. 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  ? ? ?1.  OD doing very well, macular retina attached.  New retinal detachment nasal secure. ? ?2.  OD, continue with topical medications positioning reviewed.  Sleep or lying on back ? ?3.  May resume significant activity with heart rate elevation in 1 week.  She should avoid contact sports  and wear goggles in water or sports or activities with no submersion intended ? ? ?Ophthalmic Meds Ordered this visit:  ?No orders of the defined types were placed in this encounter. ? ? ?  ? ?Return in about 20 days (around

## 2022-01-23 NOTE — Patient Instructions (Addendum)
Ofloxacin  4 times daily to the operative eye ? ?Prednisolone acetate 1 drop to the operative eye 4 times daily ? ?Patient instructed not to refill the medications and use them for maximum of 3 weeks. ? ?Patient instructed do not rub the eye.  Patient has the option to use the patch at night.  ? ?The patient was found to be doing well, postoperatively, and was advised in the use of drops and home care.  Patient was advised not to rub eyes. Positioning was described as well, as precautions regarding intravitreal gas, if applicable. DO NOT TRAVEL TO [place], OR IN AIRPLANE, UNTIL THE BUBBLE INSIDE THE EYE HAS DISAPPEARED.  The use of eye patch at night is optional and was discussed. May use paper tape with patch if patient has dry skin and transpore tape for oily skin.   Use topical medications as ordered.  ? ?May resume significant activity with heart rate elevation in 1 week.  She should avoid contact sports and wear goggles in water or sports or activities with no submersion intended ? ?

## 2022-02-11 ENCOUNTER — Ambulatory Visit (INDEPENDENT_AMBULATORY_CARE_PROVIDER_SITE_OTHER): Payer: 59 | Admitting: Ophthalmology

## 2022-02-11 ENCOUNTER — Encounter (INDEPENDENT_AMBULATORY_CARE_PROVIDER_SITE_OTHER): Payer: Self-pay | Admitting: Ophthalmology

## 2022-02-11 DIAGNOSIS — H35351 Cystoid macular degeneration, right eye: Secondary | ICD-10-CM

## 2022-02-11 DIAGNOSIS — H33011 Retinal detachment with single break, right eye: Secondary | ICD-10-CM | POA: Diagnosis not present

## 2022-02-11 NOTE — Assessment & Plan Note (Signed)
Component of PVR and previous TRD, improved now over the last 4 weeks.  With improved acuity.  We will continue to monitor  Patient is not using topical ketorolac at this time

## 2022-02-11 NOTE — Assessment & Plan Note (Signed)
OD, new retinal break superonasally unrelated to previous PVR and tractional retinal detachment changes temporally.  Retina repaired nicely.  Acuity improving.

## 2022-02-11 NOTE — Progress Notes (Signed)
02/11/2022     CHIEF COMPLAINT Patient presents for  Chief Complaint  Patient presents with   Post-op Follow-up      HISTORY OF PRESENT ILLNESS: Valerie Heath is a 71 y.o. female who presents to the clinic today for:   HPI     Post-op Follow-up           Laterality: right eye   Discomfort: Negative for pain   Vision: is improved         Comments   20 days dilate OD, color fp, OCT, sx 01/16/2022. (3 weeks since sx) "The bubble is smaller. Almost in the center of the bubble it looks like a pool of clear liquid. I see almost double vision I noticed today. Vision is a little clearer, better than what it was." Drops are almost out, still using as of today. Patient is using Prednisolone QID OD, Tobramycin QID OD.      Last edited by Laurin Coder on 02/11/2022  1:04 PM.      Referring physician: Ginger Organ., MD [place],  [place] [address]  HISTORICAL INFORMATION:   Selected notes from the MEDICAL RECORD NUMBER       CURRENT MEDICATIONS: Current Outpatient Medications (Ophthalmic Drugs)  Medication Sig   prednisoLONE acetate (PRED FORTE) 1 % ophthalmic suspension Place 1 drop into the right eye 4 (four) times daily.   No current facility-administered medications for this visit. (Ophthalmic Drugs)   Current Outpatient Medications (Other)  Medication Sig   b complex vitamins capsule Take 1 capsule by mouth daily.   Cholecalciferol (VITAMIN D) 50 MCG (2000 UT) CAPS Take 1 capsule (2,000 Units total) by mouth daily.   clobetasol ointment (TEMOVATE) 0.05 % Use a pea sized amount topically BID for up to 1-2 weeks as needed for a flare. Not for daily long term use.   levothyroxine (SYNTHROID) 125 MCG tablet Take 1 tablet (125 mcg total) by mouth daily before breakfast.   valACYclovir (VALTREX) 500 MG tablet TAKE 4 TABLETS BY MOUTH ON START OF UCLERATION, THEN 4 TABLETS 12 HOURS LATER   No current facility-administered medications for this  visit. (Other)      REVIEW OF SYSTEMS: ROS   Negative for: Constitutional, Gastrointestinal, Neurological, Skin, Genitourinary, Musculoskeletal, HENT, Endocrine, Cardiovascular, Eyes, Respiratory, Psychiatric, Allergic/Imm, Heme/Lymph Last edited by Hurman Horn, MD on 02/11/2022  1:23 PM.       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/2021   Retinal detachment via scleral buckle, cryopexy external drainage subretinal fluid and injection of temporary gas  02/23/2021   right eye   Past Surgical History:  Procedure Laterality Date   BREAST BIOPSY     BREAST LUMPECTOMY Bilateral    2015   BREAST LUMPECTOMY WITH RADIOACTIVE SEED LOCALIZATION Left 02/21/2014   Procedure: BREAST LUMPECTOMY WITH RADIOACTIVE SEED LOCALIZATION;  Surgeon: Edsel Petrin  Dalbert Batman, MD;  Location: [facility];  Service: General;  Laterality: Left;   BREAST SURGERY Right 04/2014   Lumpectomy    BREAST SURGERY Left    COLONOSCOPY     LYMPH NODE DISSECTION  04/27/14   right and left   MYOMECTOMY     REFRACTIVE SURGERY     X 2   SHOULDER SURGERY  2005   left   TONSILLECTOMY AND ADENOIDECTOMY      FAMILY HISTORY Family History  Problem Relation Age of Onset   Hypertension Mother    Breast cancer Mother 93   Cancer Mother 26       breast cancer at 26 ; Fallopian tube tumor in her 76s   Diabetes Father    Hypertension Father    Heart disease Father    Prostate cancer Father     Cancer Father 37       prostate cancer   Colon cancer Paternal Uncle    Cancer Paternal Uncle 7       colon cancer   Throat cancer Maternal Uncle    Cancer Maternal Uncle 45       prostate cancer   Prostate cancer Maternal Uncle    Breast cancer Maternal Aunt 18   Leukemia Paternal Grandfather    Leukemia Maternal Aunt    Cancer Paternal Uncle 31       brain cancer   Brain cancer Paternal Uncle     SOCIAL HISTORY Social History   Tobacco Use   Smoking status: Never   Smokeless tobacco: Never  Vaping Use   Vaping Use: Never used  Substance Use Topics   Alcohol use: Yes    Alcohol/week: 2.0 standard drinks    Types: 2 Standard drinks or equivalent per week    Comment: Socially   Drug use: No         OPHTHALMIC EXAM:  Base Eye Exam     Visual Acuity (ETDRS)       Right Left   Dist [place] 20/60 -2 20/20 -2   Dist ph [place] 20/50 -2          Tonometry (Tonopen, 1:06 PM)       Right Left   Pressure 8 9         Pupils       APD   Right None   Left None         Extraocular Movement       Right Left    Full Full         Neuro/Psych     Oriented x3: Yes   Mood/Affect: Normal         Dilation     Right eye: 1.0% Mydriacyl, 2.5% Phenylephrine @ 1:06 PM           Slit Lamp and Fundus Exam     External Exam       Right Left   External Normal          Slit Lamp Exam       Right Left   Lids/Lashes 1+ edema    Conjunctiva/Sclera 2+ Injection    Cornea Clear    Anterior Chamber Deep and quiet    Iris Round and reactive    Lens Centered posterior chamber intraocular lens    Anterior Vitreous Clear          Fundus Exam       Right Left   Posterior Vitreous Clear, avitric, 20%  gas    Disc Normal    C/D Ratio 0.2    Macula Macula attached, macula clearly seen.  No topographic distortion remains no preretinal fibrosis, no cystoid macular edema detected clinically    Vessels Normal    Periphery Retinopexy surrounding  retinotomy inferior, retina attached temporally and inferotemporally, good retinopexy around superiorly, and retinotomy inferior to nerve, retina attached, prior nasal detachment completely in place again.  No new PVR seen             IMAGING AND PROCEDURES  Imaging and Procedures for 02/11/22  OCT, Retina - OU - Both Eyes       Right Eye Quality was good. Scan locations included subfoveal. Central Foveal Thickness: 364. Progression has worsened. Findings include cystoid macular edema.   Left Eye Quality was good. Scan locations included subfoveal. Central Foveal Thickness: 333. Progression has been stable. Findings include epiretinal membrane.   Notes OD, Macula attached, much less epiretinal tissue over the macular region.  CME OD much improved now.   Epiretinal membrane superior temporal to macula OS, with minor topographic changes, no distortion to fovea OS no impact on acuity     Color Fundus Photography Optos - OU - Both Eyes       Right Eye Disc findings include normal observations. Vessels : normal observations.   Left Eye Progression has been stable. Disc findings include normal observations. Macula : epiretinal membrane. Vessels : normal observations. Periphery : normal observations.   Notes Clear media, avitric.  Much less macular topographic distortion.  Macula flat.  Good retinopexy temporally and superiorly, 20% gas,  OS with minor epiretinal membrane superior portion of macula, none foveal distorting.  Incidental PVD OS             ASSESSMENT/PLAN:  Retinal detachment of right eye with single break OD, new retinal break superonasally unrelated to previous PVR and tractional retinal detachment changes temporally.  Retina repaired nicely.  Acuity improving.  Cystoid macular edema of right eye Component of PVR and previous TRD, improved now over the last 4 weeks.  With improved acuity.  We will continue to monitor  Patient is not using topical  ketorolac at this time     ICD-10-CM   1. 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 Cystoid macular edema of right eye  H35.351       1.  2.  3.  Ophthalmic Meds Ordered this visit:  No orders of the defined types were placed in this encounter.      Return in about 5 weeks (around 03/18/2022) for DILATE OU, COLOR FP, OCT.  Patient Instructions  Discontinue the white Top bottle.  Taper Pred forte (Pink top) from 4 times daily now to only 1 drop once a day for the next 3 days then discontinue.   Patient to maintain postoperative positioning meaning do not sleep or lie flat on back for extended periods of time while the gas bubbles in the eye.  The brace until the gas bubble resolves.  Explained the diagnoses, plan, and follow up with the patient and they expressed understanding.  Patient expressed understanding of the importance of proper follow up care.   Clent Demark Aliz Meritt M.D. Diseases & Surgery of the Retina and Vitreous Retina & Diabetic [place] 02/11/22     Abbreviations: M myopia (nearsighted); A  astigmatism; H hyperopia (farsighted); P presbyopia; Mrx spectacle prescription;  CTL contact lenses; OD right eye; OS left eye; OU both eyes  XT exotropia; ET esotropia; PEK punctate epithelial keratitis; PEE punctate epithelial erosions; DES dry eye syndrome; MGD meibomian gland dysfunction; ATs artificial tears; PFAT's preservative free artificial tears; [place] nuclear sclerotic cataract; PSC posterior subcapsular cataract; ERM epi-retinal membrane; PVD posterior vitreous detachment; RD retinal detachment; DM diabetes mellitus; DR diabetic retinopathy; NPDR non-proliferative diabetic retinopathy; PDR proliferative diabetic retinopathy; CSME clinically significant macular edema; DME diabetic macular edema; dbh dot blot hemorrhages; CWS cotton wool spot; POAG primary open angle  glaucoma; C/D cup-to-disc ratio; HVF humphrey visual field; GVF goldmann visual field; OCT optical coherence tomography; IOP intraocular pressure; BRVO Branch retinal vein occlusion; CRVO central retinal vein occlusion; CRAO central retinal artery occlusion; BRAO branch retinal artery occlusion; RT retinal tear; SB scleral buckle; PPV pars plana vitrectomy; VH Vitreous hemorrhage; PRP panretinal laser photocoagulation; IVK intravitreal kenalog; VMT vitreomacular traction; MH Macular hole;  NVD neovascularization of the disc; NVE neovascularization elsewhere; AREDS age related eye disease study; ARMD age related macular degeneration; POAG primary open angle glaucoma; EBMD epithelial/anterior basement membrane dystrophy; ACIOL anterior chamber intraocular lens; IOL intraocular lens; PCIOL posterior chamber intraocular lens; Phaco/IOL phacoemulsification with intraocular lens placement; PRK photorefractive keratectomy; LASIK laser assisted in situ keratomileusis; HTN hypertension; DM diabetes mellitus; COPD chronic obstructive pulmonary disease   02/11/2022     CHIEF COMPLAINT Patient presents for  Chief Complaint  Patient presents with   Post-op Follow-up      HISTORY OF PRESENT ILLNESS: Valerie Heath is a 71 y.o. female who presents to the clinic today for:   HPI     Post-op Follow-up           Laterality: right eye   Discomfort: Negative for pain   Vision: is improved         Comments   20 days dilate OD, color fp, OCT, sx 01/16/2022. (3 weeks since sx) "The bubble is smaller. Almost in the center of the bubble it looks like a pool of clear liquid. I see almost double vision I noticed today. Vision is a little clearer, better than what it was." Drops are almost out, still using as of today. Patient is using Prednisolone QID OD, Tobramycin QID OD.      Last edited by Laurin Coder on 02/11/2022  1:04 PM.      Referring physician: Ginger Organ., MD [place],  [place] [address]  HISTORICAL INFORMATION:   Selected notes from the MEDICAL RECORD NUMBER       CURRENT MEDICATIONS: Current Outpatient Medications (Ophthalmic Drugs)  Medication Sig   prednisoLONE acetate (PRED FORTE) 1 % ophthalmic suspension Place 1 drop into the right eye 4 (four) times daily.   No current facility-administered medications for this visit. (Ophthalmic Drugs)   Current Outpatient Medications (Other)  Medication Sig   b complex vitamins capsule Take 1 capsule by mouth daily.   Cholecalciferol (VITAMIN D) 50 MCG (2000 UT) CAPS Take 1 capsule (2,000 Units total) by mouth daily.   clobetasol ointment (TEMOVATE) 0.05 % Use a pea sized amount topically BID for up to 1-2 weeks as needed for a flare. Not for daily long term use.   levothyroxine (SYNTHROID) 125 MCG tablet Take 1 tablet (125 mcg total) by mouth daily before breakfast.   valACYclovir (VALTREX) 500 MG tablet TAKE 4 TABLETS BY MOUTH ON START OF UCLERATION, THEN 4 TABLETS  12 HOURS LATER   No current facility-administered medications for this visit. (Other)      REVIEW OF SYSTEMS: ROS   Negative for: Constitutional, Gastrointestinal, Neurological, Skin, Genitourinary, Musculoskeletal, HENT, Endocrine, Cardiovascular, Eyes, Respiratory, Psychiatric, Allergic/Imm, Heme/Lymph Last edited by Hurman Horn, MD on 02/11/2022  1:23 PM.       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  02/22/2021   Retinal detachment via scleral buckle, cryopexy external drainage subretinal fluid and injection of temporary gas  02/23/2021   right eye   Past Surgical History:  Procedure Laterality Date   BREAST BIOPSY     BREAST LUMPECTOMY Bilateral    2015   BREAST LUMPECTOMY WITH RADIOACTIVE SEED LOCALIZATION Left 02/21/2014   Procedure: BREAST LUMPECTOMY WITH RADIOACTIVE SEED LOCALIZATION;  Surgeon: Adin Hector, MD;  Location: [facility];  Service: General;  Laterality: Left;   BREAST SURGERY Right 04/2014   Lumpectomy    BREAST SURGERY Left    COLONOSCOPY     LYMPH NODE DISSECTION  04/27/14   right and left   MYOMECTOMY     REFRACTIVE SURGERY     X 2   SHOULDER SURGERY  2005   left   TONSILLECTOMY AND ADENOIDECTOMY      FAMILY HISTORY Family History  Problem Relation Age of Onset   Hypertension Mother    Breast cancer Mother 84   Cancer Mother 20       breast cancer at 1 ; Fallopian tube tumor in her 63s   Diabetes Father    Hypertension Father    Heart disease Father    Prostate cancer Father    Cancer Father 67       prostate cancer   Colon cancer Paternal Uncle    Cancer Paternal Uncle 24       colon cancer   Throat cancer Maternal Uncle    Cancer Maternal Uncle 45       prostate cancer   Prostate cancer Maternal Uncle    Breast cancer Maternal Aunt 8   Leukemia Paternal Grandfather    Leukemia Maternal Aunt    Cancer Paternal Uncle 50       brain cancer   Brain cancer Paternal Uncle     SOCIAL HISTORY Social History   Tobacco Use   Smoking status: Never   Smokeless tobacco: Never  Vaping Use   Vaping Use: Never used  Substance Use Topics   Alcohol use: Yes    Alcohol/week: 2.0 standard drinks    Types: 2 Standard drinks or equivalent per week    Comment: Socially   Drug use: No         OPHTHALMIC EXAM:  Base Eye Exam     Visual Acuity (ETDRS)       Right Left   Dist  20/60 -2 20/20 -2   Dist ph  20/50 -2  Tonometry (Tonopen, 1:06 PM)       Right Left   Pressure 8 9         Pupils       APD   Right None   Left None         Extraocular Movement       Right Left    Full Full         Neuro/Psych     Oriented x3: Yes   Mood/Affect: Normal         Dilation     Right eye: 1.0% Mydriacyl, 2.5% Phenylephrine @ 1:06 PM           Slit Lamp and Fundus Exam     External Exam       Right Left   External Normal          Slit Lamp Exam       Right Left   Lids/Lashes 1+ edema    Conjunctiva/Sclera 2+ Injection    Cornea Clear    Anterior Chamber Deep and quiet    Iris Round and reactive    Lens Centered posterior chamber intraocular lens    Anterior Vitreous Clear          Fundus Exam       Right Left   Posterior Vitreous Clear, avitric, 20% gas    Disc Normal    C/D Ratio 0.2    Macula Macula attached, macula clearly seen.  No topographic distortion remains no preretinal fibrosis, no cystoid macular edema detected clinically    Vessels Normal    Periphery Retinopexy surrounding retinotomy inferior, retina attached temporally and inferotemporally, good retinopexy around superiorly, and retinotomy inferior to nerve, retina attached, prior nasal detachment completely in place again.  No new PVR seen             IMAGING AND PROCEDURES  Imaging and Procedures for 02/11/22  OCT, Retina - OU - Both Eyes       Right Eye Quality was good. Scan locations included subfoveal. Central Foveal Thickness: 364. Progression has worsened. Findings include cystoid macular edema.   Left Eye Quality was good. Scan locations included subfoveal. Central Foveal Thickness: 333. Progression has been stable. Findings include epiretinal membrane.   Notes OD, Macula attached, much less epiretinal tissue over the macular region.  CME OD much improved now.   Epiretinal membrane superior temporal to macula OS, with minor topographic changes, no distortion to fovea  OS no impact on acuity     Color Fundus Photography Optos - OU - Both Eyes       Right Eye Disc findings include normal observations. Vessels : normal observations.   Left Eye Progression has been stable. Disc findings include normal observations. Macula : epiretinal membrane. Vessels : normal observations. Periphery : normal observations.   Notes Clear media, avitric.  Much less macular topographic distortion.  Macula flat.  Good retinopexy temporally and superiorly, 20% gas,  OS with minor epiretinal membrane superior portion of macula, none foveal distorting.  Incidental PVD OS             ASSESSMENT/PLAN:  Retinal detachment of right eye with single break OD, new retinal break superonasally unrelated to previous PVR and tractional retinal detachment changes temporally.  Retina repaired nicely.  Acuity improving.  Cystoid macular edema of right eye Component of PVR and previous TRD, improved now over the last 4 weeks.  With improved acuity.  We will continue to monitor  Patient  is not using topical ketorolac at this time      ICD-10-CM   1. 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 Cystoid macular edema of right eye  H35.351       1.  Retinal detachment repair superonasally OD.  Retina in great position.  Visual acuity improving.  CME also responding to the healing process and improving not on NSAIDs topically.  Current Pred forte 1 drop 4 times daily will taper to once daily for 3 days prior to discontinuation  2.  3.  Ophthalmic Meds Ordered this visit:  No orders of the defined types were placed in this encounter.      Return in about 5 weeks (around 03/18/2022) for DILATE OU, COLOR FP, OCT.  Patient Instructions  Discontinue the white Top bottle.  Taper Pred forte (Pink top) from 4 times daily now to only 1 drop once a day for the next 3 days then  discontinue.   Patient to maintain postoperative positioning meaning do not sleep or lie flat on back for extended periods of time while the gas bubbles in the eye.  The brace until the gas bubble resolves.  Explained the diagnoses, plan, and follow up with the patient and they expressed understanding.  Patient expressed understanding of the importance of proper follow up care.   Clent Demark Valerie Heath M.D. Diseases & Surgery of the Retina and Vitreous Retina & Diabetic [place] 02/11/22     Abbreviations: M myopia (nearsighted); A astigmatism; H hyperopia (farsighted); P presbyopia; Mrx spectacle prescription;  CTL contact lenses; OD right eye; OS left eye; OU both eyes  XT exotropia; ET esotropia; PEK punctate epithelial keratitis; PEE punctate epithelial erosions; DES dry eye syndrome; MGD meibomian gland dysfunction; ATs artificial tears; PFAT's preservative free artificial tears; [place] nuclear sclerotic cataract; PSC posterior subcapsular cataract; ERM epi-retinal membrane; PVD posterior vitreous detachment; RD retinal detachment; DM diabetes mellitus; DR diabetic retinopathy; NPDR non-proliferative diabetic retinopathy; PDR proliferative diabetic retinopathy; CSME clinically significant macular edema; DME diabetic macular edema; dbh dot blot hemorrhages; CWS cotton wool spot; POAG primary open angle glaucoma; C/D cup-to-disc ratio; HVF humphrey visual field; GVF goldmann visual field; OCT optical coherence tomography; IOP intraocular pressure; BRVO Branch retinal vein occlusion; CRVO central retinal vein occlusion; CRAO central retinal artery occlusion; BRAO branch retinal artery occlusion; RT retinal tear; SB scleral buckle; PPV pars plana vitrectomy; VH Vitreous hemorrhage; PRP panretinal laser photocoagulation; IVK intravitreal kenalog; VMT vitreomacular traction; MH Macular hole;  NVD neovascularization of the disc; NVE neovascularization elsewhere; AREDS age related eye disease study; ARMD age  related macular degeneration; POAG primary open angle glaucoma; EBMD epithelial/anterior basement membrane dystrophy; ACIOL anterior chamber intraocular lens; IOL intraocular lens; PCIOL posterior chamber intraocular lens; Phaco/IOL phacoemulsification with intraocular lens placement; [place] photorefractive keratectomy; LASIK laser assisted in situ keratomileusis; HTN hypertension; DM diabetes mellitus; COPD chronic obstructive pulmonary disease

## 2022-02-11 NOTE — Patient Instructions (Addendum)
Discontinue the white Top bottle.  Taper Pred forte (Pink top) from 4 times daily now to only 1 drop once a day for the next 3 days then discontinue.   Patient to maintain postoperative positioning meaning do not sleep or lie flat on back for extended periods of time while the gas bubbles in the eye.  The brace until the gas bubble resolves.

## 2022-02-12 ENCOUNTER — Encounter (INDEPENDENT_AMBULATORY_CARE_PROVIDER_SITE_OTHER): Payer: 59 | Admitting: Ophthalmology

## 2022-02-12 ENCOUNTER — Telehealth (INDEPENDENT_AMBULATORY_CARE_PROVIDER_SITE_OTHER): Payer: Self-pay

## 2022-02-12 NOTE — Telephone Encounter (Signed)
Pt called and asked if it was okay to go forward with mammogram appointment on Friday. Pt had nitrogen bubble in surgical eye and was wondering if that would be a problem.  I called patient back and informed patient it is okay to go forward with the appointment. Dr Zadie Rhine advised patient to stand straight up for the mammogram instead of sitting down. Patient was responsive and agreed.

## 2022-02-15 ENCOUNTER — Ambulatory Visit: Payer: 59

## 2022-02-21 ENCOUNTER — Ambulatory Visit
Admission: RE | Admit: 2022-02-21 | Discharge: 2022-02-21 | Disposition: A | Discharge status: Home / Self Care | Payer: 59 | Source: Ambulatory Visit | Attending: Obstetrics and Gynecology | Admitting: Obstetrics and Gynecology

## 2022-02-21 DIAGNOSIS — Z1231 Encounter for screening mammogram for malignant neoplasm of breast: Secondary | ICD-10-CM

## 2022-03-18 ENCOUNTER — Ambulatory Visit (INDEPENDENT_AMBULATORY_CARE_PROVIDER_SITE_OTHER): Payer: 59 | Admitting: Ophthalmology

## 2022-03-18 ENCOUNTER — Encounter (INDEPENDENT_AMBULATORY_CARE_PROVIDER_SITE_OTHER): Payer: Self-pay | Admitting: Ophthalmology

## 2022-03-18 DIAGNOSIS — H3341 Traction detachment of retina, right eye: Secondary | ICD-10-CM

## 2022-03-18 DIAGNOSIS — H33011 Retinal detachment with single break, right eye: Secondary | ICD-10-CM

## 2022-03-18 DIAGNOSIS — H2512 Age-related nuclear cataract, left eye: Secondary | ICD-10-CM

## 2022-03-18 DIAGNOSIS — H43812 Vitreous degeneration, left eye: Secondary | ICD-10-CM

## 2022-03-18 DIAGNOSIS — H35372 Puckering of macula, left eye: Secondary | ICD-10-CM

## 2022-03-18 NOTE — Assessment & Plan Note (Signed)
Minor impact on acuity

## 2022-03-20 ENCOUNTER — Encounter (INDEPENDENT_AMBULATORY_CARE_PROVIDER_SITE_OTHER): Payer: Self-pay

## 2022-06-28 ENCOUNTER — Ambulatory Visit: Payer: 59 | Admitting: Hematology and Oncology

## 2022-07-09 NOTE — Progress Notes (Signed)
8:00   Patient Care Team: Ginger Organ., MD as PCP - General (Internal Medicine)  DIAGNOSIS:  Encounter Diagnoses  Name Primary?   Malignant neoplasm of upper-outer quadrant of left breast in female, estrogen receptor positive ([place])    Malignant neoplasm of upper-outer quadrant of right breast in female, estrogen receptor positive ([place])     SUMMARY OF ONCOLOGIC HISTORY: Oncology History  Breast cancer of upper-outer quadrant of right female breast ([place])  03/29/2014 Initial Diagnosis   MRI finding of suspicious changes   04/27/2014 Surgery   Right breast lumpectomy: Fibrocystic changes; 5 sentinel lymph nodes negative   Breast cancer of upper-outer quadrant of left female breast ([place])  03/29/2014 Initial Diagnosis   Breast cancer of upper-outer quadrant of left female breast   04/27/2014 Surgery   Left breast lumpectomy: Invasive ductal carcinoma grade 1/3; 1.5 cm with DCIS, ER 100%, PR 53%, Ki-67 8%, HER-2 negative ratio 1.21   06/14/2014 - 08/01/2014 Radiation Therapy   Adjuvant radiation therapy   07/26/2014 Procedure   [place]. Genetic testing was normal, and did not reveal a deleterious mutation in these genes. A complete list of all genes tested is located on the test report scanned into EPIC.     08/02/2014 -  Anti-estrogen oral therapy   Arimidex 1 mg daily X 5 years     CHIEF COMPLIANT: Follow-up on left breast cancer  INTERVAL HISTORY: Valerie Heath is a 71 y.o. with above-mentioned history of left breast cancer underwent lumpectomy, radiation, and who is currently on surveillance. She presents to the clinic for a follow-up. She reports eye and throat issues. She denies difficult with eating. She denies any lumps or nodules in the breast.   ALLERGIES:  is allergic to erythromycin.  MEDICATIONS:  Current Outpatient Medications  Medication Sig Dispense Refill   b complex vitamins capsule Take 1 capsule by mouth daily.     Cholecalciferol  (VITAMIN D) 50 MCG (2000 UT) CAPS Take 1 capsule (2,000 Units total) by mouth daily. 30 capsule    clobetasol ointment (TEMOVATE) 0.05 % Use a pea sized amount topically BID for up to 1-2 weeks as needed for a flare. Not for daily long term use. 60 g 0   levothyroxine (SYNTHROID) 125 MCG tablet Take 1 tablet (125 mcg total) by mouth daily before breakfast.     prednisoLONE acetate (PRED FORTE) 1 % ophthalmic suspension Place 1 drop into the right eye 4 (four) times daily. 10 mL 0   valACYclovir (VALTREX) 500 MG tablet TAKE 4 TABLETS BY MOUTH ON START OF UCLERATION, THEN 4 TABLETS 12 HOURS LATER  3   No current facility-administered medications for this visit.    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 Oncotype DX recurrence score was 8 which translates into a 6% risk of distant recurrence  Status post radiation therapy completed November 2015, started anastrozole 08/02/2014 completed October 2022   Breast Cancer Surveillance: 1. Breast exam 07/12/2022:  Benign.  Radiation related skin damage in the left breast which is chronic and unchanged. 2. Mammogram 02/22/2022: Benign breast density category C   Patient would like to see [place] once a year for follow-ups.  Breast cancer of upper-outer quadrant of right female breast ([place]) 04/27/2014:Right breast DCIS ER/PR positive: status post lumpectomy ER/PR positive: Completed anastrozole  therapy    No orders of the defined types were placed in this encounter.  The patient has a good understanding of the overall plan. she agrees with it. she will call with any problems that may develop before the next visit here. Total time spent: 30 mins including face to face time and time spent for planning, charting and co-ordination of care   Valerie Ohara, MD 07/12/22    I [name] am scribing for Dr. [name]  I have reviewed the above documentation for accuracy and completeness, and I agree with the above.

## 2022-07-10 ENCOUNTER — Ambulatory Visit: Payer: 59 | Admitting: Obstetrics and Gynecology

## 2022-07-11 ENCOUNTER — Ambulatory Visit: Payer: 59 | Admitting: Hematology and Oncology

## 2022-07-11 ENCOUNTER — Encounter (INDEPENDENT_AMBULATORY_CARE_PROVIDER_SITE_OTHER): Payer: 59 | Admitting: Ophthalmology

## 2022-07-12 ENCOUNTER — Other Ambulatory Visit: Payer: Self-pay

## 2022-07-12 ENCOUNTER — Inpatient Hospital Stay: Payer: 59 | Attending: Hematology and Oncology | Admitting: Hematology and Oncology

## 2022-07-12 DIAGNOSIS — Z923 Personal history of irradiation: Secondary | ICD-10-CM | POA: Diagnosis not present

## 2022-07-12 DIAGNOSIS — C50411 Malignant neoplasm of upper-outer quadrant of right female breast: Secondary | ICD-10-CM

## 2022-07-12 DIAGNOSIS — Y842 Radiological procedure and radiotherapy as the cause of abnormal reaction of the patient, or of later complication, without mention of misadventure at the time of the procedure: Secondary | ICD-10-CM | POA: Diagnosis not present

## 2022-07-12 DIAGNOSIS — Z170 Estrogen receptor positive status [ER+]: Secondary | ICD-10-CM | POA: Diagnosis not present

## 2022-07-12 DIAGNOSIS — Z853 Personal history of malignant neoplasm of breast: Secondary | ICD-10-CM | POA: Diagnosis present

## 2022-07-12 DIAGNOSIS — C50412 Malignant neoplasm of upper-outer quadrant of left female breast: Secondary | ICD-10-CM

## 2022-07-12 NOTE — Assessment & Plan Note (Signed)
04/27/2014:Right breast DCIS ER/PR positive: status post lumpectomy ER/PR positive: Completed anastrozole therapy

## 2022-07-12 NOTE — Assessment & Plan Note (Addendum)
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 Oncotype DX recurrence score was 8 which translates into a 6% risk of distant recurrence Status post radiation therapy completed November 2015, started anastrozole 08/02/2014 completed October 2022  Breast Cancer Surveillance: 1. Breast exam10/20/2023:  Benign. 2. Mammogram6/10/2021:Benign breast density category C  Patient would like to see [place] once a year for follow-ups.

## 2022-07-18 ENCOUNTER — Encounter (INDEPENDENT_AMBULATORY_CARE_PROVIDER_SITE_OTHER): Payer: 59 | Admitting: Ophthalmology

## 2022-07-18 ENCOUNTER — Ambulatory Visit: Payer: 59 | Admitting: Obstetrics and Gynecology

## 2022-07-23 NOTE — Progress Notes (Signed)
71 y.o. G0P0 Married White or Caucasian Not Hispanic or Latino female here for annual exam.  No vaginal bleeding. Sexually active without penetration.   Normal bowel and bladder function.    H/O lichen sclerosis, uses steroid ointment prn flare. Occurs every few months.   H/O bilateral breast cancer. She had bilateral lumpectomies and radiation. Then treated with Arimidex, went off of it last year.    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 Margins were negative.   No LMP recorded. Patient is postmenopausal.          Sexually active: Yes.    The current method of family planning is post menopausal status.    Exercising: No.  The patient does not participate in regular exercise at present. Smoker:  no  Health Maintenance: Pap:  07-04-20 negative            06-26-18 negative, HR HPV negative  History of abnormal Pap:  no MMG:  02/22/22 density C Bi-rads 1 neg  BMD:   09-2020 with PCP  Colonoscopy: 2020 Dr. Collene Mares, f/u 7 years  TDaP:  UTD per patient Gardasil: no   reports that she has never smoked. She has never used smokeless tobacco. She reports current alcohol use of about 2.0 standard drinks of alcohol per week. She reports that she does not use drugs.  Retired, worked in a lab.    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 02/22/2021   Retinal detachment via scleral buckle, cryopexy external drainage subretinal fluid and injection of temporary gas  02/23/2021   right eye    Past Surgical History:  Procedure Laterality Date   BREAST BIOPSY     BREAST  LUMPECTOMY Bilateral    2015   BREAST LUMPECTOMY WITH RADIOACTIVE SEED LOCALIZATION Left 02/21/2014   Procedure: BREAST LUMPECTOMY WITH RADIOACTIVE SEED LOCALIZATION;  Surgeon: Adin Hector, MD;  Location: [facility];  Service: General;  Laterality: Left;   BREAST SURGERY Right 04/2014   Lumpectomy    BREAST SURGERY Left    COLONOSCOPY     LYMPH NODE DISSECTION  04/27/14   right and left   MYOMECTOMY     REFRACTIVE SURGERY     X 2   SHOULDER SURGERY  2005   left   TONSILLECTOMY AND ADENOIDECTOMY      Current Outpatient Medications  Medication Sig Dispense Refill   b complex vitamins capsule Take 1 capsule by mouth daily.     Cholecalciferol (VITAMIN D) 50 MCG (2000 UT) CAPS Take 1 capsule (2,000 Units total) by mouth daily. 30 capsule    clobetasol ointment (TEMOVATE) 0.05 % Use a pea sized amount topically BID for up to 1-2 weeks as needed for a flare. Not for daily long term use. 60 g 0   levothyroxine (SYNTHROID) 125 MCG tablet Take 1 tablet (125 mcg total) by mouth daily before breakfast.     valACYclovir (VALTREX) 500 MG tablet TAKE 4 TABLETS BY MOUTH ON START OF Darrin Luis,  THEN 4 TABLETS 12 HOURS LATER  3   No current facility-administered medications for this visit.    Family History  Problem Relation Age of Onset   Hypertension Mother    Breast cancer Mother 59   Cancer Mother 5       breast cancer at 40 ; Fallopian tube tumor in her 25s   Diabetes Father    Hypertension Father    Heart disease Father    Prostate cancer Father    Cancer Father 85       prostate cancer   Colon cancer Paternal Uncle    Cancer Paternal Uncle 51       colon cancer   Throat cancer Maternal Uncle    Cancer Maternal Uncle 45       prostate cancer   Prostate cancer Maternal Uncle    Breast cancer Maternal Aunt 62   Leukemia Paternal Grandfather    Leukemia Maternal Aunt    Cancer Paternal Uncle 10       brain cancer   Brain cancer Paternal Uncle     Review of  Systems  All other systems reviewed and are negative.   Exam:   BP 110/74   Pulse 66   Ht '5\' 4"'$  (1.626 m)   Wt 150 lb (68 kg)   SpO2 100%   BMI 25.75 kg/m   Weight change: '@WEIGHTCHANGE'$ @ Height:   Height: '5\' 4"'$  (162.6 cm)  Ht Readings from Last 3 Encounters:  07/30/22 '5\' 4"'$  (1.626 m)  07/12/22 5' 3.5" (1.613 m)  07/05/21 5' 3.5" (1.613 m)    General appearance: alert, cooperative and appears stated age Head: Normocephalic, without obvious abnormality, atraumatic Neck: no adenopathy, supple, symmetrical, trachea midline and thyroid normal to inspection and palpation Lungs: clear to auscultation bilaterally Cardiovascular: regular rate and rhythm Breasts: normal appearance, no masses or tenderness, evidence of bilateral lumpectomies and radiation Abdomen: soft, non-tender; non distended,  no masses,  no organomegaly Extremities: extremities normal, atraumatic, no cyanosis or edema Skin: Skin color, texture, turgor normal. No rashes or lesions Lymph nodes: Cervical, supraclavicular, and axillary nodes normal. No abnormal inguinal nodes palpated Neurologic: Grossly normal   Pelvic: External genitalia:  no lesions, mild agglutination of labia minora to majora, mild whitening in the posterior fourchette              Urethra:  normal appearing urethra with no masses, tenderness or lesions              Bartholins and Skenes: normal                 Vagina: atrophic appearing vagina with normal color and discharge, no lesions              Cervix: no lesions               Bimanual Exam:  Uterus:  normal size, contour, position, consistency, mobility, non-tender              Adnexa: no mass, fullness, tenderness               Rectovaginal: Confirms               Anus:  normal sphincter tone, no lesions  Gae Dry, CMA chaperoned for the exam.  1. Encounter for breast and pelvic examination No pap needed Mammogram and colonoscopy are UTD Labs with primary DEXA with primary  2.  History of breast cancer  3. Lichen sclerosus - clobetasol  ointment (TEMOVATE) 0.05 %; Use a pea sized amount topically BID for up to 1-2 weeks as needed for a flare. Not for daily long term use.  Dispense: 60 g; Refill: 0

## 2022-07-30 ENCOUNTER — Ambulatory Visit (INDEPENDENT_AMBULATORY_CARE_PROVIDER_SITE_OTHER): Payer: 59 | Admitting: Obstetrics and Gynecology

## 2022-07-30 ENCOUNTER — Encounter: Payer: Self-pay | Admitting: Obstetrics and Gynecology

## 2022-07-30 VITALS — BP 110/74 | HR 66 | Ht 64.0 in | Wt 150.0 lb

## 2022-07-30 DIAGNOSIS — Z01419 Encounter for gynecological examination (general) (routine) without abnormal findings: Secondary | ICD-10-CM

## 2022-07-30 DIAGNOSIS — Z853 Personal history of malignant neoplasm of breast: Secondary | ICD-10-CM

## 2022-07-30 DIAGNOSIS — L900 Lichen sclerosus et atrophicus: Secondary | ICD-10-CM

## 2022-07-30 MED ORDER — CLOBETASOL PROPIONATE 0.05 % EX OINT: TOPICAL_OINTMENT | CUTANEOUS | 0 refills | Status: DC

## 2023-01-20 ENCOUNTER — Other Ambulatory Visit: Payer: Self-pay | Admitting: Obstetrics and Gynecology

## 2023-01-20 DIAGNOSIS — Z0000 Encounter for general adult medical examination without abnormal findings: Secondary | ICD-10-CM

## 2023-02-24 ENCOUNTER — Ambulatory Visit
Admission: RE | Admit: 2023-02-24 | Discharge: 2023-02-24 | Disposition: A | Discharge status: Home / Self Care | Payer: 59 | Source: Ambulatory Visit | Attending: Obstetrics and Gynecology | Admitting: Obstetrics and Gynecology

## 2023-02-24 DIAGNOSIS — Z0000 Encounter for general adult medical examination without abnormal findings: Secondary | ICD-10-CM

## 2023-03-30 ENCOUNTER — Emergency Department (HOSPITAL_COMMUNITY)
Admission: EM | Admit: 2023-03-30 | Discharge: 2023-03-30 | Disposition: A | Discharge status: Home / Self Care | Payer: 59 | Attending: Emergency Medicine | Admitting: Emergency Medicine

## 2023-03-30 ENCOUNTER — Emergency Department (HOSPITAL_COMMUNITY): Payer: 59

## 2023-03-30 ENCOUNTER — Other Ambulatory Visit: Payer: Self-pay

## 2023-03-30 ENCOUNTER — Encounter (HOSPITAL_COMMUNITY): Payer: Self-pay | Admitting: *Deleted

## 2023-03-30 DIAGNOSIS — R0789 Other chest pain: Secondary | ICD-10-CM | POA: Insufficient documentation

## 2023-03-30 DIAGNOSIS — Z79899 Other long term (current) drug therapy: Secondary | ICD-10-CM | POA: Diagnosis not present

## 2023-03-30 DIAGNOSIS — Y92410 Unspecified street and highway as the place of occurrence of the external cause: Secondary | ICD-10-CM | POA: Insufficient documentation

## 2023-03-30 DIAGNOSIS — Z853 Personal history of malignant neoplasm of breast: Secondary | ICD-10-CM | POA: Diagnosis not present

## 2023-03-30 DIAGNOSIS — S99911A Unspecified injury of right ankle, initial encounter: Secondary | ICD-10-CM | POA: Diagnosis present

## 2023-03-30 DIAGNOSIS — M542 Cervicalgia: Secondary | ICD-10-CM | POA: Diagnosis not present

## 2023-03-30 DIAGNOSIS — E039 Hypothyroidism, unspecified: Secondary | ICD-10-CM | POA: Insufficient documentation

## 2023-03-30 LAB — COMPREHENSIVE METABOLIC PANEL
ALT: 45 U/L — ABNORMAL HIGH (ref 0–44)
AST: 61 U/L — ABNORMAL HIGH (ref 15–41)
Albumin: 3.7 g/dL (ref 3.5–5.0)
Alkaline Phosphatase: 58 U/L (ref 38–126)
Anion gap: 15 (ref 5–15)
BUN: 14 mg/dL (ref 8–23)
CO2: 25 mmol/L (ref 22–32)
Calcium: 9.1 mg/dL (ref 8.9–10.3)
Chloride: 100 mmol/L (ref 98–111)
Creatinine, Ser: 0.74 mg/dL (ref 0.44–1.00)
GFR, Estimated: >60 mL/min (ref >60)
Glucose, Bld: 115 mg/dL — ABNORMAL HIGH (ref 70–99)
Potassium: 4 mmol/L (ref 3.5–5.1)
Sodium: 140 mmol/L (ref 135–145)
Total Bilirubin: 0.8 mg/dL (ref 0.3–1.2)
Total Protein: 6.5 g/dL (ref 6.5–8.1)

## 2023-03-30 LAB — CBC
HCT: 40.2 % (ref 36.0–46.0)
Hemoglobin: 13.5 g/dL (ref 12.0–15.0)
MCH: 31.5 pg (ref 26.0–34.0)
MCHC: 33.6 g/dL (ref 30.0–36.0)
MCV: 93.9 fL (ref 80.0–100.0)
Platelets: 231 10*3/uL (ref 150–400)
RBC: 4.28 MIL/uL (ref 3.87–5.11)
RDW: 13.2 % (ref 11.5–15.5)
WBC: 9.7 10*3/uL (ref 4.0–10.5)
nRBC: 0 % (ref 0.0–0.2)

## 2023-03-30 LAB — I-STAT CHEM 8, ED
BUN: 15 mg/dL (ref 8–23)
Calcium, Ion: 1.18 mmol/L (ref 1.15–1.40)
Chloride: 103 mmol/L (ref 98–111)
Creatinine, Ser: 0.8 mg/dL (ref 0.44–1.00)
Glucose, Bld: 111 mg/dL — ABNORMAL HIGH (ref 70–99)
HCT: 43 % (ref 36.0–46.0)
Hemoglobin: 14.6 g/dL (ref 12.0–15.0)
Potassium: 3.9 mmol/L (ref 3.5–5.1)
Sodium: 138 mmol/L (ref 135–145)
TCO2: 25 mmol/L (ref 22–32)

## 2023-03-30 MED ORDER — IOHEXOL 350 MG/ML SOLN
75.0000 mL | Freq: Once | INTRAVENOUS | Status: AC | PRN
  Administered 2023-03-30: 75 mL via INTRAVENOUS

## 2023-03-30 MED ORDER — OXYCODONE-ACETAMINOPHEN 5-325 MG PO TABS: 1.0000 | ORAL_TABLET | Freq: Three times a day (TID) | ORAL | 0 refills | Status: AC | PRN

## 2023-03-30 MED ORDER — KETOROLAC TROMETHAMINE 15 MG/ML IJ SOLN
15.0000 mg | Freq: Once | INTRAMUSCULAR | Status: AC
  Administered 2023-03-30: 15 mg via INTRAVENOUS
  Filled 2023-03-30: qty 1

## 2023-03-30 MED ORDER — FENTANYL CITRATE PF 50 MCG/ML IJ SOSY
50.0000 ug | PREFILLED_SYRINGE | Freq: Once | INTRAMUSCULAR | Status: AC
  Administered 2023-03-30: 50 ug via INTRAVENOUS
  Filled 2023-03-30: qty 1

## 2023-03-30 NOTE — ED Notes (Signed)
Xray at BS 

## 2023-03-30 NOTE — ED Notes (Signed)
Ortho tech at BS 

## 2023-03-30 NOTE — ED Notes (Signed)
EDP into see 

## 2023-03-30 NOTE — Progress Notes (Signed)
Orthopedic Tech Progress Note Patient Details:  Valerie Heath June 22, 1951 [id number]  Well-padded short leg splint with stirrup placed to RLE. Encouraged ice and elevation for pain/swelling. Pt is aware of needing to remain NWB.  Ortho Devices Type of Ortho Device: Post (short leg) splint, Stirrup splint Ortho Device/Splint Location: RLE Ortho Device/Splint Interventions: Ordered, Application, Adjustment   Post Interventions Patient Tolerated: Well Instructions Provided: Care of device, Adjustment of device  Valerie Heath [name] 03/30/2023, 7:34 PM
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
## 2023-03-30 NOTE — ED Notes (Addendum)
Pt in CT.

## 2023-03-30 NOTE — ED Notes (Signed)
Xrays done, "Feel better", pending CT.
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
## 2023-03-30 NOTE — Discharge Instructions (Addendum)
You should get seen by the orthopedic doctor this week.  This should be either Dr. Victorino Dike or Dr. Odis Hollingshead.  The contact information is below.  Call in the morning to set up that appointment.  Avoid putting weight onto your right ankle.  Elevate when possible.  Take ibuprofen and Tylenol for pain.  A prescription for narcotic pain medication was sent to your pharmacy.  Take only as needed.

## 2023-04-01 ENCOUNTER — Other Ambulatory Visit: Payer: Self-pay | Admitting: Orthopaedic Surgery

## 2023-04-01 DIAGNOSIS — S82841A Displaced bimalleolar fracture of right lower leg, initial encounter for closed fracture: Secondary | ICD-10-CM

## 2023-04-02 ENCOUNTER — Other Ambulatory Visit: Payer: 59

## 2023-04-02 DIAGNOSIS — S82841A Displaced bimalleolar fracture of right lower leg, initial encounter for closed fracture: Secondary | ICD-10-CM

## 2023-04-09 HISTORY — PX: ANKLE FRACTURE SURGERY: SHX122
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
## 2023-04-25 ENCOUNTER — Ambulatory Visit: Payer: 59 | Admitting: Obstetrics and Gynecology

## 2023-04-25 ENCOUNTER — Encounter: Payer: Self-pay | Admitting: Obstetrics and Gynecology

## 2023-04-25 VITALS — BP 122/78 | HR 87 | Ht 64.0 in

## 2023-04-25 DIAGNOSIS — N6320 Unspecified lump in the left breast, unspecified quadrant: Secondary | ICD-10-CM
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
## 2023-06-03 ENCOUNTER — Telehealth: Payer: Self-pay | Admitting: Obstetrics and Gynecology

## 2023-06-03 ENCOUNTER — Encounter: Payer: Self-pay | Admitting: Obstetrics and Gynecology

## 2023-06-03 ENCOUNTER — Ambulatory Visit: Payer: 59 | Admitting: Obstetrics and Gynecology

## 2023-06-03 VITALS — BP 136/80 | HR 65

## 2023-06-03 DIAGNOSIS — Z853 Personal history of malignant neoplasm of breast: Secondary | ICD-10-CM

## 2023-06-03 DIAGNOSIS — N6320 Unspecified lump in the left breast, unspecified quadrant: Secondary | ICD-10-CM

## 2023-06-03 NOTE — Telephone Encounter (Signed)
IMG orders placed.  Spoke with [place] at [facility].  Scheduled for Left Breast Dx MMG and [place] on 06/27/23 at 1000.   MyChart message to patient.
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
## 2023-06-04 NOTE — Telephone Encounter (Signed)
Call placed to patient, left detailed message, ok per dpr. Advised of appt as seen below. If you need to make any changes to this appt contact [facility] directly at [phone]. If you have any additional questions return call to the office at [phone], option 4.   Routing to provider for final review. Patient is agreeable to disposition. Will close encounter.

## 2023-06-27 ENCOUNTER — Ambulatory Visit
Admission: RE | Admit: 2023-06-27 | Discharge: 2023-06-27 | Disposition: A | Discharge status: Home / Self Care | Payer: 59 | Source: Ambulatory Visit | Attending: Obstetrics and Gynecology | Admitting: Obstetrics and Gynecology

## 2023-06-27 DIAGNOSIS — N6320 Unspecified lump in the left breast, unspecified quadrant: Secondary | ICD-10-CM

## 2023-06-27 DIAGNOSIS — Z853 Personal history of malignant neoplasm of breast: Secondary | ICD-10-CM
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
## 2023-07-24 ENCOUNTER — Inpatient Hospital Stay: Payer: 59 | Attending: Hematology and Oncology | Admitting: Hematology and Oncology

## 2023-07-24 VITALS — BP 166/84 | HR 74 | Temp 97.3°F | Resp 18 | Ht 64.0 in | Wt 152.2 lb

## 2023-07-24 DIAGNOSIS — Z170 Estrogen receptor positive status [ER+]: Secondary | ICD-10-CM

## 2023-07-24 DIAGNOSIS — Z923 Personal history of irradiation: Secondary | ICD-10-CM | POA: Insufficient documentation

## 2023-07-24 DIAGNOSIS — N641 Fat necrosis of breast: Secondary | ICD-10-CM | POA: Insufficient documentation

## 2023-07-24 DIAGNOSIS — Z7982 Long term (current) use of aspirin: Secondary | ICD-10-CM | POA: Insufficient documentation

## 2023-07-24 DIAGNOSIS — S82891A Other fracture of right lower leg, initial encounter for closed fracture: Secondary | ICD-10-CM | POA: Insufficient documentation

## 2023-07-24 DIAGNOSIS — Z853 Personal history of malignant neoplasm of breast: Secondary | ICD-10-CM | POA: Insufficient documentation

## 2023-07-24 DIAGNOSIS — C50412 Malignant neoplasm of upper-outer quadrant of left female breast: Secondary | ICD-10-CM | POA: Diagnosis not present
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
## 2023-07-24 NOTE — Assessment & Plan Note (Signed)
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 Oncotype DX recurrence score was 8 which translates into a 6% risk of distant recurrence  Status post radiation therapy completed November 2015, started anastrozole 08/02/2014 completed October 2022   Breast Cancer Surveillance: 1. Breast exam 07/23/2022:  Benign.  Radiation related skin damage in the left breast which is chronic and unchanged. 2. Mammogram and ultrasound 06/27/2023: Fat necrosis in the left breast: Benign breast density category C    Patient would like to see [place] once a year for follow-ups.

## 2023-08-06 ENCOUNTER — Encounter: Payer: Self-pay | Admitting: Obstetrics and Gynecology

## 2023-08-06 ENCOUNTER — Other Ambulatory Visit (HOSPITAL_COMMUNITY)
Admission: RE | Admit: 2023-08-06 | Discharge: 2023-08-06 | Disposition: A | Discharge status: Home / Self Care | Payer: 59 | Source: Ambulatory Visit | Attending: Obstetrics and Gynecology | Admitting: Obstetrics and Gynecology

## 2023-08-06 ENCOUNTER — Ambulatory Visit: Payer: 59 | Admitting: Obstetrics and Gynecology

## 2023-08-06 VITALS — BP 126/74 | HR 74 | Ht 64.5 in | Wt 150.0 lb

## 2023-08-06 DIAGNOSIS — L900 Lichen sclerosus et atrophicus: Secondary | ICD-10-CM

## 2023-08-06 DIAGNOSIS — Z124 Encounter for screening for malignant neoplasm of cervix: Secondary | ICD-10-CM

## 2023-08-06 DIAGNOSIS — R102 Pelvic and perineal pain: Secondary | ICD-10-CM | POA: Diagnosis not present

## 2023-08-06 DIAGNOSIS — Z01419 Encounter for gynecological examination (general) (routine) without abnormal findings: Secondary | ICD-10-CM | POA: Diagnosis not present

## 2023-08-06 DIAGNOSIS — Z5181 Encounter for therapeutic drug level monitoring: Secondary | ICD-10-CM | POA: Diagnosis not present

## 2023-08-06 MED ORDER — CLOBETASOL PROPIONATE 0.05 % EX OINT: TOPICAL_OINTMENT | CUTANEOUS | 1 refills | Status: DC

## 2023-08-06 NOTE — Patient Instructions (Signed)

## 2023-08-07 LAB — CYTOLOGY - PAP
Comment: NEGATIVE
Diagnosis: NEGATIVE
High risk HPV: NEGATIVE

## 2023-08-08 ENCOUNTER — Telehealth: Payer: Self-pay

## 2023-08-08 DIAGNOSIS — R102 Pelvic and perineal pain: Secondary | ICD-10-CM

## 2023-08-08 LAB — URINALYSIS, COMPLETE W/RFL CULTURE
Bacteria, UA: NONE SEEN /[HPF]
Bilirubin Urine: NEGATIVE
Glucose, UA: NEGATIVE
Hyaline Cast: NONE SEEN /[LPF]
Ketones, ur: NEGATIVE
Leukocyte Esterase: NEGATIVE
Nitrites, Initial: NEGATIVE
Protein, ur: NEGATIVE
Specific Gravity, Urine: 1.01 (ref 1.001–1.035)
pH: 5.5 (ref 5.0–8.0)

## 2023-08-08 LAB — URINE CULTURE
MICRO NUMBER:: 15725137
Result:: NO GROWTH
SPECIMEN QUALITY:: ADEQUATE

## 2023-08-08 LAB — CULTURE INDICATED

## 2023-08-08 NOTE — Telephone Encounter (Signed)
Pt LVM in triage line inquiring about UA/Ucx results from visit on 08/06/2023-pt reports specifically noticing the RBCs and HGB on UA.  Please advise.

## 2023-08-08 NOTE — Telephone Encounter (Signed)
Patient's urinalysis is showing microscopic hematuria (blood).  The final urine culture is negative for infection.   I recommend she return to the office for a follow up visit with me.  We need to recheck her urine again for blood, preferably by doing a catheter specimen collection.   If the blood in the urine is confirmed, I would have her do a consultation with urology for evaluation to determine the cause.    If her pelvic pressure continues, I recommend a pelvic ultrasound.

## 2023-08-11 NOTE — Telephone Encounter (Signed)
Spoke with patient, advised per Dr. Edward Jolly. Patient is currently out of town, returning next week. Pelvic pressure is intermittent, patient agreeable to schedule PUS.  OV scheduled for 11/26 at 1400 with Dr. Edward Jolly.  PUS scheduled for 09/09/23 at 1130, returning at 1400 for consult.   PUS order placed.   Routing to provider for final review. Patient is agreeable to disposition. Will close encounter.

## 2023-08-18 NOTE — Progress Notes (Unsigned)
GYNECOLOGY  VISIT   HPI: 72 y.o.   Married  Caucasian female   G0P0 with No LMP recorded. Patient is postmenopausal.   here for: cath UA for microscopic hematuria.   Seen on 08/06/23, and patient reported pelvic heaviness.    Her urinalysis showed 1+ hgb and 10 - 20 RBCs.  Her UC was negative.    Denies dysuria.  Pelvic US scheduled 09/09/23.  GYNECOLOGIC HISTORY: No LMP recorded. Patient is postmenopausal. Contraception:  PMP Menopausal hormone therapy:  n/a Last 2 paps: 08/06/23 neg: HR HPV neg, 07/04/20 WNL  History of abnormal Pap or positive HPV:  no Mammogram:  06/27/23 Breast Density Cat C, BI-RADS CAT 2 benign         OB History     Gravida  0   Para      Term      Preterm      AB      Living         SAB      IAB      Ectopic      Multiple      Live Births                 Patient Active Problem List   Diagnosis Date Noted   Retinal detachment of right eye with single break 01/14/2022   Cystoid macular edema of right eye 11/15/2021   Left epiretinal membrane 09/06/2021   Epiretinal membrane, right eye 05/08/2021   Pseudophakia, right eye 03/08/2021   Traction retinal detachment involving macula, right 02/28/2021   Postoperative follow-up 02/24/2021   Lattice degeneration, 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   Cataract, nuclear sclerotic, 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   Posterior vitreous detachment of left eye 09/26/2020   Lattice degeneration of retina, 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   Pain in right knee 03/02/2018   Breast cancer of upper-outer quadrant of right 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) 03/29/2014   Breast cancer of upper-outer quadrant of left 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) 03/29/2014   Atypical lobular hyperplasia of left breast 01/25/2014    Past Medical History:  Diagnosis Date   Breast cancer (HCC)    Breast cancer of upper-outer quadrant of left 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)    Breast cancer of upper-outer quadrant of right 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)    Cancer (HCC)    breast cancer-lt    Fibroid    Hypothyroid    Personal history of radiation therapy    Posterior vitreous detachment of 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   Radiation 06/16/14-08/01/14   left and right breast    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 02/22/2021   Retinal detachment via scleral buckle, cryopexy external drainage subretinal fluid and injection of temporary gas  02/23/2021   right eye    Past Surgical History:  Procedure Laterality Date   ANKLE FRACTURE SURGERY Right 04/09/2023   BREAST BIOPSY     BREAST LUMPECTOMY Bilateral    2015   BREAST LUMPECTOMY WITH RADIOACTIVE SEED LOCALIZATION Left 02/21/2014   Procedure: BREAST LUMPECTOMY WITH RADIOACTIVE SEED LOCALIZATION;  Surgeon: Ernestene Mention, MD;  Location: [facility];  Service: General;  Laterality: Left;   BREAST SURGERY Right 04/2014   Lumpectomy    BREAST SURGERY Left    COLONOSCOPY     LYMPH NODE DISSECTION  04/27/2014   right and left   MYOMECTOMY     REFRACTIVE SURGERY     X 2   SHOULDER SURGERY  2005   left  TONSILLECTOMY AND ADENOIDECTOMY      Current Outpatient Medications  Medication Sig Dispense Refill   b complex vitamins capsule Take 1 capsule by mouth daily.     Cholecalciferol (VITAMIN D) 50 MCG (2000 UT) CAPS Take 1 capsule (2,000 Units total) by mouth daily. 30 capsule    clobetasol ointment (TEMOVATE) 0.05 % Use a pea sized amount topically BID for up to 1-2 weeks as needed for a flare. Not for daily long term use.  May use twice weekly at bedtime as needed. 60 g 1   levothyroxine (SYNTHROID) 125 MCG tablet Take 1 tablet (125 mcg total) by mouth daily before breakfast.     valACYclovir (VALTREX) 500 MG tablet TAKE 4 TABLETS BY MOUTH ON START OF UCLERATION, THEN 4 TABLETS 12 HOURS LATER  3   No current facility-administered medications for this visit.     ALLERGIES: Erythromycin  Family History  Problem Relation Age of Onset   Hypertension Mother    Breast cancer Mother 58   Cancer Mother 33        breast cancer at 33 ; Fallopian tube tumor in her 30s   Diabetes Father    Hypertension Father    Heart disease Father    Prostate cancer Father    Cancer Father 43       prostate cancer   Colon cancer Paternal Uncle    Cancer Paternal Uncle 43       colon cancer   Throat cancer Maternal Uncle    Cancer Maternal Uncle 45       prostate cancer   Prostate cancer Maternal Uncle    Breast cancer Maternal Aunt 41   Leukemia Paternal Grandfather    Leukemia Maternal Aunt    Cancer Paternal Uncle 74       brain cancer   Brain cancer Paternal Uncle     Social History   Socioeconomic History   Marital status: Married    Spouse name: Not on file   Number of children: 0   Years of education: Not on file   Highest education level: Not on file  Occupational History   Occupation: retired - Theatre stage manager for [employer]  Tobacco Use   Smoking status: Never   Smokeless tobacco: Never  Vaping Use   Vaping status: Never Used  Substance and Sexual Activity   Alcohol use: Yes    Alcohol/week: 2.0 standard drinks of alcohol    Types: 2 Standard drinks or equivalent per week    Comment: Socially   Drug use: No   Sexual activity: Yes    Birth control/protection: Post-menopausal    Comment: 1st intercourse 72 yo-Fewer than 5 partners  Other Topics Concern   Not on file  Social History Narrative   Not on file   Social Determinants of Health   Financial Resource Strain: Not on file  Food Insecurity: Not on file  Transportation Needs: Not on file  Physical Activity: Not on file  Stress: Not on file  Social Connections: Not on file  Intimate Partner Violence: Not on file    Review of Systems  All other systems reviewed and are negative.  PHYSICAL EXAMINATION:   BP 136/80 (BP Location: Left Arm, Patient Position: Sitting, Cuff Size: Normal)   Pulse 77   Ht 5' 4.5" (1.638 m)   Wt 150 lb (68 kg)   SpO2 99%   BMI 25.35 kg/m     General appearance: alert, cooperative and  appears stated age  Pelvic: External genitalia:  no lesions              Urethra:  normal appearing urethra with no masses, tenderness or lesions         I and O catheter Verbal consent.  Sterile prep with betadine.  Small urethral orifice. Cath tip passed.  Clear yellow urine obtained, about 200 cc.  No complications.   Chaperone was present for exam:  Warren Lacy, CMA  ASSESSMENT:  Microscopic hematuria.  Pelvic pressure.   PLAN:  Urinalysis:  sg 1.015, ph 7.0, neg blood, NS RBC, NS WBC, NS bacteria.   No reflex culture.  Return for pelvic US in December.   20 min  total time was spent for this patient encounter, including preparation, face-to-face counseling with the patient, coordination of care, and documentation of the encounter.

## 2023-08-19 ENCOUNTER — Encounter: Payer: Self-pay | Admitting: Obstetrics and Gynecology

## 2023-08-19 ENCOUNTER — Ambulatory Visit: Payer: 59 | Admitting: Obstetrics and Gynecology

## 2023-08-19 VITALS — BP 136/80 | HR 77 | Ht 64.5 in | Wt 150.0 lb

## 2023-08-19 DIAGNOSIS — R102 Pelvic and perineal pain: Secondary | ICD-10-CM

## 2023-08-19 DIAGNOSIS — R3129 Other microscopic hematuria: Secondary | ICD-10-CM

## 2023-08-19 LAB — URINALYSIS, COMPLETE W/RFL CULTURE
Bacteria, UA: NONE SEEN /[HPF]
Bilirubin Urine: NEGATIVE
Casts: NONE SEEN /[LPF]
Crystals: NONE SEEN /[HPF]
Glucose, UA: NEGATIVE
Hgb urine dipstick: NEGATIVE
Ketones, ur: NEGATIVE
Leukocyte Esterase: NEGATIVE
Nitrites, Initial: NEGATIVE
Protein, ur: NEGATIVE
RBC / HPF: NONE SEEN /[HPF] (ref 0–2)
Specific Gravity, Urine: 1.015 (ref 1.001–1.035)
WBC, UA: NONE SEEN /[HPF] (ref 0–5)
Yeast: NONE SEEN /[HPF]
pH: 7 (ref 5.0–8.0)

## 2023-08-19 LAB — NO CULTURE INDICATED

## 2023-08-26 NOTE — Progress Notes (Signed)
GYNECOLOGY  VISIT   HPI: 72 y.o.   Married  Caucasian female   G0P0 with No LMP recorded. Patient is postmenopausal.   here for: U/S consult  for pelvic pressure and heaviness.  Hx myomectomy.   Takes a stool softener daily for constipation.  [employer] also.   Likes Diet Pepsi with caffeine.  Not drinking a lot of water.   GYNECOLOGIC HISTORY: No LMP recorded. Patient is postmenopausal. Contraception:  PMP Menopausal hormone therapy:  n/a Last 2 paps:  07/04/20 WNL History of abnormal Pap or positive HPV:  no Mammogram:  06/27/23 Breast Density Cat C, BI-RADS CAT 2 benign         OB History     Gravida  0   Para      Term      Preterm      AB      Living         SAB      IAB      Ectopic      Multiple      Live Births                 Patient Active Problem List   Diagnosis Date Noted   Retinal detachment of right eye with single break 01/14/2022   Cystoid macular edema of right eye 11/15/2021   Left epiretinal membrane 09/06/2021   Epiretinal membrane, right eye 05/08/2021   Pseudophakia, right eye 03/08/2021   Traction retinal detachment involving macula, right 02/28/2021   Postoperative follow-up 02/24/2021   Lattice degeneration, 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   Cataract, nuclear sclerotic, 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   Posterior vitreous detachment of left eye 09/26/2020   Lattice degeneration of retina, 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   Pain in right knee 03/02/2018   Breast cancer of upper-outer quadrant of right 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) 03/29/2014   Breast cancer of upper-outer quadrant of left 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) 03/29/2014   Atypical lobular hyperplasia of left breast 01/25/2014    Past Medical History:  Diagnosis Date   Breast cancer (HCC)    Breast cancer of upper-outer quadrant of left 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)    Breast cancer of upper-outer quadrant of right 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)    Cancer (HCC)    breast cancer-lt   Fibroid    Hypothyroid    Personal history  of radiation therapy    Posterior vitreous detachment of 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   Radiation 06/16/14-08/01/14   left and right breast    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 02/22/2021   Retinal detachment via scleral buckle, cryopexy external drainage subretinal fluid and injection of temporary gas  02/23/2021   right eye    Past Surgical History:  Procedure Laterality Date   ANKLE FRACTURE SURGERY Right 04/09/2023   BREAST BIOPSY     BREAST LUMPECTOMY Bilateral    2015   BREAST LUMPECTOMY WITH RADIOACTIVE SEED LOCALIZATION Left 02/21/2014   Procedure: BREAST LUMPECTOMY WITH RADIOACTIVE SEED LOCALIZATION;  Surgeon: Ernestene Mention, MD;  Location: [facility];  Service: General;  Laterality: Left;   BREAST SURGERY Right 04/2014   Lumpectomy    BREAST SURGERY Left    COLONOSCOPY     LYMPH NODE DISSECTION  04/27/2014   right and left   MYOMECTOMY     REFRACTIVE SURGERY     X 2   SHOULDER SURGERY  2005   left   TONSILLECTOMY AND ADENOIDECTOMY      Current  Outpatient Medications  Medication Sig Dispense Refill   b complex vitamins capsule Take 1 capsule by mouth daily.     Cholecalciferol (VITAMIN D) 50 MCG (2000 UT) CAPS Take 1 capsule (2,000 Units total) by mouth daily. 30 capsule    clobetasol ointment (TEMOVATE) 0.05 % Use a pea sized amount topically BID for up to 1-2 weeks as needed for a flare. Not for daily long term use.  May use twice weekly at bedtime as needed. 60 g 1   levothyroxine (SYNTHROID) 125 MCG tablet Take 1 tablet (125 mcg total) by mouth daily before breakfast.     valACYclovir (VALTREX) 500 MG tablet TAKE 4 TABLETS BY MOUTH ON START OF UCLERATION, THEN 4 TABLETS 12 HOURS LATER  3   No current facility-administered medications for this visit.     ALLERGIES: Erythromycin  Family History  Problem Relation Age of Onset   Hypertension Mother    Breast cancer Mother 68   Cancer Mother 37       breast cancer at 78 ; Fallopian tube  tumor in her 22s   Diabetes Father    Hypertension Father    Heart disease Father    Prostate cancer Father    Cancer Father 75       prostate cancer   Colon cancer Paternal Uncle    Cancer Paternal Uncle 41       colon cancer   Throat cancer Maternal Uncle    Cancer Maternal Uncle 45       prostate cancer   Prostate cancer Maternal Uncle    Breast cancer Maternal Aunt 98   Leukemia Paternal Grandfather    Leukemia Maternal Aunt    Cancer Paternal Uncle 15       brain cancer   Brain cancer Paternal Uncle     Social History   Socioeconomic History   Marital status: Married    Spouse name: Not on file   Number of children: 0   Years of education: Not on file   Highest education level: Not on file  Occupational History   Occupation: retired - Theatre stage manager for [employer]  Tobacco Use   Smoking status: Never   Smokeless tobacco: Never  Vaping Use   Vaping status: Never Used  Substance and Sexual Activity   Alcohol use: Yes    Alcohol/week: 2.0 standard drinks of alcohol    Types: 2 Standard drinks or equivalent per week    Comment: Socially   Drug use: No   Sexual activity: Yes    Birth control/protection: Post-menopausal    Comment: 1st intercourse 72 yo-Fewer than 5 partners  Other Topics Concern   Not on file  Social History Narrative   Not on file   Social Drivers of Health   Financial Resource Strain: Not on file  Food Insecurity: Not on file  Transportation Needs: Not on file  Physical Activity: Not on file  Stress: Not on file  Social Connections: Not on file  Intimate Partner Violence: Not on file    Review of Systems  All other systems reviewed and are negative.   PHYSICAL EXAMINATION:   BP 124/76 (BP Location: Left Arm, Patient Position: Sitting, Cuff Size: Small)   Pulse 86   Ht 5' 4.5" (1.638 m)   Wt 152 lb (68.9 kg)   SpO2 98%   BMI 25.69 kg/m     General appearance: alert, cooperative and appears stated age   Pelvic US  Uterus 4.82  x 3.15  x 1.93 cm.  No myometrial masses.  EMS 1.61 mm.  Symmetrical.  No masses.   Left ovary 1.51 x 0.95 x 1.02 cm.  Right ovary 1.43 x 0.85 x 0.90 cm.  No adnexal masses.  No free fluid.   ASSESSMENT:  Pelvic pressure.   Normal pelvic US.  Microscopic hematuria, resolved on cath urinalysis.  Hx myomectomy.  Constipation.  Caffeine use.   PLAN:  Pelvic US images and report reviewed.  Reassurance given.  Caffeine reduction reviewed as a way to improve hydration and reduce some constipation symptoms.  She has follow up with her PCP toward the beginning of 2025.  Fu prn.   20 min  total time was spent for this patient encounter, including preparation, face-to-face counseling with the patient, coordination of care, and documentation of the encounter.

## 2023-09-09 ENCOUNTER — Encounter: Payer: Self-pay | Admitting: Obstetrics and Gynecology

## 2023-09-09 ENCOUNTER — Ambulatory Visit (INDEPENDENT_AMBULATORY_CARE_PROVIDER_SITE_OTHER): Payer: 59 | Admitting: Obstetrics and Gynecology

## 2023-09-09 ENCOUNTER — Ambulatory Visit (INDEPENDENT_AMBULATORY_CARE_PROVIDER_SITE_OTHER): Payer: 59

## 2023-09-09 VITALS — BP 124/76 | HR 86 | Ht 64.5 in | Wt 152.0 lb

## 2023-09-09 DIAGNOSIS — R102 Pelvic and perineal pain: Secondary | ICD-10-CM

## 2024-01-13 ENCOUNTER — Other Ambulatory Visit: Payer: Self-pay | Admitting: Obstetrics and Gynecology

## 2024-01-13 DIAGNOSIS — Z1231 Encounter for screening mammogram for malignant neoplasm of breast: Secondary | ICD-10-CM

## 2024-03-01 ENCOUNTER — Ambulatory Visit
Admission: RE | Admit: 2024-03-01 | Discharge: 2024-03-01 | Disposition: A | Discharge status: Home / Self Care | Source: Ambulatory Visit | Attending: Obstetrics and Gynecology | Admitting: Obstetrics and Gynecology

## 2024-03-01 DIAGNOSIS — Z1231 Encounter for screening mammogram for malignant neoplasm of breast: Secondary | ICD-10-CM

## 2024-03-07 ENCOUNTER — Ambulatory Visit: Payer: Self-pay | Admitting: Obstetrics and Gynecology

## 2024-07-26 ENCOUNTER — Inpatient Hospital Stay: Payer: 59 | Attending: Hematology and Oncology | Admitting: Hematology and Oncology

## 2024-07-26 VITALS — BP 160/90 | HR 82 | Temp 98.1°F | Resp 18 | Ht 65.0 in | Wt 155.2 lb

## 2024-07-26 DIAGNOSIS — N641 Fat necrosis of breast: Secondary | ICD-10-CM | POA: Insufficient documentation

## 2024-07-26 DIAGNOSIS — Z923 Personal history of irradiation: Secondary | ICD-10-CM | POA: Diagnosis not present

## 2024-07-26 DIAGNOSIS — C50411 Malignant neoplasm of upper-outer quadrant of right female breast: Secondary | ICD-10-CM

## 2024-07-26 DIAGNOSIS — C50412 Malignant neoplasm of upper-outer quadrant of left female breast: Secondary | ICD-10-CM

## 2024-07-26 DIAGNOSIS — Z853 Personal history of malignant neoplasm of breast: Secondary | ICD-10-CM | POA: Insufficient documentation

## 2024-07-26 DIAGNOSIS — Z170 Estrogen receptor positive status [ER+]: Secondary | ICD-10-CM | POA: Diagnosis not present
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
## 2024-07-28 ENCOUNTER — Telehealth: Payer: Self-pay

## 2024-07-28 NOTE — Telephone Encounter (Signed)
 Per md orders placed for [employer] through their portal.

## 2024-08-09 ENCOUNTER — Ambulatory Visit: Payer: 59 | Admitting: Obstetrics and Gynecology

## 2024-08-16 ENCOUNTER — Encounter: Payer: Self-pay | Admitting: Obstetrics and Gynecology

## 2024-08-16 ENCOUNTER — Ambulatory Visit (INDEPENDENT_AMBULATORY_CARE_PROVIDER_SITE_OTHER): Payer: 59 | Admitting: Obstetrics and Gynecology

## 2024-08-16 VITALS — BP 124/84 | HR 80 | Ht 65.5 in | Wt 156.0 lb

## 2024-08-16 DIAGNOSIS — Z5181 Encounter for therapeutic drug level monitoring: Secondary | ICD-10-CM | POA: Diagnosis not present

## 2024-08-16 DIAGNOSIS — L900 Lichen sclerosus et atrophicus: Secondary | ICD-10-CM

## 2024-08-16 DIAGNOSIS — Z01419 Encounter for gynecological examination (general) (routine) without abnormal findings: Secondary | ICD-10-CM

## 2024-08-16 DIAGNOSIS — C50411 Malignant neoplasm of upper-outer quadrant of right female breast: Secondary | ICD-10-CM | POA: Diagnosis not present

## 2024-08-16 MED ORDER — CLOBETASOL PROPIONATE 0.05 % EX OINT: TOPICAL_OINTMENT | CUTANEOUS | 1 refills | Status: AC
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
## 2024-08-16 NOTE — Patient Instructions (Signed)

## 2024-09-14 ENCOUNTER — Encounter: Payer: Self-pay | Admitting: Hematology and Oncology

## 2024-09-24 NOTE — Progress Notes (Signed)
 "  GYNECOLOGY  VISIT   HPI: 74 y.o.   Married  Caucasian female   G0P0000 with No LMP recorded. Patient is postmenopausal.   here for: Vulvar biopsy   Has been using clobetasol  ointment, twice a day for 2 weeks and then weekly. The area feels better with less discomfort.        Asking about the mole on her bottom as well.  She states she has a diagnosis of melanoma.   GYNECOLOGIC HISTORY: No LMP recorded. Patient is postmenopausal. Contraception:  PMP Menopausal hormone therapy:  n/a Last 2 paps:  08/06/23 neg, HR HPV neg, 07/04/20 neg  History of abnormal Pap or positive HPV:  no Mammogram:  03/01/24 Breast Density Cat C, BIRADS Cat 1 neg         OB History     Gravida  0   Para  0   Term  0   Preterm  0   AB  0   Living  0      SAB  0   IAB  0   Ectopic  0   Multiple  0   Live Births  0              Patient Active Problem List   Diagnosis Date Noted   Retinal detachment of right eye with single break 01/14/2022   Cystoid macular edema of right eye 11/15/2021   Left epiretinal membrane 09/06/2021   Epiretinal membrane, right eye 05/08/2021   Pseudophakia, right eye 03/08/2021   Traction retinal detachment involving macula, right 02/28/2021   Postoperative follow-up 02/24/2021   Lattice degeneration, 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   Cataract, nuclear sclerotic, 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   Posterior vitreous detachment of left eye 09/26/2020   Lattice degeneration of retina, 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   Pain in right knee 03/02/2018   Breast cancer of upper-outer quadrant of right 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) 03/29/2014   Breast cancer of upper-outer quadrant of left 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) 03/29/2014   Atypical lobular hyperplasia of left breast 01/25/2014    Past Medical History:  Diagnosis Date   Breast cancer (HCC)    Breast cancer of upper-outer quadrant of left 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)    Breast cancer of upper-outer quadrant of right 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)    Cancer (HCC)     breast cancer-lt   Fibroid    Hypothyroid    Personal history of radiation therapy    Posterior vitreous detachment of 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   Radiation 06/16/14-08/01/14   left and right breast    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 02/22/2021   Retinal detachment via scleral buckle, cryopexy external drainage subretinal fluid and injection of temporary gas  02/23/2021   right eye    Past Surgical History:  Procedure Laterality Date   ANKLE FRACTURE SURGERY Right 04/09/2023   BREAST BIOPSY     BREAST LUMPECTOMY Bilateral    2015   BREAST LUMPECTOMY WITH RADIOACTIVE SEED LOCALIZATION Left 02/21/2014   Procedure: BREAST LUMPECTOMY WITH RADIOACTIVE SEED LOCALIZATION;  Surgeon: Elon CHRISTELLA Pacini, MD;  Location: [facility];  Service: General;  Laterality: Left;   BREAST SURGERY Right 04/2014   Lumpectomy    BREAST SURGERY Left    COLONOSCOPY     LYMPH NODE DISSECTION  04/27/2014   right and left   MYOMECTOMY     REFRACTIVE SURGERY     X 2   SHOULDER SURGERY  2005   left  TONSILLECTOMY AND ADENOIDECTOMY      Current Outpatient Medications  Medication Sig Dispense Refill   b complex vitamins capsule Take 1 capsule by mouth daily.     Cholecalciferol (VITAMIN D ) 50 MCG (2000 UT) CAPS Take 1 capsule (2,000 Units total) by mouth daily. 30 capsule    clobetasol  ointment (TEMOVATE ) 0.05 % Use a pea sized amount topically BID for 2 weeks as needed for a flare. Not for daily long term use.  May use twice weekly at bedtime as needed. 60 g 1   levothyroxine  (SYNTHROID ) 125 MCG tablet Take 1 tablet (125 mcg total) by mouth daily before breakfast.     No current facility-administered medications for this visit.     ALLERGIES: Erythromycin  Family History  Problem Relation Age of Onset   Hypertension Mother    Breast cancer Mother 22   Cancer Mother 14       breast cancer at 27 ; Fallopian tube tumor in her 56s   Diabetes Father    Hypertension Father     Heart disease Father    Prostate cancer Father    Cancer Father 78       prostate cancer   Colon cancer Paternal Uncle    Cancer Paternal Uncle 70       colon cancer   Throat cancer Maternal Uncle    Cancer Maternal Uncle 45       prostate cancer   Prostate cancer Maternal Uncle    Breast cancer Maternal Aunt 84   Leukemia Paternal Grandfather    Leukemia Maternal Aunt    Cancer Paternal Uncle 78       brain cancer   Brain cancer Paternal Uncle     Social History   Socioeconomic History   Marital status: Married    Spouse name: Not on file   Number of children: 0   Years of education: Not on file   Highest education level: Not on file  Occupational History   Occupation: retired - theatre stage manager for [employer]  Tobacco Use   Smoking status: Never   Smokeless tobacco: Never  Vaping Use   Vaping status: Never Used  Substance and Sexual Activity   Alcohol use: Yes    Alcohol/week: 2.0 standard drinks of alcohol    Types: 2 Standard drinks or equivalent per week    Comment: Socially   Drug use: No   Sexual activity: Yes    Birth control/protection: Post-menopausal    Comment: 1st intercourse 74 yo-Fewer than 5 partners, no abnormal pap, no cancer, no std, no des  Other Topics Concern   Not on file  Social History Narrative   Not on file   Social Drivers of Health   Tobacco Use: Low Risk (09/27/2024)   Patient History    Smoking Tobacco Use: Never    Smokeless Tobacco Use: Never    Passive Exposure: Not on file  Financial Resource Strain: Not on file  Food Insecurity: Not on file  Transportation Needs: Not on file  Physical Activity: Not on file  Stress: Not on file  Social Connections: Not on file  Intimate Partner Violence: Not on file  Depression (EYV7-0): Not on file  Alcohol Screen: Not on file  Housing: Not on file  Utilities: Not on file  Health Literacy: Not on file    Review of Systems  All other systems reviewed and are negative.   PHYSICAL  EXAMINATION:   BP 124/82 (BP Location: Left Arm, Patient Position:  Sitting)   Pulse 78   SpO2 98%     General appearance: alert, cooperative and appears stated age Head: Normocephalic, without obvious abnormality, atraumatic Neck: no adenopathy, supple, symmetrical, trachea midline and thyroid normal to inspection and palpation Lungs: clear to auscultation bilaterally Breasts: normal appearance, no masses or tenderness, No nipple retraction or dimpling, No nipple discharge or bleeding, No axillary or supraclavicular adenopathy Heart: regular rate and rhythm Abdomen: soft, non-tender, no masses,  no organomegaly Extremities: extremities normal, atraumatic, no cyanosis or edema Skin: Skin color, texture, turgor normal. No rashes or lesions Lymph nodes: Cervical, supraclavicular, and axillary nodes normal. No abnormal inguinal nodes palpated Neurologic: Grossly normal  Pelvic: External genitalia:  right labia majora with 3.5 mm brown pigmented flat nevus.   Perineum with hypopigmented skin.   Procedures:  vulvar biopsies.  Consent done.  Sterile prep with Hibiclens .  Local 1% lidocaine , lot [id number], exp December 21, 2025. 4 mm punch biopsy used to excise right vulvar pigmented nevus and also to do biopsy of perineum.  Each biopsy to pathology separately.  Single suture of 3/0 Vicryl to each biopsy site.  Minimal EBL.  No complications.    Chaperone was present for exam:  Kari HERO, CMA  ASSESSMENT:  Pigmented nevus of labia majora.  Vulvar lesion.  Most consistent with lichen sclerosus.   PLAN:  FU biopsy results.  Post biopsy care reviewed.  Anticipate restarting clobetasol  ointment after biopsy sites are healed.  FU prn.       "

## 2024-09-27 ENCOUNTER — Ambulatory Visit: Admitting: Obstetrics and Gynecology

## 2024-09-27 ENCOUNTER — Other Ambulatory Visit (HOSPITAL_COMMUNITY)
Admission: RE | Admit: 2024-09-27 | Discharge: 2024-09-27 | Disposition: A | Discharge status: Home / Self Care | Source: Ambulatory Visit | Attending: Obstetrics and Gynecology | Admitting: Obstetrics and Gynecology

## 2024-09-27 ENCOUNTER — Encounter: Payer: Self-pay | Admitting: Obstetrics and Gynecology

## 2024-09-27 VITALS — BP 124/82 | HR 78

## 2024-09-27 DIAGNOSIS — D229 Melanocytic nevi, unspecified: Secondary | ICD-10-CM | POA: Diagnosis not present

## 2024-09-27 DIAGNOSIS — N9089 Other specified noninflammatory disorders of vulva and perineum: Secondary | ICD-10-CM | POA: Diagnosis not present

## 2024-09-27 NOTE — Patient Instructions (Signed)
 Vulva Biopsy, Care After The following information offers guidance on how to care for yourself after your procedure. Your health care provider may also give you more specific instructions. If you have problems or questions, contact your health care provider. What can I expect after the procedure? After the procedure, it is common to have: Slight bleeding from the biopsy site. Slight pain or discomfort at the biopsy site. Follow these instructions at home: Biopsy site care  Follow instructions from your health care provider about how to take care of your biopsy site. Make sure you: Clean the area using water and mild soap twice a day or as told by your health care provider. Gently pat the area dry. You may shower 24 hours after the procedure. If you were prescribed an antibiotic ointment, apply it as told by your health care provider. Do not stop using the antibiotic even if your condition improves. If told by your health care provider, take a sitz bath to help with pain and discomfort. This is a warm water bath that you take while sitting down. Do this as often as told by your health care provider. The water should only come up to your hips and cover your buttocks. You may pat the area dry with a soft, clean towel. Leave stitches (sutures), skin glue, or adhesive strips in place. These skin closures may need to stay in place for 2 weeks or longer. If adhesive strip edges start to loosen and curl up, you may trim the loose edges. Do not remove adhesive strips completely unless your health care provider tells you to do that. Check your biopsy site every day for signs of infection. It may be helpful to use a handheld mirror to do this. Check for: Redness, swelling, or more pain. More fluid or blood. Warmth. Pus or a bad smell. Do not rub the biopsy area after urinating. Gently pat the area dry or use a bottle filled with warm water (peri bottle) to clean the area. Gently wipe from front to  back. Lifestyle Wear loose, cotton underwear. Do not wear tight pants. For at least 1 week or until your health care provider approves: Do not use tampons, douche, or put anything inside your vagina. Do not have sex. Until your health care provider approves: Do not exercise, such as running or biking. Do not swim or use a hot tub. General instructions Take over-the-counter and prescription medicines only as told by your health care provider. Drink enough fluid to keep your urine pale yellow. Use a sanitary napkin until the bleeding stops. If told, put ice on the biopsy site. To do this: Place ice in a plastic bag. Place a towel between your skin and the bag. Leave the ice on for 20 minutes, 2-3 times a day. Remove the ice if your skin turns bright red. This is very important. If you cannot feel pain, heat, or cold, you have a greater risk of damage to the area. Keep all follow-up visits. This is important. Contact a health care provider if: You have redness, swelling, or more pain around your biopsy site. You have more fluid or blood coming from your biopsy site. Your biopsy site feels warm to the touch. Your pain is not controlled with medicine or ice packs. You have a fever or chills. Get help right away if: You have heavy bleeding from the vulva. You have pus or a bad smell coming from the biopsy site. You have abdominal pain. Summary After the procedure, it  is common to have slight bleeding and discomfort at the biopsy site. Follow instructions from your health care provider after your biopsy. Take sitz baths as told by your health care provider to help with pain and discomfort. Leave any sutures in place. Contact your health care provider if you notice any signs of infection around the biopsy site, including redness, swelling, more pain, more fluid or blood, or warmth. Keep all follow-up visits. This is important. This information is not intended to replace advice given to you  by your health care provider. Make sure you discuss any questions you have with your health care provider. Document Revised: 05/29/2021 Document Reviewed: 05/29/2021 Elsevier Patient Education  2024 [employer].

## 2024-09-29 LAB — SURGICAL PATHOLOGY

## 2024-09-30 ENCOUNTER — Telehealth: Payer: Self-pay

## 2024-09-30 NOTE — Telephone Encounter (Signed)
 Patient states she had a a couple of biopsies done on 09-27-24. She said she received the pathology report. She wanted to know if Dr Nikki looks at them and then sends her a message regarding results. I assured her once Dr Nikki reviews it that she will let the patient know.

## 2024-10-01 ENCOUNTER — Ambulatory Visit: Payer: Self-pay | Admitting: Obstetrics and Gynecology

## 2024-10-01 NOTE — Telephone Encounter (Signed)
 Results of vulvar biopsy communicated now to patient through My Chart.   You may close this encounter after confirming patient has opened my note.  Bobie Cary, MD

## 2024-10-01 NOTE — Telephone Encounter (Signed)
 Seen by patient Valerie Heath on 10/01/2024  2:23 PM   Encounter closed.

## 2025-07-26 ENCOUNTER — Inpatient Hospital Stay: Admitting: Hematology and Oncology

## 2025-08-17 ENCOUNTER — Encounter: Admitting: Obstetrics and Gynecology
